# Patient Record
Sex: Female | Born: 1950 | Race: White | Hispanic: No | Marital: Married | Smoking: Former smoker
Health system: Southern US, Community
[De-identification: ages and names within clinical notes are randomized; demographics above are authoritative.]

## PROBLEM LIST (undated history)

## (undated) DIAGNOSIS — E785 Hyperlipidemia, unspecified: Secondary | ICD-10-CM

## (undated) DIAGNOSIS — J189 Pneumonia, unspecified organism: Secondary | ICD-10-CM

## (undated) DIAGNOSIS — I10 Essential (primary) hypertension: Secondary | ICD-10-CM

## (undated) DIAGNOSIS — R002 Palpitations: Secondary | ICD-10-CM

## (undated) DIAGNOSIS — R011 Cardiac murmur, unspecified: Secondary | ICD-10-CM

## (undated) DIAGNOSIS — K219 Gastro-esophageal reflux disease without esophagitis: Secondary | ICD-10-CM

## (undated) DIAGNOSIS — E669 Obesity, unspecified: Secondary | ICD-10-CM

## (undated) DIAGNOSIS — I712 Thoracic aortic aneurysm, without rupture, unspecified: Secondary | ICD-10-CM

## (undated) DIAGNOSIS — R7309 Other abnormal glucose: Secondary | ICD-10-CM

## (undated) DIAGNOSIS — G4700 Insomnia, unspecified: Secondary | ICD-10-CM

## (undated) DIAGNOSIS — I272 Pulmonary hypertension, unspecified: Secondary | ICD-10-CM

## (undated) DIAGNOSIS — M810 Age-related osteoporosis without current pathological fracture: Secondary | ICD-10-CM

## (undated) DIAGNOSIS — T7840XA Allergy, unspecified, initial encounter: Secondary | ICD-10-CM

## (undated) DIAGNOSIS — M7660 Achilles tendinitis, unspecified leg: Secondary | ICD-10-CM

## (undated) DIAGNOSIS — E559 Vitamin D deficiency, unspecified: Secondary | ICD-10-CM

## (undated) DIAGNOSIS — K589 Irritable bowel syndrome without diarrhea: Secondary | ICD-10-CM

## (undated) DIAGNOSIS — M1990 Unspecified osteoarthritis, unspecified site: Secondary | ICD-10-CM

## (undated) DIAGNOSIS — F419 Anxiety disorder, unspecified: Secondary | ICD-10-CM

## (undated) HISTORY — DX: Age-related osteoporosis without current pathological fracture: M81.0

## (undated) HISTORY — DX: Essential (primary) hypertension: I10

## (undated) HISTORY — DX: Irritable bowel syndrome, unspecified: K58.9

## (undated) HISTORY — DX: Anxiety disorder, unspecified: F41.9

## (undated) HISTORY — PX: TUBAL LIGATION: SHX77

## (undated) HISTORY — DX: Palpitations: R00.2

## (undated) HISTORY — DX: Thoracic aortic aneurysm, without rupture, unspecified: I71.20

## (undated) HISTORY — DX: Vitamin D deficiency, unspecified: E55.9

## (undated) HISTORY — DX: Other abnormal glucose: R73.09

## (undated) HISTORY — PX: CHOLECYSTECTOMY: SHX55

## (undated) HISTORY — DX: Achilles tendinitis, unspecified leg: M76.60

## (undated) HISTORY — DX: Insomnia, unspecified: G47.00

## (undated) HISTORY — DX: Pulmonary hypertension, unspecified: I27.20

## (undated) HISTORY — DX: Hyperlipidemia, unspecified: E78.5

## (undated) HISTORY — DX: Allergy, unspecified, initial encounter: T78.40XA

## (undated) HISTORY — DX: Obesity, unspecified: E66.9

---

## 1997-07-29 ENCOUNTER — Emergency Department (HOSPITAL_COMMUNITY): Admission: EM | Admit: 1997-07-29 | Discharge: 1997-07-29 | Payer: Self-pay | Admitting: Emergency Medicine

## 1999-11-24 ENCOUNTER — Other Ambulatory Visit: Admission: RE | Admit: 1999-11-24 | Discharge: 1999-11-24 | Payer: Self-pay | Admitting: *Deleted

## 2000-06-20 ENCOUNTER — Encounter: Payer: Self-pay | Admitting: Vascular Surgery

## 2000-06-22 ENCOUNTER — Encounter (INDEPENDENT_AMBULATORY_CARE_PROVIDER_SITE_OTHER): Payer: Self-pay | Admitting: *Deleted

## 2000-06-22 ENCOUNTER — Ambulatory Visit (HOSPITAL_COMMUNITY): Admission: RE | Admit: 2000-06-22 | Discharge: 2000-06-22 | Payer: Self-pay | Admitting: Vascular Surgery

## 2001-01-26 ENCOUNTER — Other Ambulatory Visit: Admission: RE | Admit: 2001-01-26 | Discharge: 2001-01-26 | Payer: Self-pay | Admitting: Internal Medicine

## 2002-05-03 ENCOUNTER — Other Ambulatory Visit: Admission: RE | Admit: 2002-05-03 | Discharge: 2002-05-03 | Payer: Self-pay | Admitting: Internal Medicine

## 2002-08-03 ENCOUNTER — Ambulatory Visit (HOSPITAL_COMMUNITY): Admission: RE | Admit: 2002-08-03 | Discharge: 2002-08-03 | Payer: Self-pay | Admitting: Internal Medicine

## 2002-08-03 ENCOUNTER — Encounter: Payer: Self-pay | Admitting: Internal Medicine

## 2002-08-27 ENCOUNTER — Other Ambulatory Visit: Admission: RE | Admit: 2002-08-27 | Discharge: 2002-08-27 | Payer: Self-pay | Admitting: Internal Medicine

## 2002-12-16 ENCOUNTER — Encounter: Payer: Self-pay | Admitting: Emergency Medicine

## 2002-12-16 ENCOUNTER — Emergency Department (HOSPITAL_COMMUNITY): Admission: AD | Admit: 2002-12-16 | Discharge: 2002-12-16 | Payer: Self-pay | Admitting: Emergency Medicine

## 2004-07-01 ENCOUNTER — Other Ambulatory Visit: Admission: RE | Admit: 2004-07-01 | Discharge: 2004-07-01 | Payer: Self-pay | Admitting: Internal Medicine

## 2004-08-04 ENCOUNTER — Ambulatory Visit (HOSPITAL_COMMUNITY): Admission: RE | Admit: 2004-08-04 | Discharge: 2004-08-04 | Payer: Self-pay | Admitting: Internal Medicine

## 2004-12-23 ENCOUNTER — Encounter: Admission: RE | Admit: 2004-12-23 | Discharge: 2005-03-23 | Payer: Self-pay | Admitting: Internal Medicine

## 2007-03-21 ENCOUNTER — Other Ambulatory Visit: Admission: RE | Admit: 2007-03-21 | Discharge: 2007-03-21 | Payer: Self-pay | Admitting: Internal Medicine

## 2008-12-23 ENCOUNTER — Emergency Department (HOSPITAL_COMMUNITY): Admission: EM | Admit: 2008-12-23 | Discharge: 2008-12-23 | Payer: Self-pay | Admitting: Emergency Medicine

## 2010-01-13 ENCOUNTER — Other Ambulatory Visit: Admission: RE | Admit: 2010-01-13 | Discharge: 2010-01-13 | Payer: Self-pay | Admitting: Internal Medicine

## 2010-01-19 ENCOUNTER — Encounter (INDEPENDENT_AMBULATORY_CARE_PROVIDER_SITE_OTHER): Payer: Self-pay | Admitting: *Deleted

## 2010-04-25 ENCOUNTER — Encounter: Payer: Self-pay | Admitting: Internal Medicine

## 2010-05-05 NOTE — Letter (Signed)
Summary: Pre Visit Letter Revised  [facility]  [address] [place], [place] [address]   Phone: [phone]  Fax: [phone]        01/19/2010 MRN: [id number] [facility] [address] [place], [place]  [address]             Procedure Date:  03-06-10   Welcome to the Gastroenterology Division at [facility].    You are scheduled to see a nurse for your pre-procedure visit on 02-20-10 at 10:30a.m. on the 3rd floor at [facility], [address]  We ask that you try to arrive at our office 15 minutes prior to your appointment time to allow for check-in.  Please take a minute to review the attached form.  If you answer "Yes" to one or more of the questions on the first page, we ask that you call the person listed at your earliest opportunity.  If you answer "No" to all of the questions, please complete the rest of the form and bring it to your appointment.    Your nurse visit will consist of discussing your medical and surgical history, your immediate family medical history, and your medications.   If you are unable to list all of your medications on the form, please bring the medication bottles to your appointment and we will list them.  We will need to be aware of both prescribed and over the counter drugs.  We will need to know exact dosage information as well.    Please be prepared to read and sign documents such as consent forms, a financial agreement, and acknowledgement forms.  If necessary, and with your consent, a friend or relative is welcome to sit-in on the nurse visit with you.  Please bring your insurance card so that we may make a copy of it.  If your insurance requires a referral to see a specialist, please bring your referral form from your primary care physician.  No co-pay is required for this nurse visit.     If you cannot keep your appointment, please call [phone] to cancel or reschedule prior to your appointment date.  This  allows [place] the opportunity to schedule an appointment for another patient in need of care.    Thank you for choosing [facility] for your medical needs.  We appreciate the opportunity to care for you.  Please visit [place] at our website  to learn more about our practice.  Sincerely, The Gastroenterology Division

## 2010-08-21 NOTE — Op Note (Signed)
[facility]  Patient:    Claudia [name], Claudia [name]                        MRN: [id number] Proc. Date: 06/22/00 Attending:  Larina Earthly, M.D.                           Operative Report  PREOPERATIVE DIAGNOSIS:  Bilateral tributary varicosities, venous hypertension right leg.  POSTOPERATIVE DIAGNOSIS:  Bilateral tributary varicosities, venous hypertension right leg.  PROCEDURE:  Ligation and stripping of greater saphenous vein from the right groin to the midcalf, followed by bilateral tributary varicosity removal with Trivex system.  SURGEON:  Larina Earthly, M.D.  ASSISTANTS:  Quita Skye. Hart Rochester, M.D., and [facility]  ANESTHESIA:  General endotracheal.  COMPLICATIONS:  None.  DISPOSITION:  To recovery room stable.  DESCRIPTION OF PROCEDURE:  The patient was taken to the operating room and placed in the supine position, where the area of both groins and both legs was prepped and draped in the usual sterile fashion.  Incision was made over the right groin and carried down to isolate the right saphenofemoral junction. Tributary varicosities were ligated at the saphenofemoral junction, and the saphenous vein was, tributary branches were ligated and divided.  The saphenous vein was ligated at the the saphenofemoral junction.  The vein was opened, and a stripper was passed retrograde down to the level of the midcalf. A separate incision was made over this area of the saphenous vein, and the stripper was removed through this area and the vein was ligated distally. Next, using Trivex transillumination, the veins were all removed using transillumination and the Trivex shaver.  The tumescent solution with epinephrine and lidocaine were used for hemostasis and illumination.  Stab incisions were made for removal of tributary branches.  Next, the saphenous vein was removed in its entirety with the vein stripper, and the pressure was used for hemostasis.   The left leg was then addressed.  The patient had tributary varicosities only at the lateral calf, and these were removed with the Trivex.  The wounds were closed with benzoin and Steri-Strips.  The incisions in the right leg for the vein removal were closed with 3-0 Vicryl in the subcutaneous and subcuticular tissue.  Benzoin and Steri-Strips were applied to these as well.  Next, Kerlix and a pressure dressing were applied with Coban.  The patient was transferred to the recovery room in stable condition. DD:  06/22/00 TD:  06/23/00 Job: [id number]

## 2012-08-14 ENCOUNTER — Other Ambulatory Visit (HOSPITAL_COMMUNITY): Payer: Self-pay | Admitting: Internal Medicine

## 2012-08-14 DIAGNOSIS — M949 Disorder of cartilage, unspecified: Secondary | ICD-10-CM

## 2012-08-16 ENCOUNTER — Encounter: Payer: Self-pay | Admitting: Internal Medicine

## 2012-08-24 ENCOUNTER — Other Ambulatory Visit (HOSPITAL_COMMUNITY): Payer: Self-pay | Admitting: Internal Medicine

## 2012-08-24 DIAGNOSIS — Z1231 Encounter for screening mammogram for malignant neoplasm of breast: Secondary | ICD-10-CM

## 2012-10-20 ENCOUNTER — Ambulatory Visit (AMBULATORY_SURGERY_CENTER): Admitting: *Deleted

## 2012-10-20 VITALS — Ht 68.0 in | Wt 261.0 lb

## 2012-10-20 DIAGNOSIS — Z1211 Encounter for screening for malignant neoplasm of colon: Secondary | ICD-10-CM

## 2012-10-20 MED ORDER — MOVIPREP 100 G PO SOLR: ORAL | Status: DC

## 2012-10-20 NOTE — Progress Notes (Signed)
Patient denies any allergies to eggs or soy. 

## 2012-10-20 NOTE — Progress Notes (Signed)
Patient states after dental work years ago the "sedation" made her very anxious.. Patient denies any problems with general anesthesia after her surgeries.

## 2012-10-27 ENCOUNTER — Encounter: Payer: Self-pay | Admitting: Internal Medicine

## 2012-11-03 ENCOUNTER — Encounter: Payer: Self-pay | Admitting: Internal Medicine

## 2012-11-03 ENCOUNTER — Ambulatory Visit (AMBULATORY_SURGERY_CENTER): Admitting: Internal Medicine

## 2012-11-03 VITALS — BP 142/75 | HR 62 | Temp 97.9°F | Resp 11 | Ht 68.0 in | Wt 261.0 lb

## 2012-11-03 DIAGNOSIS — Z1211 Encounter for screening for malignant neoplasm of colon: Secondary | ICD-10-CM

## 2012-11-03 MED ORDER — SODIUM CHLORIDE 0.9 % IV SOLN: 500.0000 mL | INTRAVENOUS | Status: DC

## 2012-11-03 NOTE — Progress Notes (Signed)
Patient did not experience any of the following events: a burn prior to discharge; a fall within the facility; wrong site/side/patient/procedure/implant event; or a hospital transfer or hospital admission upon discharge from the facility. (G8907) Patient did not have preoperative order for IV antibiotic SSI prophylaxis. (G8918)  

## 2012-11-03 NOTE — Op Note (Signed)
[facility] [address] [place], [address]   COLONOSCOPY PROCEDURE REPORT  PATIENT: Claudia [name], Claudia [name]  MR#: [id number] BIRTHDATE: [birth year] , 62  yrs. old GENDER: Female ENDOSCOPIST: Hart Carwin, MD REFERRED [id number] Oneta Rack, M.D. PROCEDURE DATE:  11/03/2012 PROCEDURE:   Colonoscopy, screening First Screening Colonoscopy - Avg.  risk and is 50 yrs.  old or older Yes.  Prior Negative Screening - Now for repeat screening. N/A  History of Adenoma - Now for follow-up colonoscopy & has been > or = to 3 yrs.  N/A ASA CLASS:   Class II INDICATIONS:Average risk patient for colon cancer. MEDICATIONS: MAC sedation, administered by CRNA and Propofol (Diprivan) 260 mg IV  DESCRIPTION OF PROCEDURE:   After the risks benefits and alternatives of the procedure were thoroughly explained, informed consent was obtained.  A digital rectal exam revealed no abnormalities of the rectum.   The LB [id number]-[id number] and LB [id number]  endoscope was introduced through the anus and advanced to the cecum, which was identified by both the appendix and ileocecal valve. No adverse events experienced.   The quality of the prep was excellent, using MoviPrep  The instrument was then slowly withdrawn as the colon was fully examined.      COLON FINDINGS: A normal appearing cecum, ileocecal valve, and appendiceal orifice were identified.  The ascending, hepatic flexure, transverse, splenic flexure, descending, sigmoid colon and rectum appeared unremarkable.  No polyps or cancers were seen. Mild diverticulosis was noted in the sigmoid colon.  Retroflexed views revealed no abnormalities. The time to cecum=7 minutes 2 seconds.  Withdrawal time=7 minutes 49 seconds.  The scope was withdrawn and the procedure completed. COMPLICATIONS: There were no complications.  ENDOSCOPIC IMPRESSION: 1.   Normal colon 2.   Mild diverticulosis was noted in the sigmoid  colon  RECOMMENDATIONS: High fiber diet   eSigned:  Hart Carwin, MD 11/03/2012 1:03 PM   cc:   PATIENT NAME:  Claudia [name], Claudia [name] MR#: [id number]

## 2012-11-03 NOTE — Progress Notes (Signed)
Report to pacu rn, vss, bbs=clear 

## 2012-11-03 NOTE — Patient Instructions (Addendum)

## 2012-11-06 ENCOUNTER — Telehealth: Payer: Self-pay | Admitting: *Deleted

## 2012-11-06 NOTE — Telephone Encounter (Signed)
  Follow up Call-  Call back number 11/03/2012  Post procedure Call Back phone  # [phone]  Permission to leave phone message Yes     Patient questions:  Left a message to call us if necessary.

## 2013-02-18 ENCOUNTER — Encounter: Payer: Self-pay | Admitting: Internal Medicine

## 2013-02-18 DIAGNOSIS — Z79899 Other long term (current) drug therapy: Secondary | ICD-10-CM | POA: Insufficient documentation

## 2013-02-18 DIAGNOSIS — R7309 Other abnormal glucose: Secondary | ICD-10-CM | POA: Insufficient documentation

## 2013-02-18 DIAGNOSIS — E559 Vitamin D deficiency, unspecified: Secondary | ICD-10-CM | POA: Insufficient documentation

## 2013-02-18 DIAGNOSIS — I10 Essential (primary) hypertension: Secondary | ICD-10-CM | POA: Insufficient documentation

## 2013-02-18 DIAGNOSIS — G4700 Insomnia, unspecified: Secondary | ICD-10-CM | POA: Insufficient documentation

## 2013-02-18 DIAGNOSIS — K589 Irritable bowel syndrome without diarrhea: Secondary | ICD-10-CM | POA: Insufficient documentation

## 2013-02-18 DIAGNOSIS — E785 Hyperlipidemia, unspecified: Secondary | ICD-10-CM | POA: Insufficient documentation

## 2013-02-18 NOTE — Patient Instructions (Addendum)
Continue diet & medications same. As discussed. Further disposition pending lab results.  Hypertension As your heart beats, it forces blood through your arteries. This force is your blood pressure. If the pressure is too high, it is called hypertension (HTN) or high blood pressure. HTN is dangerous because you may have it and not know it. High blood pressure may mean that your heart has to work harder to pump blood. Your arteries may be narrow or stiff. The extra work puts you at risk for heart disease, stroke, and other problems.  Blood pressure consists of two numbers, a higher number over a lower, 110/72, for example. It is stated as "110 over 72." The ideal is below 120 for the top number (systolic) and under 80 for the bottom (diastolic). Write down your blood pressure today. You should pay close attention to your blood pressure if you have certain conditions such as:  Heart failure.  Prior heart attack.  Diabetes  Chronic kidney disease.  Prior stroke.  Multiple risk factors for heart disease. To see if you have HTN, your blood pressure should be measured while you are seated with your arm held at the level of the heart. It should be measured at least twice. A one-time elevated blood pressure reading (especially in the Emergency Department) does not mean that you need treatment. There may be conditions in which the blood pressure is different between your right and left arms. It is important to see your caregiver soon for a recheck. Most people have essential hypertension which means that there is not a specific cause. This type of high blood pressure may be lowered by changing lifestyle factors such as:  Stress.  Smoking.  Lack of exercise.  Excessive weight.  Drug/tobacco/alcohol use.  Eating less salt. Most people do not have symptoms from high blood pressure until it has caused damage to the body. Effective treatment can often prevent, delay or reduce that  damage. TREATMENT  When a cause has been identified, treatment for high blood pressure is directed at the cause. There are a large number of medications to treat HTN. These fall into several categories, and your caregiver will help you select the medicines that are best for you. Medications may have side effects. You should review side effects with your caregiver. If your blood pressure stays high after you have made lifestyle changes or started on medicines,   Your medication(s) may need to be changed.  Other problems may need to be addressed.  Be certain you understand your prescriptions, and know how and when to take your medicine.  Be sure to follow up with your caregiver within the time frame advised (usually within two weeks) to have your blood pressure rechecked and to review your medications.  If you are taking more than one medicine to lower your blood pressure, make sure you know how and at what times they should be taken. Taking two medicines at the same time can result in blood pressure that is too low. SEEK IMMEDIATE MEDICAL CARE IF:  You develop a severe headache, blurred or changing vision, or confusion.  You have unusual weakness or numbness, or a faint feeling.  You have severe chest or abdominal pain, vomiting, or breathing problems. MAKE SURE YOU:   Understand these instructions.  Will watch your condition.  Will get help right away if you are not doing well or get worse. Document Released: 03/22/2005 Document Revised: 06/14/2011 Document Reviewed: 11/10/2007 [facility] Patient Information 2014 [place], [place]. Cholesterol Cholesterol is  a white, waxy, fat-like protein needed by your body in small amounts. The liver makes all the cholesterol you need. It is carried from the liver by the blood through the blood vessels. Deposits (plaque) may build up on blood vessel walls. This makes the arteries narrower and stiffer. Plaque increases the risk for heart attack and  stroke. You cannot feel your cholesterol level even if it is very high. The only way to know is by a blood test to check your lipid (fats) levels. Once you know your cholesterol levels, you should keep a record of the test results. Work with your caregiver to to keep your levels in the desired range. WHAT THE RESULTS MEAN:  Total cholesterol is a rough measure of all the cholesterol in your blood.  LDL is the so-called bad cholesterol. This is the type that deposits cholesterol in the walls of the arteries. You want this level to be low.  HDL is the good cholesterol because it cleans the arteries and carries the LDL away. You want this level to be high.  Triglycerides are fat that the body can either burn for energy or store. High levels are closely linked to heart disease. DESIRED LEVELS:  Total cholesterol below 200.  LDL below 100 for people at risk, below 70 for very high risk.  HDL above 50 is good, above 60 is best.  Triglycerides below 150. HOW TO LOWER YOUR CHOLESTEROL:  Diet.  Choose fish or white meat chicken and [place], roasted or baked. Limit fatty cuts of red meat, fried foods, and processed meats, such as sausage and lunch meat.  Eat lots of fresh fruits and vegetables. Choose whole grains, beans, pasta, potatoes and cereals.  Use only small amounts of olive, corn or canola oils. Avoid butter, mayonnaise, shortening or palm kernel oils. Avoid foods with trans-fats.  Use skim/nonfat milk and low-fat/nonfat yogurt and cheeses. Avoid whole milk, cream, ice cream, egg yolks and cheeses. Healthy desserts include angel food cake, ginger snaps, animal crackers, hard candy, popsicles, and low-fat/nonfat frozen yogurt. Avoid pastries, cakes, pies and cookies.  Exercise.  A regular program helps decrease LDL and raises HDL.  Helps with weight control.  Do things that increase your activity level like gardening, walking, or taking the stairs.  Medication.  May be  prescribed by your caregiver to help lowering cholesterol and the risk for heart disease.  You may need medicine even if your levels are normal if you have several risk factors. HOME CARE INSTRUCTIONS   Follow your diet and exercise programs as suggested by your caregiver.  Take medications as directed.  Have blood work done when your caregiver feels it is necessary. MAKE SURE YOU:   Understand these instructions.  Will watch your condition.  Will get help right away if you are not doing well or get worse. Document Released: 12/15/2000 Document Revised: 06/14/2011 Document Reviewed: 06/07/2007 [facility] Patient Information 2014 [place], [place].   Vitamin D Deficiency Vitamin D is an important vitamin that your body needs. Having too little of it in your body is called a deficiency. A very bad deficiency can make your bones soft and can cause a condition called rickets.  Vitamin D is important to your body for different reasons, such as:   It helps your body absorb 2 minerals called calcium and phosphorus.  It helps make your bones healthy.  It may prevent some diseases, such as diabetes and multiple sclerosis.  It helps your muscles and heart. You can get  vitamin D in several ways. It is a natural part of some foods. The vitamin is also added to some dairy products and cereals. Some people take vitamin D supplements. Also, your body makes vitamin D when you are in the sun. It changes the sun's rays into a form of the vitamin that your body can use. CAUSES   Not eating enough foods that contain vitamin D.  Not getting enough sunlight.  Having certain digestive system diseases that make it hard to absorb vitamin D. These diseases include Crohn's disease, chronic pancreatitis, and cystic fibrosis.  Having a surgery in which part of the stomach or small intestine is removed.  Being obese. Fat cells pull vitamin D out of your blood. That means that obese people may not have  enough vitamin D left in their blood and in other body tissues.  Having chronic kidney or liver disease. RISK FACTORS Risk factors are things that make you more likely to develop a vitamin D deficiency. They include:  Being older.  Not being able to get outside very much.  Living in a nursing home.  Having had broken bones.  Having weak or thin bones (osteoporosis).  Having a disease or condition that changes how your body absorbs vitamin D.  Having dark skin.  Some medicines such as seizure medicines or steroids.  Being overweight or obese. SYMPTOMS Mild cases of vitamin D deficiency may not have any symptoms. If you have a very bad case, symptoms may include:  Bone pain.  Muscle pain.  Falling often.  Broken bones caused by a minor injury, due to osteoporosis. DIAGNOSIS A blood test is the best way to tell if you have a vitamin D deficiency. TREATMENT Vitamin D deficiency can be treated in different ways. Treatment for vitamin D deficiency depends on what is causing it. Options include:  Taking vitamin D supplements.  Taking a calcium supplement. Your caregiver will suggest what dose is best for you. HOME CARE INSTRUCTIONS  Take any supplements that your caregiver prescribes. Follow the directions carefully. Take only the suggested amount.  Have your blood tested 2 months after you start taking supplements.  Eat foods that contain vitamin D. Healthy choices include:  Fortified dairy products, cereals, or juices. Fortified means vitamin D has been added to the food. Check the label on the package to be sure.  Fatty fish like salmon or trout.  Eggs.  Oysters.  Do not use a tanning bed.  Keep your weight at a healthy level. Lose weight if you need to.  Keep all follow-up appointments. Your caregiver will need to perform blood tests to make sure your vitamin D deficiency is going away. SEEK MEDICAL CARE IF:  You have any questions about your  treatment.  You continue to have symptoms of vitamin D deficiency.  You have nausea or vomiting.  You are constipated.  You feel confused.  You have severe abdominal or back pain. MAKE SURE YOU:  Understand these instructions.  Will watch your condition.  Will get help right away if you are not doing well or get worse. Document Released: 06/14/2011 Document Revised: 07/17/2012 Document Reviewed: 06/14/2011 [facility] Patient Information 2014 [place], [place]. Calorie Counting Diet A calorie counting diet requires you to eat the number of calories that are right for you in a day. Calories are the measurement of how much energy you get from the food you eat. Eating the right amount of calories is important for staying at a healthy weight. If you  eat too many calories, your body will store them as fat and you may gain weight. If you eat too few calories, you may lose weight. Counting the number of calories you eat during a day will help you know if you are eating the right amount. A Registered Dietitian can determine how many calories you need in a day. The amount of calories needed varies from person to person. If your goal is to lose weight, you will need to eat fewer calories. Losing weight can benefit you if you are overweight or have health problems such as heart disease, high blood pressure, or diabetes. If your goal is to gain weight, you will need to eat more calories. Gaining weight may be necessary if you have a certain health problem that causes your body to need more energy. TIPS Whether you are increasing or decreasing the number of calories you eat during a day, it may be hard to get used to changes in what you eat and drink. The following are tips to help you keep track of the number of calories you eat. Measure foods at home with measuring cups. This helps you know the amount of food and number of calories you are eating. Restaurants often serve food in amounts that are larger  than 1 serving. While eating out, estimate how many servings of a food you are given. For example, a serving of cooked rice is  cup or about the size of half of a fist. Knowing serving sizes will help you be aware of how much food you are eating at restaurants. Ask for smaller portion sizes or child-size portions at restaurants. Plan to eat half of a meal at a restaurant. Take the rest home or share the other half with a friend. Read the Nutrition Facts panel on food labels for calorie content and serving size. You can find out how many servings are in a package, the size of a serving, and the number of calories each serving has. For example, a package might contain 3 cookies. The Nutrition Facts panel on that package says that 1 serving is 1 cookie. Below that, it will say there are 3 servings in the container. The calories section of the Nutrition Facts label says there are 90 calories. This means there are 90 calories in 1 cookie (1 serving). If you eat 1 cookie you have eaten 90 calories. If you eat all 3 cookies, you have eaten 270 calories (3 servings x 90 calories = 270 calories). The list below tells you how big or small some common portion sizes are. 1 oz.........4 stacked dice. 3 oz........Marland KitchenDeck of cards. 1 tsp.......Marland KitchenTip of little finger. 1 tbs......Marland KitchenMarland KitchenThumb. 2 tbs.......Marland KitchenGolf ball.  cup......Marland KitchenHalf of a fist. 1 cup.......Marland KitchenA fist. KEEP A FOOD LOG Write down every food item you eat, the amount you eat, and the number of calories in each food you eat during the day. At the end of the day, you can add up the total number of calories you have eaten. It may help to keep a list like the one below. Find out the calorie information by reading the Nutrition Facts panel on food labels. Breakfast Bran cereal (1 cup, 110 calories). Fat-free milk ( cup, 45 calories). Snack Apple (1 medium, 80 calories). Lunch Spinach (1 cup, 20 calories). Tomato ( medium, 20 calories). Chicken breast strips (3  oz, 165 calories). Shredded cheddar cheese ( cup, 110 calories). Light [place] dressing (2 tbs, 60 calories). Whole-wheat bread (1 slice, 80 calories). Tub margarine (1  tsp, 35 calories). Vegetable soup (1 cup, 160 calories). Dinner Pork chop (3 oz, 190 calories). Brown rice (1 cup, 215 calories). Steamed broccoli ( cup, 20 calories). Strawberries (1  cup, 65 calories). Whipped cream (1 tbs, 50 calories). Daily Calorie Total: 1425 Document Released: 03/22/2005 Document Revised: 06/14/2011 Document Reviewed: 09/16/2006 [facility] Patient Information 2014 [place], [place]. Exercise to Lose Weight Exercise and a healthy diet may help you lose weight. Your doctor may suggest specific exercises. EXERCISE IDEAS AND TIPS Choose low-cost things you enjoy doing, such as walking, bicycling, or exercising to workout videos. Take stairs instead of the elevator. Walk during your lunch break. Park your car further away from work or school. Go to a gym or an exercise class. Start with 5 to 10 minutes of exercise each day. Build up to 30 minutes of exercise 4 to 6 days a week. Wear shoes with good support and comfortable clothes. Stretch before and after working out. Work out until you breathe harder and your heart beats faster. Drink extra water when you exercise. Do not do so much that you hurt yourself, feel dizzy, or get very short of breath. Exercises that burn about 150 calories: Running 1  miles in 15 minutes. Playing volleyball for 45 to 60 minutes. Washing and waxing a car for 45 to 60 minutes. Playing touch football for 45 minutes. Walking 1  miles in 35 minutes. Pushing a stroller 1  miles in 30 minutes. Playing basketball for 30 minutes. Raking leaves for 30 minutes. Bicycling 5 miles in 30 minutes. Walking 2 miles in 30 minutes. Dancing for 30 minutes. Shoveling snow for 15 minutes. Swimming laps for 20 minutes. Walking up stairs for 15 minutes. Bicycling 4 miles in 15  minutes. Gardening for 30 to 45 minutes. Jumping rope for 15 minutes. Washing windows or floors for 45 to 60 minutes. Document Released: 04/24/2010 Document Revised: 06/14/2011 Document Reviewed: 04/24/2010 [facility] Patient Information 2014 [place], [place].

## 2013-02-19 ENCOUNTER — Ambulatory Visit: Admitting: Internal Medicine

## 2013-02-19 ENCOUNTER — Encounter: Payer: Self-pay | Admitting: Internal Medicine

## 2013-02-19 VITALS — BP 128/88 | HR 72 | Temp 97.9°F | Resp 16 | Wt 252.2 lb

## 2013-02-19 DIAGNOSIS — R7309 Other abnormal glucose: Secondary | ICD-10-CM

## 2013-02-19 DIAGNOSIS — E782 Mixed hyperlipidemia: Secondary | ICD-10-CM

## 2013-02-19 DIAGNOSIS — Z79899 Other long term (current) drug therapy: Secondary | ICD-10-CM

## 2013-02-19 DIAGNOSIS — I10 Essential (primary) hypertension: Secondary | ICD-10-CM

## 2013-02-19 DIAGNOSIS — E559 Vitamin D deficiency, unspecified: Secondary | ICD-10-CM

## 2013-02-19 LAB — CBC WITH DIFFERENTIAL/PLATELET
Eosinophils Absolute: 0.1 10*3/uL (ref 0.0–0.7)
Hemoglobin: 12.9 g/dL (ref 12.0–15.0)
Lymphocytes Relative: 28 % (ref 12–46)
Lymphs Abs: 2 10*3/uL (ref 0.7–4.0)
MCH: 29.6 pg (ref 26.0–34.0)
Monocytes Relative: 9 % (ref 3–12)
Neutrophils Relative %: 60 % (ref 43–77)
RBC: 4.36 MIL/uL (ref 3.87–5.11)
WBC: 7 10*3/uL (ref 4.0–10.5)

## 2013-02-19 LAB — MAGNESIUM: Magnesium: 1.9 mg/dL (ref 1.5–2.5)

## 2013-02-19 LAB — BASIC METABOLIC PANEL WITH GFR
CO2: 26 mEq/L (ref 19–32)
Chloride: 102 mEq/L (ref 96–112)
Glucose, Bld: 103 mg/dL — ABNORMAL HIGH (ref 70–99)
Potassium: 4.2 mEq/L (ref 3.5–5.3)
Sodium: 137 mEq/L (ref 135–145)

## 2013-02-19 LAB — HEPATIC FUNCTION PANEL
ALT: 13 U/L (ref 0–35)
AST: 17 U/L (ref 0–37)
Alkaline Phosphatase: 54 U/L (ref 39–117)
Bilirubin, Direct: 0.1 mg/dL (ref 0.0–0.3)
Indirect Bilirubin: 0.6 mg/dL (ref 0.0–0.9)
Total Bilirubin: 0.7 mg/dL (ref 0.3–1.2)

## 2013-02-19 LAB — HEMOGLOBIN A1C
Hgb A1c MFr Bld: 5.8 % — ABNORMAL HIGH (ref <5.7)
Mean Plasma Glucose: 120 mg/dL — ABNORMAL HIGH (ref <117)

## 2013-02-19 LAB — LIPID PANEL
LDL Cholesterol: 45 mg/dL (ref 0–99)
Total CHOL/HDL Ratio: 2.8 Ratio
VLDL: 48 mg/dL — ABNORMAL HIGH (ref 0–40)

## 2013-02-19 NOTE — Progress Notes (Signed)
Patient ID: Claudia [name], female   DOB: 1950-04-10, 62 y.o.   MRN: [id number]   This very nice 62 yo MWF presents for 3 month follow up with hypertension, hyperlipidemia, pre-diabetes and vitamin D deficiency.    BP has been controlled at home. Today's BP is 128/88. Patient denies any cardiac type chest pain, palpitations, dyspnea, dizziness, claudication, or dependent edema.   Hyperlipidemia is controlled with diet & supplements. She has lost about 10 lb.s over the last year with improved dietary habits.  Last cholesterol was 138 and LDL  56. Patient denies myalgias or other med SE's.    Also, the patient has history of prediabetes with last A1c of 5.7% in may. Patient denies any symptoms of reactive hypoglycemia, diabetic polys, paresthesias or visual blurring.   Further, Patient has history of vitamin D deficiency with last vitamin D of 67. Patient supplements vitamin without any suspected side-effects.  Current Outpatient Prescriptions on File Prior to Visit  Medication Sig Dispense Refill  . aspirin 81 MG tablet Take 81 mg by mouth daily.      . celecoxib (CELEBREX) 200 MG capsule Take 200 mg by mouth daily.      . Cholecalciferol (VITAMIN D-3 PO) Take 2 tablets by mouth daily. Take 6,000 IU daily.      Marland Kitchen CINNAMON PO Take 1,000 mg by mouth 2 (two) times daily.      Marland Kitchen estrogen, conjugated,-medroxyprogesterone (PREMPRO) 0.625-2.5 MG per tablet Take 1 tablet by mouth daily.      . Flaxseed, Linseed, (FLAX SEEDS PO) Take 1,000 mg by mouth 2 (two) times daily.      . hydrochlorothiazide (HYDRODIURIL) 25 MG tablet Take 25 mg by mouth daily.      Marland Kitchen levocetirizine (XYZAL) 5 MG tablet Take 5 mg by mouth every evening.      . montelukast (SINGULAIR) 10 MG tablet Take 10 mg by mouth.      . quinapril (ACCUPRIL) 40 MG tablet Take 40 mg by mouth daily.      . vitamin B-12 (CYANOCOBALAMIN) 1000 MCG tablet Take 1,000 mcg by mouth daily.        Allergies  Allergen Reactions  . Ciprofloxacin   .  Penicillins Other (See Comments)    Had as a child=unknown reaction  . Zoloft [Sertraline Hcl]    PMHx:   Past Medical History  Diagnosis Date  . Hypertension   . Hyperlipidemia   . Elevated hemoglobin A1c   . Vitamin D deficiency   . IBS (irritable bowel syndrome)   . Allergy   . Anxiety   . Obesity   . Insomnia    FHx:    Reviewed / unchanged  SHx:    Reviewed / unchanged  Systems Review:  Constitutional: Denies fever, chills, wt changes, headaches, insomnia, fatigue, night sweats, change in appetite. Eyes: Denies redness, blurred vision, diplopia, discharge, itchy, watery eyes.  ENT: Denies discharge, congestion, post nasal drip, epistaxis, sore throat, earache, hearing loss, dental pain, tinnitus, vertigo, sinus pain, snoring.  CV: Denies chest pain, palpitations, irregular heartbeat, syncope, dyspnea, diaphoresis, orthopnea, PND, claudication, edema. Respiratory: denies cough, dyspnea, DOE, pleurisy, hoarseness, laryngitis, wheezing.  Gastrointestinal: Denies dysphagia, odynophagia, heartburn, reflux, water brash, abdominal pain or cramps, nausea, vomiting, bloating, diarrhea, constipation, hematemesis, melena, hematochezia,  Hemorrhoids. Genitourinary: Denies dysuria, frequency, urgency, nocturia, hesitancy, discharge, hematuria, flank pain. Musculoskeletal: Denies arthralgias, myalgias, stiffness, jt. swelling, but does have long-standing pain rt. Achilles tendonitis for which she had been followed by Dr Lestine Box who  has apparently moved from [place].  Skin: Denies pruritus, rash, hives, warts, acne, eczema, change in skin lesion(s). Neuro: No weakness, tremor, incoordination, spasms, paresthesia, or pain. Psychiatric: Denies confusion, memory loss, or sensory loss. Endo: Denies change in weight, skin, hair change.  Heme/Lymph: No excessive bleeding, bruising, orenlarged lymph nodes.  Filed Vitals:   02/19/13 1018  BP: 128/88  Pulse: 72  Temp: 97.9 F (36.6 C)   Resp: 16    Estimated body mass index is 38.36 kg/(m^2) as calculated from the following:   Height as of 11/03/12: 5\' 8"  (1.727 m).   Weight as of this encounter: 252 lb 3.2 oz (114.397 kg).  On Exam: Appears well nourished - in no distress. Eyes: PERRLA, EOMs, conjunctiva no swelling or erythema. Sinuses: No frontal/maxillary tenderness ENT/Mouth: EAC's clear, TM's nl w/o erythema, bulging. Nares clear w/o erythema, swelling, exudates. Oropharynx clear without erythema or exudates. Oral hygiene is good. Tongue normal, non obstructing. Hearing intact.  Neck: Supple. Thyroid nl. Car 2+/2+ without bruits, nodes or JVD. Chest: Respirations nl with BS clear & equal w/o rales, rhonchi, wheezing or stridor.  Cor: Heart sounds normal w/ regular rate and rhythm without sig. murmurs, gallops, clicks, or rubs. Peripheral pulses normal and equal  without edema.  Abdomen: Soft & bowel sounds normal. Non-tender w/o guarding, rebound, hernias, masses, or organomegaly.  Lymphatics: Unremarkable.  Musculoskeletal: Full ROM all peripheral extremities, joint stability, 5/5 strength, and normal gait.  Skin: Warm, dry without exposed rashes, lesions, ecchymosis apparent.  Neuro: Cranial nerves intact, reflexes equal bilaterally. Sensory-motor testing grossly intact. Tendon reflexes grossly intact.  Pysch: Alert & oriented x 3. Insight and judgement nl & appropriate. No ideations.  Assessment and Plan:  1. Hypertension - Continue monitor blood pressure at home. Continue diet/meds same.  2. Hyperlipidemia - Continue diet/meds, exercise,& lifestyle modifications. Continue monitor periodic cholesterol/liver & renal functions   3. Pre-diabetes/Insulin Resistance - Continue diet, exercise, lifestyle modifications. Monitor appropriate labs.  4. Vitamin D Deficiency - Continue supplementation.  Further disposition pending results of labs.

## 2013-02-20 LAB — VITAMIN D 25 HYDROXY (VIT D DEFICIENCY, FRACTURES): Vit D, 25-Hydroxy: 84 ng/mL (ref 30–89)

## 2013-03-21 ENCOUNTER — Other Ambulatory Visit: Payer: Self-pay | Admitting: Internal Medicine

## 2013-03-21 DIAGNOSIS — M1711 Unilateral primary osteoarthritis, right knee: Secondary | ICD-10-CM

## 2013-03-21 MED ORDER — PREDNISONE 20 MG PO TABS: 20.0000 mg | ORAL_TABLET | ORAL | Status: DC

## 2013-05-17 ENCOUNTER — Other Ambulatory Visit: Payer: Self-pay | Admitting: Internal Medicine

## 2013-05-17 DIAGNOSIS — R197 Diarrhea, unspecified: Secondary | ICD-10-CM

## 2013-05-17 MED ORDER — DIPHENOXYLATE-ATROPINE 2.5-0.025 MG PO TABS: 1.0000 | ORAL_TABLET | Freq: Four times a day (QID) | ORAL | Status: AC | PRN

## 2013-05-21 ENCOUNTER — Encounter: Payer: Self-pay | Admitting: Podiatry

## 2013-05-21 ENCOUNTER — Ambulatory Visit (INDEPENDENT_AMBULATORY_CARE_PROVIDER_SITE_OTHER)

## 2013-05-21 ENCOUNTER — Ambulatory Visit (INDEPENDENT_AMBULATORY_CARE_PROVIDER_SITE_OTHER): Admitting: Podiatry

## 2013-05-21 DIAGNOSIS — R52 Pain, unspecified: Secondary | ICD-10-CM

## 2013-05-21 DIAGNOSIS — M7660 Achilles tendinitis, unspecified leg: Secondary | ICD-10-CM

## 2013-05-21 DIAGNOSIS — M722 Plantar fascial fibromatosis: Secondary | ICD-10-CM

## 2013-05-21 MED ORDER — TRIAMCINOLONE ACETONIDE 10 MG/ML IJ SUSP
10.0000 mg | Freq: Once | INTRAMUSCULAR | Status: AC
  Administered 2013-05-21: 10 mg

## 2013-05-21 NOTE — Patient Instructions (Signed)

## 2013-05-21 NOTE — Progress Notes (Signed)
   Subjective:    Patient ID: Claudia [name], female    DOB: 31-Mar-1951, 63 y.o.   MRN: [id number]  HPI  '' LT FOOT BOTTOM OF THE HEEL IS BEEN HURTING FOR 6 MONTHS AND RT BACK OF THE HEEL IS SORE.  RT FOOT TREATMENT TRIED SURGERY BOOT AND STRETCHING HELPS WHEN WEARING IT AND LT TRIED NO TREATMENT. This patient relates a history of intermittent treatment primarily for the posterior right heel pain since 2007 with periods of temporary relief. The treatment included physical therapy and immobilization. She would like to know what other alternative treatments are available for the chronic right posterior heel pain.    She is also complaining of left inferior heel pain for the past 6 months without a previous history of treatment for this problem.  Review of Systems  All other systems reviewed and are negative.       Objective:   Physical Exam  White female approximately 265 pounds, 5 foot 8 inches appears orientated x3  Vascular: The DP and PT pulses are 2/4 bilaterally  Neurological: Knee and ankle reflexes equal and reactive bilaterally  Musculoskeletal: the right posterior heel appears prominent and is tender to palpation in around the insertional area of the tendo Achilles. There are no palpable defects in the right tendo Achilles and heel off. the left inferior heel is tender to palpation without a palpable lesions. A right HAV deformity noted.   X-ray report weightbearing right foot  Posterior and inferior heel spurs noted. 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  Radiographic impression no acute bony abnormality noted.   X-ray report weightbearing left foot   HAV deformity with hallux interphalangeous noted. Well-organized posterior and inferior heel spurs noted. A Taylor's bunion noted as well.  Radiographic impression: No acute bony abnormality noted.         Assessment & Plan:   Assessment: Posterior heel spur right with  associated Achilles tendinitis right Plantar fasciitis left  Plan: Discussed treatment options for the right posterior heel pain including continuing physical therapy, immobilization, surgery and EPAT. The patient stated that she did not want surgery and would consider EPAT as an alternative. I made aware that this treatment had a success rate of approximately 50% and required multiple weekly visits and also would use immobilization if she opts for this treatment.  The skin is prepped with alcohol and Betadine and 10 mg of Kenalog mixed with 10 mg of plain Xylocaine and 2.5 mg of plain Marcaine are injected into the inferior heel left for Kenalog injection #1. Information about plantar fasciitis provided to patient. She will contact us if she wishes any further treatment for either Achilles tendinitis right or plantar fasciitis left.

## 2013-05-22 ENCOUNTER — Encounter: Payer: Self-pay | Admitting: Podiatry

## 2013-05-23 ENCOUNTER — Ambulatory Visit: Payer: Self-pay | Admitting: Emergency Medicine

## 2013-06-28 ENCOUNTER — Ambulatory Visit: Payer: Self-pay | Admitting: Emergency Medicine

## 2013-06-29 ENCOUNTER — Ambulatory Visit (INDEPENDENT_AMBULATORY_CARE_PROVIDER_SITE_OTHER): Admitting: Emergency Medicine

## 2013-06-29 VITALS — BP 154/96 | HR 60 | Temp 98.2°F | Resp 16 | Wt 256.6 lb

## 2013-06-29 DIAGNOSIS — I10 Essential (primary) hypertension: Secondary | ICD-10-CM

## 2013-06-29 DIAGNOSIS — R7309 Other abnormal glucose: Secondary | ICD-10-CM

## 2013-06-29 DIAGNOSIS — E782 Mixed hyperlipidemia: Secondary | ICD-10-CM

## 2013-06-29 DIAGNOSIS — R002 Palpitations: Secondary | ICD-10-CM

## 2013-06-29 LAB — BASIC METABOLIC PANEL WITH GFR
BUN: 16 mg/dL (ref 6–23)
CO2: 27 mEq/L (ref 19–32)
Calcium: 9.3 mg/dL (ref 8.4–10.5)
Chloride: 102 mEq/L (ref 96–112)
Creat: 0.86 mg/dL (ref 0.50–1.10)
GFR, EST AFRICAN AMERICAN: 85 mL/min
GFR, Est Non African American: 74 mL/min
Glucose, Bld: 91 mg/dL (ref 70–99)
POTASSIUM: 4.1 meq/L (ref 3.5–5.3)
SODIUM: 139 meq/L (ref 135–145)

## 2013-06-29 LAB — CBC WITH DIFFERENTIAL/PLATELET
BASOS ABS: 0.1 10*3/uL (ref 0.0–0.1)
Basophils Relative: 1 % (ref 0–1)
EOS PCT: 4 % (ref 0–5)
Eosinophils Absolute: 0.3 10*3/uL (ref 0.0–0.7)
HCT: 40.2 % (ref 36.0–46.0)
HEMOGLOBIN: 13.6 g/dL (ref 12.0–15.0)
LYMPHS PCT: 26 % (ref 12–46)
Lymphs Abs: 1.9 10*3/uL (ref 0.7–4.0)
MCH: 29.7 pg (ref 26.0–34.0)
MCHC: 33.8 g/dL (ref 30.0–36.0)
MCV: 87.8 fL (ref 78.0–100.0)
MONO ABS: 0.7 10*3/uL (ref 0.1–1.0)
MONOS PCT: 10 % (ref 3–12)
Neutro Abs: 4.3 10*3/uL (ref 1.7–7.7)
Neutrophils Relative %: 59 % (ref 43–77)
Platelets: 332 10*3/uL (ref 150–400)
RBC: 4.58 MIL/uL (ref 3.87–5.11)
RDW: 13.7 % (ref 11.5–15.5)
WBC: 7.3 10*3/uL (ref 4.0–10.5)

## 2013-06-29 LAB — HEMOGLOBIN A1C
Hgb A1c MFr Bld: 5.6 % (ref <5.7)
Mean Plasma Glucose: 114 mg/dL (ref <117)

## 2013-06-29 LAB — LIPID PANEL
Cholesterol: 150 mg/dL (ref 0–200)
HDL: 63 mg/dL (ref >39)
LDL Cholesterol: 60 mg/dL (ref 0–99)
TRIGLYCERIDES: 135 mg/dL (ref <150)
Total CHOL/HDL Ratio: 2.4 Ratio
VLDL: 27 mg/dL (ref 0–40)

## 2013-06-29 LAB — HEPATIC FUNCTION PANEL
ALBUMIN: 3.8 g/dL (ref 3.5–5.2)
ALK PHOS: 61 U/L (ref 39–117)
ALT: 14 U/L (ref 0–35)
AST: 18 U/L (ref 0–37)
BILIRUBIN INDIRECT: 0.6 mg/dL (ref 0.2–1.2)
Bilirubin, Direct: 0.1 mg/dL (ref 0.0–0.3)
TOTAL PROTEIN: 6.9 g/dL (ref 6.0–8.3)
Total Bilirubin: 0.7 mg/dL (ref 0.2–1.2)

## 2013-06-29 NOTE — Patient Instructions (Signed)
Palpitations  A palpitation is the feeling that your heartbeat is irregular. It may feel like your heart is fluttering or skipping a beat. It may also feel like your heart is beating faster than normal. This is usually not a serious problem. In some cases, you may need more medical tests. HOME CARE  Avoid:  Caffeine in coffee, tea, soft drinks, diet pills, and energy drinks.  Chocolate.  Alcohol.  Stop smoking if you smoke.  Reduce your stress and anxiety. Try:  A method that measures bodily functions so you can learn to control them (biofeedback).  Yoga.  Meditation.  Physical activity such as swimming, jogging, or walking.  Get plenty of rest and sleep. GET HELP RIGHT AWAY IF:   You have chest pain.  You feel short of breath.  You have a very bad headache.  You feel dizzy or pass out (faint).  Your fast or irregular heartbeat continues after 24 hours.  Your palpitations occur more often. MAKE SURE YOU:   Understand these instructions.  Will watch your condition.  Will get help right away if you are not doing well or get worse. Document Released: 12/30/2007 Document Revised: 09/21/2011 Document Reviewed: 05/21/2011 [facility] Patient Information 2014 [place]. We want weight loss that will last so you should lose 1-2 pounds a week.  THAT IS IT! Please pick THREE things a month to change. Once it is a habit check off the item. Then pick another three items off the list to become habits.  If you are already doing a habit on the list GREAT!  Cross that item off! o Don't drink your calories. Ie, alcohol, soda, fruit juice, and sweet tea.  o Drink more water. Drink a glass when you feel hungry or before each meal.  o Eat breakfast - Complex carb and protein (likeDannon light and fit yogurt, oatmeal, fruit, eggs, [place] bacon). o Measure your cereal.  Eat no more than one cup a day. (ie [place]) o Eat an apple a day. o Add a vegetable a day. o Try a new vegetable a  month. o Use Pam! Stop using oil or butter to cook. o Don't finish your plate or use smaller plates. o Share your dessert. o Eat sugar free Jello for dessert or frozen grapes. o Don't eat 2-3 hours before bed. o Switch to whole wheat bread, pasta, and brown rice. o Make healthier choices when you eat out. No fries! o Pick baked chicken, NOT fried. o Don't forget to SLOW DOWN when you eat. It is not going anywhere.  o Take the stairs. o Park far away in the parking lot o [employer] (or weights) for 10 minutes while watching TV. o Walk at work for 10 minutes during break. o Walk outside 1 time a week with your friend, kids, dog, or significant other. o Start a walking group at [place] the mall as much as you can tolerate.  o Keep a food diary. o Weigh yourself daily. o Walk for 15 minutes 3 days per week. o Cook at home more often and eat out less.  If life happens and you go back to old habits, it is okay.  Just start over. You can do it!   If you experience chest pain, get short of breath, or tired during the exercise, please stop immediately and inform your doctor.  Marland Kitchengooo

## 2013-06-29 NOTE — Progress Notes (Addendum)
Subjective:    Patient ID: Stephonie [name], female    DOB: 04-25-1950, 63 y.o.   MRN: [id number]  HPI Comments: 63 yo female presents for 3 month F/U for HTN, Cholesterol, Pre-Dm, D. Deficient. She has not been taking BP at home. She had [place] last p.m. And thinks may have contributed. She has not been eating healthy. She has not been exercising routinely but keeps busy. She notes increased palpitations frequency. She notes belching associated with unusual heart beat. She denies CP/ SOB. She had mild light headiness when it was very frequent x 2 days with last episode 3 weeks ago. She denies any extra stressors.  Last labs CHOL         144   02/19/2013 HDL           51   02/19/2013 LDLCALC       45   02/19/2013 TRIG         238   02/19/2013 CHOLHDL      2.8   02/19/2013 ALT           13   02/19/2013 AST           17   02/19/2013 ALKPHOS       54   02/19/2013 BILITOT      0.7   02/19/2013 CREATININE     0.84   02/19/2013 BUN              19   02/19/2013 NA              137   02/19/2013 K               4.2   02/19/2013 CL              102   02/19/2013 CO2              26   02/19/2013 HGBA1C      5.8   02/19/2013 WBC      7.0   02/19/2013 HGB     12.9   02/19/2013 HCT     38.0   02/19/2013 MCV     87.2   02/19/2013 PLT      327   02/19/2013     Current Outpatient Prescriptions on File Prior to Visit  Medication Sig Dispense Refill  . aspirin 81 MG tablet Take 81 mg by mouth daily.      . celecoxib (CELEBREX) 200 MG capsule Take 200 mg by mouth daily.      . Cholecalciferol (VITAMIN D-3 PO) Take 2 tablets by mouth daily. Take 6,000 IU daily.      Marland Kitchen CINNAMON PO Take 1,000 mg by mouth 2 (two) times daily.      . diphenoxylate-atropine (LOMOTIL) 2.5-0.025 MG per tablet Take 1 tablet by mouth 4 (four) times daily as needed for diarrhea or loose stools.  30 tablet  1  . estrogen, conjugated,-medroxyprogesterone (PREMPRO) 0.625-2.5 MG per tablet Take 1 tablet by mouth daily.      .  Flaxseed, Linseed, (FLAX SEEDS PO) Take 1,000 mg by mouth 2 (two) times daily.      . hydrochlorothiazide (HYDRODIURIL) 25 MG tablet Take 25 mg by mouth daily.      Marland Kitchen levocetirizine (XYZAL) 5 MG tablet Take 5 mg by mouth every evening.      . montelukast (SINGULAIR) 10 MG tablet Take 10 mg by mouth.      . quinapril (ACCUPRIL) 40 MG tablet Take  40 mg by mouth daily.      . vitamin B-12 (CYANOCOBALAMIN) 1000 MCG tablet Take 1,000 mcg by mouth daily.       No current facility-administered medications on file prior to visit.   Allergies  Allergen Reactions  . Ciprofloxacin   . Penicillins Other (See Comments)    Had as a child=unknown reaction  . Zoloft [Sertraline Hcl]    Past Medical History  Diagnosis Date  . Hypertension   . Hyperlipidemia   . Elevated hemoglobin A1c   . Vitamin D deficiency   . IBS (irritable bowel syndrome)   . Allergy   . Anxiety   . Obesity   . Insomnia      Review of Systems  Cardiovascular: Positive for palpitations.  All other systems reviewed and are negative.   BP 154/96  Pulse 60  Temp(Src) 98.2 F (36.8 C)  Resp 16  Wt 256 lb 9.6 oz (116.393 kg)     Objective:   Physical Exam  Nursing note and vitals reviewed. Constitutional: She is oriented to person, place, and time. She appears well-developed and well-nourished. No distress.  HENT:  Head: Normocephalic and atraumatic.  Right Ear: External ear normal.  Left Ear: External ear normal.  Nose: Nose normal.  Mouth/Throat: Oropharynx is clear and moist.  Eyes: Conjunctivae and EOM are normal.  Neck: Normal range of motion. Neck supple. No JVD present. No thyromegaly present.  Cardiovascular: Normal rate, regular rhythm, normal heart sounds and intact distal pulses.   Pulmonary/Chest: Effort normal and breath sounds normal.  Abdominal: Soft. Bowel sounds are normal. She exhibits no distension and no mass. There is no tenderness. There is no rebound and no guarding.  Musculoskeletal:  Normal range of motion. She exhibits no edema and no tenderness.  Lymphadenopathy:    She has no cervical adenopathy.  Neurological: She is alert and oriented to person, place, and time. No cranial nerve deficit.  Skin: Skin is warm and dry. No rash noted. No erythema. No pallor.  Psychiatric: She has a normal mood and affect. Her behavior is normal. Judgment and thought content normal.      EKG NSCSPT WNL      Assessment & Plan:  1.  3 month F/U for HTN, Cholesterol, Pre-Dm, D. Deficient. Needs healthy diet, cardio QD and obtain healthy weight. Check Labs, Check BP if >130/80 call office, Decrease sodium  2. Palpitations- Check labs, if negative ref Cardio if symptoms continue with decrease of possible triggers discussed

## 2013-06-30 LAB — TSH: TSH: 1.324 u[IU]/mL (ref 0.350–4.500)

## 2013-06-30 LAB — INSULIN, FASTING: Insulin fasting, serum: 12 u[IU]/mL (ref 3–28)

## 2013-07-02 ENCOUNTER — Encounter: Payer: Self-pay | Admitting: Emergency Medicine

## 2013-07-02 NOTE — Addendum Note (Signed)
Addended by: Kelby Aline R on: 07/02/2013 08:20 PM   Modules accepted: Orders

## 2013-07-26 ENCOUNTER — Ambulatory Visit (INDEPENDENT_AMBULATORY_CARE_PROVIDER_SITE_OTHER): Admitting: Cardiovascular Disease

## 2013-07-26 ENCOUNTER — Encounter: Payer: Self-pay | Admitting: Cardiovascular Disease

## 2013-07-26 VITALS — BP 146/86 | HR 90 | Ht 68.0 in | Wt 258.2 lb

## 2013-07-26 DIAGNOSIS — E785 Hyperlipidemia, unspecified: Secondary | ICD-10-CM

## 2013-07-26 DIAGNOSIS — I10 Essential (primary) hypertension: Secondary | ICD-10-CM

## 2013-07-26 DIAGNOSIS — R002 Palpitations: Secondary | ICD-10-CM

## 2013-07-26 NOTE — Progress Notes (Signed)
Patient ID: Claudia [name], female   DOB: [birth year], 63 y.o.   MRN: [id number]  63 yo referred by primary for palpitations Retired with with little stress  Rx for BP years.  Not clear well controlled Does not take at home and runs high at doctors.  Has had palpitations for years but infrequent.  Last couple months getting them every day.  Last minutes to hours Skips more than rapid prolonged episodes Associated with GI symptoms and sometimes worse with spicy food and helped with belching.  No chest pain dyspnea or syncope Compliant with meds but takes quinapril at night.  No excess caffeine No other stimulants  No previous structural heart issues.    ROS: Denies fever, malais, weight loss, blurry vision, decreased visual acuity, cough, sputum, SOB, hemoptysis, pleuritic pain, palpitaitons, heartburn, abdominal pain, melena, lower extremity edema, claudication, or rash.  All other systems reviewed and negative   General: Affect appropriate Healthy:  appears stated age 24: normal Neck supple with no adenopathy JVP normal no bruits no thyromegaly Lungs clear with no wheezing and good diaphragmatic motion Heart:  S1/S2 no murmur,rub, gallop or click PMI normal Abdomen: benighn, BS positve, no tenderness, no AAA no bruit.  No HSM or HJR Distal pulses intact with no bruits No edema Neuro non-focal Skin warm and dry No muscular weakness  Medications Current Outpatient Prescriptions  Medication Sig Dispense Refill  . aspirin 81 MG tablet Take 81 mg by mouth daily.      . celecoxib (CELEBREX) 200 MG capsule Take 200 mg by mouth daily.      . Cholecalciferol (VITAMIN D-3 PO) Take 2 tablets by mouth daily. Take 6,000 IU daily.      Marland Kitchen CINNAMON PO Take 1,000 mg by mouth 2 (two) times daily.      . diphenoxylate-atropine (LOMOTIL) 2.5-0.025 MG per tablet Take 1 tablet by mouth 4 (four) times daily as needed for diarrhea or loose stools.  30 tablet  1  . estrogen,  conjugated,-medroxyprogesterone (PREMPRO) 0.625-2.5 MG per tablet Take 1 tablet by mouth daily.      . Flaxseed, Linseed, (FLAX SEEDS PO) Take 1,000 mg by mouth 2 (two) times daily.      . hydrochlorothiazide (HYDRODIURIL) 25 MG tablet Take 25 mg by mouth daily.      Marland Kitchen levocetirizine (XYZAL) 5 MG tablet Take 5 mg by mouth every evening.      . montelukast (SINGULAIR) 10 MG tablet Take 10 mg by mouth.      . quinapril (ACCUPRIL) 40 MG tablet Take 40 mg by mouth daily.      . vitamin B-12 (CYANOCOBALAMIN) 1000 MCG tablet Take 1,000 mcg by mouth daily.       No current facility-administered medications for this visit.    Allergies Ciprofloxacin; Penicillins; and Zoloft  Family History: Family History  Problem Relation Age of Onset  . Heart attack Mother   . Stroke Mother   . Heart disease Mother   . Diabetes Mother   . Macular degeneration Mother   . Heart attack Father   . Alcohol abuse Father   . Ovarian cancer Maternal Grandmother   . Colon cancer Neg Hx     Social History: History   Social History  . Marital Status: Unknown    Spouse Name: N/A    Number of Children: N/A  . Years of Education: N/A   Occupational History  . Not on file.   Social History Main Topics  . Smoking status: Former  Smoker  . Smokeless tobacco: Never Used  . Alcohol Use: Yes     Comment: rarely  . Drug Use: No  . Sexual Activity: Not on file   Other Topics Concern  . Not on file   Social History Narrative  . No narrative on file    Electrocardiogram:  3/27  SR rate 58 T inversion lead 3   Assessment and Plan

## 2013-07-26 NOTE — Patient Instructions (Signed)
Your physician recommends that you continue on your current medications as directed. Please refer to the Current Medication list given to you today.  Your physician has requested that you have an echocardiogram. Echocardiography is a painless test that uses sound waves to create images of your heart. It provides your doctor with information about the size and shape of your heart and how well your heart's chambers and valves are working. This procedure takes approximately one hour. There are no restrictions for this procedure.  Your physician has recommended that you wear a 30 day event monitor. Event monitors are medical devices that record the heart's electrical activity. Doctors most often [place] these monitors to diagnose arrhythmias. Arrhythmias are problems with the speed or rhythm of the heartbeat. The monitor is a small, portable device. You can wear one while you do your normal daily activities. This is usually used to diagnose what is causing palpitations/syncope (passing out).  Your physician recommends that you schedule a follow-up appointment with Dr. [name] after Echo next available appointment.

## 2013-07-26 NOTE — Assessment & Plan Note (Addendum)
Will monitor at home Start taking quinapril in am not at bedtime and HCTZ in afternoon F/U next available

## 2013-07-26 NOTE — Assessment & Plan Note (Signed)
Cholesterol is at goal.  Continue current dose of statin and diet Rx.  No myalgias or side effects.  F/U  LFT's in 6 months. Lab Results  Component Value Date   LDLCALC 60 06/29/2013

## 2013-07-26 NOTE — Assessment & Plan Note (Signed)
Benign sounding but frequent enough to have event monitor  Can add low dose beta blocker if anything detected as this will also help with borderline BP control

## 2013-07-30 ENCOUNTER — Ambulatory Visit: Payer: Self-pay | Admitting: Podiatry

## 2013-07-30 ENCOUNTER — Encounter: Payer: Self-pay | Admitting: Podiatry

## 2013-07-30 VITALS — BP 155/80 | HR 61 | Resp 17 | Ht 68.0 in | Wt 255.0 lb

## 2013-07-30 DIAGNOSIS — M7660 Achilles tendinitis, unspecified leg: Secondary | ICD-10-CM

## 2013-07-30 NOTE — Progress Notes (Signed)
Patient ID: Claudia [name], female   DOB: [birth year], 63 y.o.   MRN: [id number]  Subjective: This patient presents today for EPAT #1  Objective: The posterior right heel is prominent and tender to palpation. There are no palpable defects in the tendo Achilles right.  Assessment: Posterior heel spur right with associated Achilles tendinitis right  Plan: EPAT treatment #1 was administered the posterior heel right: 2500 shocks, 13 MHz, power level I.6. A she tolerated procedure without any evidence of erythema, edema or ecchymosis.  Patient will maintain bent knee stretching 3 times a day x2 minutes. Ice massage posterior right heel when necessary Wear shoe with wedge heels.  Reappoint x7 days for EPAT treatment #2

## 2013-08-08 ENCOUNTER — Ambulatory Visit (HOSPITAL_COMMUNITY): Attending: Cardiology | Admitting: Radiology

## 2013-08-08 ENCOUNTER — Encounter (INDEPENDENT_AMBULATORY_CARE_PROVIDER_SITE_OTHER)

## 2013-08-08 ENCOUNTER — Encounter: Payer: Self-pay | Admitting: *Deleted

## 2013-08-08 DIAGNOSIS — E785 Hyperlipidemia, unspecified: Secondary | ICD-10-CM | POA: Insufficient documentation

## 2013-08-08 DIAGNOSIS — I10 Essential (primary) hypertension: Secondary | ICD-10-CM | POA: Insufficient documentation

## 2013-08-08 DIAGNOSIS — Z87891 Personal history of nicotine dependence: Secondary | ICD-10-CM | POA: Insufficient documentation

## 2013-08-08 DIAGNOSIS — R002 Palpitations: Secondary | ICD-10-CM

## 2013-08-08 NOTE — Progress Notes (Signed)
Echocardiogram performed.  

## 2013-08-08 NOTE — Progress Notes (Signed)
Patient ID: Claudia [name], female   DOB: 1950-06-04, 63 y.o.   MRN: [id number] E-Cardio verite 30 day cardiac event monitor applied to patient.

## 2013-08-09 ENCOUNTER — Ambulatory Visit: Admitting: Podiatry

## 2013-08-09 ENCOUNTER — Encounter: Payer: Self-pay | Admitting: Podiatry

## 2013-08-09 ENCOUNTER — Ambulatory Visit (INDEPENDENT_AMBULATORY_CARE_PROVIDER_SITE_OTHER): Admitting: Podiatry

## 2013-08-09 VITALS — BP 150/86 | HR 64 | Resp 12

## 2013-08-09 DIAGNOSIS — M7660 Achilles tendinitis, unspecified leg: Secondary | ICD-10-CM

## 2013-08-09 NOTE — Progress Notes (Signed)
Patient presents for EPAT #2 today to treat achilles tendonitis right heel. I performed the EPAT treatment today with-  Energy:  1.6 Pulses:  3500 MHz:  11  She tolerated the treatment well. She will return to see Dr. Amalia Hailey in 1 week for EPAT #3

## 2013-08-13 ENCOUNTER — Telehealth: Payer: Self-pay | Admitting: *Deleted

## 2013-08-13 MED ORDER — ATENOLOL 50 MG PO TABS: 50.0000 mg | ORAL_TABLET | Freq: Every day | ORAL | Status: DC

## 2013-08-13 NOTE — Telephone Encounter (Signed)
PT  AWARE OF MONITOR  RESULTS   START  ATENOLOL 50 MG  EVERY DAY   PER  DR NISHAN  CANNOT  RULE OUT   FLUTTER .Adonis Housekeeper

## 2013-08-15 ENCOUNTER — Encounter: Payer: Self-pay | Admitting: Podiatry

## 2013-08-15 ENCOUNTER — Ambulatory Visit (INDEPENDENT_AMBULATORY_CARE_PROVIDER_SITE_OTHER): Payer: Self-pay | Admitting: Podiatry

## 2013-08-15 ENCOUNTER — Ambulatory Visit: Admitting: Podiatry

## 2013-08-15 ENCOUNTER — Encounter: Admitting: Internal Medicine

## 2013-08-15 VITALS — BP 127/60 | HR 67 | Resp 12

## 2013-08-15 DIAGNOSIS — M7660 Achilles tendinitis, unspecified leg: Secondary | ICD-10-CM

## 2013-08-15 NOTE — Progress Notes (Signed)
Patient ID: Claudia [name], female   DOB: Aug 06, 1950, 63 y.o.   MRN: [id number]  Subjective: Patient presents today for EPAT treatment # 3. Says that the right heel is feeling some better. She admits that she has not stretching and regular basis  Objective: Posterior right heel is prominent at the insertional area of the tendo Achilles. She is strong plantarflexion on the right.  Assessment: Achilles tendinitis with some improvement right  Plan: EPAT treatment #3 Energy level: Start 1.6 increasing to 1.8 Megahertz: 13 Pulses,: 3500  Patient tolerated procedure with minimal complaint. She is instructed to bed knee stretches 1 minutes 3 times a day with a shoes on  Reappoint x7 days for a pad treatment #4

## 2013-08-22 ENCOUNTER — Ambulatory Visit (INDEPENDENT_AMBULATORY_CARE_PROVIDER_SITE_OTHER): Admitting: Cardiovascular Disease

## 2013-08-22 ENCOUNTER — Encounter: Payer: Self-pay | Admitting: Cardiovascular Disease

## 2013-08-22 VITALS — BP 142/88 | HR 51 | Ht 68.0 in | Wt 258.8 lb

## 2013-08-22 DIAGNOSIS — E785 Hyperlipidemia, unspecified: Secondary | ICD-10-CM

## 2013-08-22 DIAGNOSIS — R002 Palpitations: Secondary | ICD-10-CM

## 2013-08-22 DIAGNOSIS — I10 Essential (primary) hypertension: Secondary | ICD-10-CM

## 2013-08-22 NOTE — Progress Notes (Signed)
Patient ID: Claudia [name], female   DOB: 1950/06/18, 63 y.o.   MRN: [id number] 63 yo referred by primary for palpitations Retired with with little stress Rx for BP years. Not clear well controlled Does not take at home and runs high at doctors. Has had palpitations for years but infrequent. Last couple months getting them every day. Last minutes to hours Skips more than rapid prolonged episodes Associated with GI symptoms and sometimes worse with spicy food and helped with belching. No chest pain dyspnea or syncope Compliant with meds but takes quinapril at night. No excess caffeine No other stimulants No previous structural heart issues.   Echo 5/15 Impressions:  - Normal LV size and systolic function, EF 78-29%. Mild LV hypertrophy. Moderate diastolic dysfunction. Normal RV size and systolic function. No significant valvular abnormalities. Mildly dilated ascending aorta.  Event monitor with periods fo ? Flutter or SVT  They are short bursts  She was started on atenolol but has some fatigue and muscle weakness after a week and  Is not sure she like it. Palpitations have lessened and certainly are not lasting minutes like they were before     ROS: Denies fever, malais, weight loss, blurry vision, decreased visual acuity, cough, sputum, SOB, hemoptysis, pleuritic pain, palpitaitons, heartburn, abdominal pain, melena, lower extremity edema, claudication, or rash.  All other systems reviewed and negative  General: Affect appropriate Healthy:  appears stated age 63: normal Neck supple with no adenopathy JVP normal no bruits no thyromegaly Lungs clear with no wheezing and good diaphragmatic motion Heart:  S1/S2 no murmur, no rub, gallop or click PMI normal Abdomen: benighn, BS positve, no tenderness, no AAA no bruit.  No HSM or HJR Distal pulses intact with no bruits No edema Neuro non-focal Skin warm and dry No muscular weakness   Current Outpatient Prescriptions  Medication Sig  Dispense Refill  . aspirin 81 MG tablet Take 81 mg by mouth daily.      Marland Kitchen atenolol (TENORMIN) 50 MG tablet Take 1 tablet (50 mg total) by mouth daily.  30 tablet  11  . celecoxib (CELEBREX) 200 MG capsule Take 200 mg by mouth daily.      . Cholecalciferol (VITAMIN D-3 PO) Take 2 tablets by mouth daily. Take 6,000 IU daily.      Marland Kitchen CINNAMON PO Take 1,000 mg by mouth 2 (two) times daily.      . DiphenhydrAMINE HCl (BENADRYL PO) Take by mouth at bedtime. For Sleep      . diphenoxylate-atropine (LOMOTIL) 2.5-0.025 MG per tablet Take 1 tablet by mouth 4 (four) times daily as needed for diarrhea or loose stools.  30 tablet  1  . estrogen, conjugated,-medroxyprogesterone (PREMPRO) 0.625-2.5 MG per tablet Take 1 tablet by mouth daily.      . Flaxseed, Linseed, (FLAX SEEDS PO) Take 1,000 mg by mouth 2 (two) times daily.      . hydrochlorothiazide (HYDRODIURIL) 25 MG tablet Take 25 mg by mouth daily.      Marland Kitchen levocetirizine (XYZAL) 5 MG tablet Take 5 mg by mouth every evening.      . montelukast (SINGULAIR) 10 MG tablet Take 10 mg by mouth.      . quinapril (ACCUPRIL) 40 MG tablet Take 40 mg by mouth daily.      . vitamin B-12 (CYANOCOBALAMIN) 1000 MCG tablet Take 1,000 mcg by mouth daily.       No current facility-administered medications for this visit.    Allergies  Ciprofloxacin; Penicillins; and Zoloft  Electrocardiogram:  SR T wave inversion lead 3    Assessment and Plan

## 2013-08-22 NOTE — Patient Instructions (Signed)
Your physician wants you to follow-up in:   [place] will receive a reminder letter in the mail two months in advance. If you don't receive a letter, please call our office to schedule the follow-up appointment. You have been referred to EP    ASAP  DX  SVT  Your physician recommends that you continue on your current medications as directed. Please refer to the Current Medication list given to you today.

## 2013-08-22 NOTE — Assessment & Plan Note (Signed)
Cholesterol is at goal.  Continue current dose of statin and diet Rx.  No myalgias or side effects.  F/U  LFT's in 6 months. Lab Results  Component Value Date   LDLCALC 60 06/29/2013             

## 2013-08-22 NOTE — Assessment & Plan Note (Signed)
Event monitor with atrial arrhythmia  Symptoms improved with beta blocker somewhat  F/U with EP to discuss further Rx and possible ablation in future

## 2013-08-22 NOTE — Assessment & Plan Note (Signed)
Well controlled.  Continue current medications and low sodium Dash type diet.    

## 2013-08-29 ENCOUNTER — Ambulatory Visit (INDEPENDENT_AMBULATORY_CARE_PROVIDER_SITE_OTHER): Admitting: Podiatry

## 2013-08-29 ENCOUNTER — Institutional Professional Consult (permissible substitution): Admitting: Internal Medicine

## 2013-08-29 ENCOUNTER — Encounter: Payer: Self-pay | Admitting: Podiatry

## 2013-08-29 VITALS — BP 117/71 | HR 105 | Resp 17

## 2013-08-29 DIAGNOSIS — M7660 Achilles tendinitis, unspecified leg: Secondary | ICD-10-CM

## 2013-08-30 NOTE — Progress Notes (Signed)
Patient ID: Claudia [name], female   DOB: 1950/11/18, 63 y.o.   MRN: [id number]  Subjective: Patient presents today for EPAT treatment #4. Starting pain was 8/10 Today pain level is 6/10  Objective: Posterior right heel is prominent and tender to palpation. There are no palpable defects in the tendo Achilles right  Assessment: Achilles tendinitis right with some reduction of pain  Plan: EPAT treatment number # 4 3500 shocks 13 MHz 1.6 power level  Patient tolerated procedure with minimal complaint of discomfort. No evidence of erythema, edema or ecchymosis noted posttreatment.  Patient advised to maintain bent knee stretching. Wear shoes with heels, no flats.  Reappoint x7 days for EPAT  #5

## 2013-09-05 ENCOUNTER — Ambulatory Visit: Admitting: Podiatry

## 2013-09-05 ENCOUNTER — Ambulatory Visit (INDEPENDENT_AMBULATORY_CARE_PROVIDER_SITE_OTHER): Admitting: Podiatry

## 2013-09-05 ENCOUNTER — Encounter: Payer: Self-pay | Admitting: Podiatry

## 2013-09-05 VITALS — BP 128/78 | HR 72 | Resp 16

## 2013-09-05 DIAGNOSIS — M7660 Achilles tendinitis, unspecified leg: Secondary | ICD-10-CM

## 2013-09-05 NOTE — Progress Notes (Signed)
   Subjective:    Patient ID: Claudia [name], female    DOB: 08/21/1950, 63 y.o.   MRN: [id number]  HPI  pt noted an improvement in pain overall, but a more concentrated pain to back/bottom of heel after being on it for a couple hours This patient presents today EPAT treatment #5. Patient has had some reduction of pain in the posterior right heel.   Review of Systems     Objective:   Physical Exam Orientated x3 white female  Musculoskeletal: The posterior right heel is prominent to palpation with palpable tenderness. Plantarflexion right 5 over 5 without a palpable defects in the tendo Achilles right       Assessment & Plan:   Assessment: Improving posterior heel bursitis/Achilles tendinitis right  Plan: EPAT treatment #5   3500 shocks 13 MHz Power level starting at 1.6 increasing to 1.8  Patient tolerated DP EPAT treatment #6 without any evidence of erythema, edema or ecchymosis  Reappoint x7 days EPAT #6

## 2013-09-12 ENCOUNTER — Encounter: Payer: Self-pay | Admitting: Podiatry

## 2013-09-12 ENCOUNTER — Ambulatory Visit: Admitting: Podiatry

## 2013-09-12 ENCOUNTER — Ambulatory Visit (INDEPENDENT_AMBULATORY_CARE_PROVIDER_SITE_OTHER): Admitting: Podiatry

## 2013-09-12 VITALS — BP 112/64 | HR 65 | Resp 12

## 2013-09-12 DIAGNOSIS — M7660 Achilles tendinitis, unspecified leg: Secondary | ICD-10-CM

## 2013-09-13 NOTE — Progress Notes (Signed)
Patient ID: Maxi Carreras, female   DOB: 04-21-1950, 63 y.o.   MRN: [id number]  Subjective: Patient presents after EPAT treatment #5 still complaining of painful posterior right heel. She says that her pain has reduced slightly and is in the range of 5-7/10 since treatment  Objective: Posterior right heel remains prominent and tender to palpation in around the tendo Achilles insertional area  Assessment: Minimal improvement of posterior heel bursitis/Achilles tendinitis right  Plan: EPAT treatment #6 3500 shocks 13 MHz Power level I.6  Patient tolerated procedure satisfactorily. No erythema, edema or skin lesions noted  Patient advised to apply ice to posterior right heel posttreatment Maintain bent knee stretching  Reappoint x7 days for EPAT #7

## 2013-09-19 ENCOUNTER — Ambulatory Visit: Admitting: Podiatry

## 2013-09-19 ENCOUNTER — Encounter: Payer: Self-pay | Admitting: Podiatry

## 2013-09-19 ENCOUNTER — Ambulatory Visit (INDEPENDENT_AMBULATORY_CARE_PROVIDER_SITE_OTHER): Admitting: Podiatry

## 2013-09-19 VITALS — BP 117/56 | HR 60 | Resp 12

## 2013-09-19 DIAGNOSIS — M7660 Achilles tendinitis, unspecified leg: Secondary | ICD-10-CM

## 2013-09-19 NOTE — Progress Notes (Signed)
Patient ID: Claudia [name], female   DOB: [birth year], 63 y.o.   MRN: [id number] Subjective: This patient presents for followup care after EPAT treatment #6.  Objective: Posterior right heel is prominent with palpable tenderness centrally, laterally and inferiorly.  Assessment: Reducing symptoms of posterior heel bursitis/Achilles tendinitis right  Plan: EPAT treatment #7  3500 shocks  13 MHz1 Power level1.6-1.8  Patient tolerated procedure satisfactorily without any erythema, edema noted Advised to apply ice to posterior right heel posttreatment Maintained bent knee stretching  Reappoint x7 days for EPAT treatment #8

## 2013-09-21 ENCOUNTER — Telehealth: Payer: Self-pay | Admitting: *Deleted

## 2013-09-21 NOTE — Telephone Encounter (Signed)
PT  AWARE OF MONITOR RESULTS  PER  DR NISHANN   [facility]  PAC'S   LONG PERIODS OF  FLUTTER/SVT  RATE  AS HIGH AS  161 F/U  WITH EP    PT  HAS  APPT   WITH  DR Rayann Heman ON   09-28-13 AT  11:00 AM .[facility] Housekeeper

## 2013-09-26 ENCOUNTER — Ambulatory Visit (INDEPENDENT_AMBULATORY_CARE_PROVIDER_SITE_OTHER): Admitting: Podiatry

## 2013-09-26 ENCOUNTER — Encounter: Payer: Self-pay | Admitting: Podiatry

## 2013-09-26 ENCOUNTER — Ambulatory Visit: Admitting: Podiatry

## 2013-09-26 VITALS — BP 132/64 | HR 45 | Resp 16 | Ht 68.0 in | Wt 255.0 lb

## 2013-09-26 DIAGNOSIS — M7660 Achilles tendinitis, unspecified leg: Secondary | ICD-10-CM

## 2013-09-26 NOTE — Progress Notes (Signed)
Patient ID: Claudia [name], female   DOB: 10/27/1950, 63 y.o.   MRN: [id number]  Subjective: This patient presents for followup after EPAT treatments x7. The pain has reduced moderately from pre-treatment, however, the pain is not improved in the last several treatments.  Objective: Orientated x 58 63-year-old white female Posterior right heel is prominent and tender to medial lateral palpation. Plantarflexion right 5/5 No palpable defect defects in the right tendo Achilles  Assessment: Moderate reduction of posterior heel bursitis/Achilles tendinitis right  Plan: EPAT #8 3500 shocks 13 MHz Power level I.8  Patient tolerated procedure satisfactorily with minimal complaint of discomfort during procedure. No erythema, edema noted in the posterior right heel.  Maintain stretching and icing the posterior right heel  Reappoint x6 weeks

## 2013-09-27 ENCOUNTER — Other Ambulatory Visit: Payer: Self-pay

## 2013-09-27 MED ORDER — QUINAPRIL HCL 40 MG PO TABS: 40.0000 mg | ORAL_TABLET | Freq: Every day | ORAL | Status: DC

## 2013-09-28 ENCOUNTER — Institutional Professional Consult (permissible substitution): Admitting: Internal Medicine

## 2013-10-12 ENCOUNTER — Other Ambulatory Visit: Payer: Self-pay | Admitting: Internal Medicine

## 2013-10-12 ENCOUNTER — Other Ambulatory Visit: Payer: Self-pay | Admitting: *Deleted

## 2013-10-14 ENCOUNTER — Encounter: Payer: Self-pay | Admitting: Internal Medicine

## 2013-10-14 NOTE — Patient Instructions (Addendum)
Recommend the book "The END of DIETING" by Dr Baker Janus   and the book "The END of DIABETES " by Dr Excell Seltzer  At [email or web address] - get book & Audio CD's      Being diabetic has a  300% increased risk for heart attack, stroke, cancer, and alzheimer- type vascular dementia. It is very important that you work harder with diet by avoiding all foods that are white except chicken & fish. Avoid white rice (brown & wild rice is OK), white potatoes (sweetpotatoes in moderation is OK), White bread or wheat bread or anything made out of white flour like bagels, donuts, rolls, buns, biscuits, cakes, pastries, cookies, pizza crust, and pasta (made from white flour & egg whites) - vegetarian pasta or spinach or wheat pasta is OK. Multigrain breads like Arnold's or [employer], or multigrain sandwich thins or flatbreads.  Diet, exercise and weight loss can reverse and cure diabetes in the early stages.  Diet, exercise and weight loss is very important in the control and prevention of complications of diabetes which affects every system in your body, ie. Brain - dementia/stroke, eyes - glaucoma/blindness, heart - heart attack/heart failure, kidneys - dialysis, stomach - gastric paralysis, intestines - malabsorption, nerves - severe painful neuritis, circulation - gangrene & loss of a leg(s), and finally cancer and Alzheimers.    I recommend avoid fried & greasy foods,  sweets/candy, white rice (brown or wild rice or Quinoa is OK), white potatoes (sweet potatoes are OK) - anything made from white flour - bagels, doughnuts, rolls, buns, biscuits,white and wheat breads, pizza crust and traditional pasta made of white flour & egg white(vegetarian pasta or spinach or wheat pasta is OK).  Multi-grain bread is OK - like multi-grain flat bread or sandwich thins. Avoid alcohol in excess. Exercise is also important.    Eat all the vegetables you want - avoid meat, especially red meat and dairy - especially cheese.  Cheese  is the most concentrated form of trans-fats which is the worst thing to clog up our arteries. Veggie cheese is OK which can be found in the fresh produce section at [employer] or [employer] or [employer]   Preventive Care for Adults A healthy lifestyle and preventive care can promote health and wellness. Preventive health guidelines for women include the following key practices.  A routine yearly physical is a good way to check with your health care provider about your health and preventive screening. It is a chance to share any concerns and updates on your health and to receive a thorough exam.  Visit your dentist for a routine exam and preventive care every 6 months. Brush your teeth twice a day and floss once a day. Good oral hygiene prevents tooth decay and gum disease.  The frequency of eye exams is based on your age, health, family medical history, use of contact lenses, and other factors. Follow your health care provider's recommendations for frequency of eye exams.  Eat a healthy diet. Foods like vegetables, fruits, whole grains, low-fat dairy products, and lean protein foods contain the nutrients you need without too many calories. Decrease your intake of foods high in solid fats, added sugars, and salt. Eat the right amount of calories for you.Get information about a proper diet from your health care provider, if necessary.  Regular physical exercise is one of the most important things you can do for your health. Most adults should get at least 150 minutes of moderate-intensity exercise (any activity that  increases your heart rate and causes you to sweat) each week. In addition, most adults need muscle-strengthening exercises on 2 or more days a week.  Maintain a healthy weight. The body mass index (BMI) is a screening tool to identify possible weight problems. It provides an estimate of body fat based on height and weight. Your health care provider can find your BMI, and can help you  achieve or maintain a healthy weight.For adults 20 years and older:  A BMI below 18.5 is considered underweight.  A BMI of 18.5 to 24.9 is normal.  A BMI of 25 to 29.9 is considered overweight.  A BMI of 30 and above is considered obese.  Maintain normal blood lipids and cholesterol levels by exercising and minimizing your intake of saturated fat. Eat a balanced diet with plenty of fruit and vegetables. Blood tests for lipids and cholesterol should begin at age 14 and be repeated every 5 years. If your lipid or cholesterol levels are high, you are over 50, or you are at high risk for heart disease, you may need your cholesterol levels checked more frequently.Ongoing high lipid and cholesterol levels should be treated with medicines if diet and exercise are not working.  If you smoke, find out from your health care provider how to quit. If you do not use tobacco, do not start.  Lung cancer screening is recommended for adults aged 71-80 years who are at high risk for developing lung cancer because of a history of smoking. A yearly low-dose CT scan of the lungs is recommended for people who have at least a 30-pack-year history of smoking and are a current smoker or have quit within the past 15 years. A pack year of smoking is smoking an average of 1 pack of cigarettes a day for 1 year (for example: 1 pack a day for 30 years or 2 packs a day for 15 years). Yearly screening should continue until the smoker has stopped smoking for at least 15 years. Yearly screening should be stopped for people who develop a health problem that would prevent them from having lung cancer treatment.  If you are pregnant, do not drink alcohol. If you are breastfeeding, be very cautious about drinking alcohol. If you are not pregnant and choose to drink alcohol, do not have more than 1 drink per day. One drink is considered to be 12 ounces (355 mL) of beer, 5 ounces (148 mL) of wine, or 1.5 ounces (44 mL) of liquor.  Avoid  use of street drugs. Do not share needles with anyone. Ask for help if you need support or instructions about stopping the use of drugs.  High blood pressure causes heart disease and increases the risk of stroke. Your blood pressure should be checked at least every 1 to 2 years. Ongoing high blood pressure should be treated with medicines if weight loss and exercise do not work.  If you are 52-9 years old, ask your health care provider if you should take aspirin to prevent strokes.  Diabetes screening involves taking a blood sample to check your fasting blood sugar level. This should be done once every 3 years, after age 31, if you are within normal weight and without risk factors for diabetes. Testing should be considered at a younger age or be carried out more frequently if you are overweight and have at least 1 risk factor for diabetes.  Breast cancer screening is essential preventive care for women. You should practice "breast self-awareness." This means understanding  the normal appearance and feel of your breasts and may include breast self-examination. Any changes detected, no matter how small, should be reported to a health care provider. Women in their 82s and 30s should have a clinical breast exam (CBE) by a health care provider as part of a regular health exam every 1 to 3 years. After age 64, women should have a CBE every year. Starting at age 24, women should consider having a mammogram (breast X-ray test) every year. Women who have a family history of breast cancer should talk to their health care provider about genetic screening. Women at a high risk of breast cancer should talk to their health care providers about having an MRI and a mammogram every year.  Breast cancer gene (BRCA)-related cancer risk assessment is recommended for women who have family members with BRCA-related cancers. BRCA-related cancers include breast, ovarian, tubal, and peritoneal cancers. Having family members with  these cancers may be associated with an increased risk for harmful changes (mutations) in the breast cancer genes BRCA1 and BRCA2. Results of the assessment will determine the need for genetic counseling and BRCA1 and BRCA2 testing.  Routine pelvic exams to screen for cancer are no longer recommended for nonpregnant women who are considered low risk for cancer of the pelvic organs (ovaries, uterus, and vagina) and who do not have symptoms. Ask your health care provider if a screening pelvic exam is right for you.  If you have had past treatment for cervical cancer or a condition that could lead to cancer, you need Pap tests and screening for cancer for at least 20 years after your treatment. If Pap tests have been discontinued, your risk factors (such as having a new sexual partner) need to be reassessed to determine if screening should be resumed. Some women have medical problems that increase the chance of getting cervical cancer. In these cases, your health care provider may recommend more frequent screening and Pap tests.  The HPV test is an additional test that may be used for cervical cancer screening. The HPV test looks for the virus that can cause the cell changes on the cervix. The cells collected during the Pap test can be tested for HPV. The HPV test could be used to screen women aged 77 years and older, and should be used in women of any age who have unclear Pap test results. After the age of 73, women should have HPV testing at the same frequency as a Pap test.  Colorectal cancer can be detected and often prevented. Most routine colorectal cancer screening begins at the age of 31 years and continues through age 34 years. However, your health care provider may recommend screening at an earlier age if you have risk factors for colon cancer. On a yearly basis, your health care provider may provide home test kits to check for hidden blood in the stool. Use of a small camera at the end of a tube, to  directly examine the colon (sigmoidoscopy or colonoscopy), can detect the earliest forms of colorectal cancer. Talk to your health care provider about this at age 62, when routine screening begins. Direct exam of the colon should be repeated every 5-10 years through age 16 years, unless early forms of pre-cancerous polyps or small growths are found.  People who are at an increased risk for hepatitis B should be screened for this virus. You are considered at high risk for hepatitis B if:  You were born in a country where hepatitis B  in a country where hepatitis B occurs often. Talk with your health care provider about which countries are considered high risk.  Your parents were born in a high-risk country and you have not received a shot to protect against hepatitis B (hepatitis B vaccine).  You have HIV or AIDS.  You use needles to inject street drugs.  You live with, or have sex with, someone who has Hepatitis B.  You get hemodialysis treatment.  You take certain medicines for conditions like cancer, organ transplantation, and autoimmune conditions.  Hepatitis C blood testing is recommended for all people born from 1945 through 1965 and any individual with known risks for hepatitis C.  Practice safe sex. Use condoms and avoid high-risk sexual practices to reduce the spread of sexually transmitted infections (STIs). STIs include gonorrhea, chlamydia, syphilis, trichomonas, herpes, HPV, and human immunodeficiency virus (HIV). Herpes, HIV, and HPV are viral illnesses that have no cure. They can result in disability, cancer, and death.  You should be screened for sexually transmitted illnesses (STIs) including gonorrhea and chlamydia if:  You are sexually active and are younger than 24 years.  You are older than 24 years and your health care provider tells you that you are at risk for this type of infection.  Your sexual activity has changed since you were last screened and you are at an increased  risk for chlamydia or gonorrhea. Ask your health care provider if you are at risk.  If you are at risk of being infected with HIV, it is recommended that you take a prescription medicine daily to prevent HIV infection. This is called preexposure prophylaxis (PrEP). You are considered at risk if:  You are a heterosexual woman, are sexually active, and are at increased risk for HIV infection.  You take drugs by injection.  You are sexually active with a partner who has HIV.  Talk with your health care provider about whether you are at high risk of being infected with HIV. If you choose to begin PrEP, you should first be tested for HIV. You should then be tested every 3 months for as long as you are taking PrEP.  Osteoporosis is a disease in which the bones lose minerals and strength with aging. This can result in serious bone fractures or breaks. The risk of osteoporosis can be identified using a bone density scan. Women ages 65 years and over and women at risk for fractures or osteoporosis should discuss screening with their health care providers. Ask your health care provider whether you should take a calcium supplement or vitamin D to reduce the rate of osteoporosis.  Menopause can be associated with physical symptoms and risks. Hormone replacement therapy is available to decrease symptoms and risks. You should talk to your health care provider about whether hormone replacement therapy is right for you.  Use sunscreen. Apply sunscreen liberally and repeatedly throughout the day. You should seek shade when your shadow is shorter than you. Protect yourself by wearing long sleeves, pants, a wide-brimmed hat, and sunglasses year round, whenever you are outdoors.  Once a month, do a whole body skin exam, using a mirror to look at the skin on your back. Tell your health care provider of new moles, moles that have irregular borders, moles that are larger than a pencil eraser, or moles that have changed  in shape or color.  Stay current with required vaccines (immunizations).  Influenza vaccine. All adults should be immunized every year.  Tetanus, diphtheria, and acellular pertussis (  receive 1 dose of Tdap vaccine during each pregnancy. The dose should be obtained regardless of the length of time since the last dose. Immunization is preferred during the 27th-36th week of gestation. An adult who has not previously received Tdap or who does not know her vaccine status should receive 1 dose of Tdap. This initial dose should be followed by tetanus and diphtheria toxoids (Td) booster doses every 10 years. Adults with an unknown or incomplete history of completing a 3-dose immunization series with Td-containing vaccines should begin or complete a primary immunization series including a Tdap dose. Adults should receive a Td booster every 10 years.  Varicella vaccine. An adult without evidence of immunity to varicella should receive 2 doses or a second dose if she has previously received 1 dose. Pregnant females who do not have evidence of immunity should receive the first dose after pregnancy. This first dose should be obtained before leaving the health care facility. The second dose should be obtained 4-8 weeks after the first dose.  Human papillomavirus (HPV) vaccine. Females aged 13-26 years who have not received the vaccine previously should obtain the 3-dose series. The vaccine is not recommended for use in pregnant females. However, pregnancy testing is not needed before receiving a dose. If a female is found to be pregnant after receiving a dose, no treatment is needed. In that case, the remaining doses should be delayed until after the pregnancy. Immunization is recommended for any person with an immunocompromised condition through the age of 61 years if she did not get any or all doses earlier. During the 3-dose series, the second dose should be obtained 4-8 weeks after the  first dose. The third dose should be obtained 24 weeks after the first dose and 16 weeks after the second dose.  Zoster vaccine. One dose is recommended for adults aged 44 years or older unless certain conditions are present.  Measles, mumps, and rubella (MMR) vaccine. Adults born before 55 generally are considered immune to measles and mumps. Adults born in [birth year] or later should have 1 or more doses of MMR vaccine unless there is a contraindication to the vaccine or there is laboratory evidence of immunity to each of the three diseases. A routine second dose of MMR vaccine should be obtained at least 28 days after the first dose for students attending postsecondary schools, health care workers, or international travelers. People who received inactivated measles vaccine or an unknown type of measles vaccine during 1963-1967 should receive 2 doses of MMR vaccine. People who received inactivated mumps vaccine or an unknown type of mumps vaccine before 1979 and are at high risk for mumps infection should consider immunization with 2 doses of MMR vaccine. For females of childbearing age, rubella immunity should be determined. If there is no evidence of immunity, females who are not pregnant should be vaccinated. If there is no evidence of immunity, females who are pregnant should delay immunization until after pregnancy. Unvaccinated health care workers born before [birth year] who lack laboratory evidence of measles, mumps, or rubella immunity or laboratory confirmation of disease should consider measles and mumps immunization with 2 doses of MMR vaccine or rubella immunization with 1 dose of MMR vaccine.  Pneumococcal 13-valent conjugate (PCV13) vaccine. When indicated, a person who is uncertain of her immunization history and has no record of immunization should receive the PCV13 vaccine. An adult aged 77 years or older who has certain medical conditions and has not been previously immunized should receive 1 dose of  PCV13 vaccine. This PCV13 should be followed with a dose of pneumococcal polysaccharide (PPSV23) vaccine. The PPSV23 vaccine dose should be obtained at least 8 weeks after the dose of PCV13 vaccine. An adult aged 66 years or older who has certain medical conditions and previously received 1 or more doses of PPSV23 vaccine should receive 1 dose of PCV13. The PCV13 vaccine dose should be obtained 1 or more years after the last PPSV23 vaccine dose.  Pneumococcal polysaccharide (PPSV23) vaccine. When PCV13 is also indicated, PCV13 should be obtained first. All adults aged 41 years and older should be immunized. An adult younger than age 20 years who has certain medical conditions should be immunized. Any person who resides in a nursing home or long-term care facility should be immunized. An adult smoker should be immunized. People with an immunocompromised condition and certain other conditions should receive both PCV13 and PPSV23 vaccines. People with human immunodeficiency virus (HIV) infection should be immunized as soon as possible after diagnosis. Immunization during chemotherapy or radiation therapy should be avoided. Routine use of PPSV23 vaccine is not recommended for American Indians, [place] Natives, or people younger than 65 years unless there are medical conditions that require PPSV23 vaccine. When indicated, people who have unknown immunization and have no record of immunization should receive PPSV23 vaccine. One-time revaccination 5 years after the first dose of PPSV23 is recommended for people aged 19-64 years who have chronic kidney failure, nephrotic syndrome, asplenia, or immunocompromised conditions. People who received 1-2 doses of PPSV23 before age 33 years should receive another dose of PPSV23 vaccine at age 19 years or later if at least 5 years have passed since the previous dose. Doses of PPSV23 are not needed for people immunized with PPSV23 at or after age 15 years.  Meningococcal vaccine.  Adults with asplenia or persistent complement component deficiencies should receive 2 doses of quadrivalent meningococcal conjugate (MenACWY-D) vaccine. The doses should be obtained at least 2 months apart. Microbiologists working with certain meningococcal bacteria, [place] recruits, people at risk during an outbreak, and people who travel to or live in countries with a high rate of meningitis should be immunized. A first-year college student up through age 52 years who is living in a residence hall should receive a dose if she did not receive a dose on or after her 16th birthday. Adults who have certain high-risk conditions should receive one or more doses of vaccine.  Hepatitis A vaccine. Adults who wish to be protected from this disease, have certain high-risk conditions, work with hepatitis A-infected animals, work in hepatitis A research labs, or travel to or work in countries with a high rate of hepatitis A should be immunized. Adults who were previously unvaccinated and who anticipate close contact with an international adoptee during the first 60 days after arrival in the [place] from a country with a high rate of hepatitis A should be immunized.  Hepatitis B vaccine. Adults who wish to be protected from this disease, have certain high-risk conditions, may be exposed to blood or other infectious body fluids, are household contacts or sex partners of hepatitis B positive people, are clients or workers in certain care facilities, or travel to or work in countries with a high rate of hepatitis B should be immunized.  Haemophilus influenzae type b (Hib) vaccine. A previously unvaccinated person with asplenia or sickle cell disease or having a scheduled splenectomy should receive 1 dose of Hib vaccine. Regardless of previous immunization, a recipient of a hematopoietic stem  immunization, a recipient of a hematopoietic stem cell transplant should receive a 3-dose series 6-12 months after her successful transplant. Hib vaccine is not  recommended for adults with HIV infection. Preventive Services / Frequency Ages 19 to 39years  Blood pressure check.** / Every 1 to 2 years.  Lipid and cholesterol check.** / Every 5 years beginning at age 20.  Clinical breast exam.** / Every 3 years for women in their 20s and 30s.  BRCA-related cancer risk assessment.** / For women who have family members with a BRCA-related cancer (breast, ovarian, tubal, or peritoneal cancers).  Pap test.** / Every 2 years from ages 21 through 29. Every 3 years starting at age 30 through age 65 or 70 with a history of 3 consecutive normal Pap tests.  HPV screening.** / Every 3 years from ages 30 through ages 65 to 70 with a history of 3 consecutive normal Pap tests.  Hepatitis C blood test.** / For any individual with known risks for hepatitis C.  Skin self-exam. / Monthly.  Influenza vaccine. / Every year.  Tetanus, diphtheria, and acellular pertussis (Tdap, Td) vaccine.** / Consult your health care provider. Pregnant women should receive 1 dose of Tdap vaccine during each pregnancy. 1 dose of Td every 10 years.  Varicella vaccine.** / Consult your health care provider. Pregnant females who do not have evidence of immunity should receive the first dose after pregnancy.  HPV vaccine. / 3 doses over 6 months, if 26 and younger. The vaccine is not recommended for use in pregnant females. However, pregnancy testing is not needed before receiving a dose.  Measles, mumps, rubella (MMR) vaccine.** / You need at least 1 dose of MMR if you were born in 1957 or later. You may also need a 2nd dose. For females of childbearing age, rubella immunity should be determined. If there is no evidence of immunity, females who are not pregnant should be vaccinated. If there is no evidence of immunity, females who are pregnant should delay immunization until after pregnancy.  Pneumococcal 13-valent conjugate (PCV13) vaccine.** / Consult your health care  provider.  Pneumococcal polysaccharide (PPSV23) vaccine.** / 1 to 2 doses if you smoke cigarettes or if you have certain conditions.  Meningococcal vaccine.** / 1 dose if you are age 19 to 21 years and a first-year college student living in a residence hall, or have one of several medical conditions, you need to get vaccinated against meningococcal disease. You may also need additional booster doses.  Hepatitis A vaccine.** / Consult your health care provider.  Hepatitis B vaccine.** / Consult your health care provider.  Haemophilus influenzae type b (Hib) vaccine.** / Consult your health care provider. Ages 40 to 64years  Blood pressure check.** / Every 1 to 2 years.  Lipid and cholesterol check.** / Every 5 years beginning at age 20 years.  Lung cancer screening. / Every year if you are aged 55-80 years and have a 30-pack-year history of smoking and currently smoke or have quit within the past 15 years. Yearly screening is stopped once you have quit smoking for at least 15 years or develop a health problem that would prevent you from having lung cancer treatment.  Clinical breast exam.** / Every year after age 40 years.  BRCA-related cancer risk assessment.** / For women who have family members with a BRCA-related cancer (breast, ovarian, tubal, or peritoneal cancers).  Mammogram.** / Every year beginning at age 40 years and continuing for as long as you are in good health. Consult   with your health care provider.  Pap test.** / Every 3 years starting at age 30 years through age 65 or 70 years with a history of 3 consecutive normal Pap tests.  HPV screening.** / Every 3 years from ages 30 years through ages 65 to 70 years with a history of 3 consecutive normal Pap tests.  Fecal occult blood test (FOBT) of stool. / Every year beginning at age 50 years and continuing until age 75 years. You may not need to do this test if you get a colonoscopy every 10 years.  Flexible sigmoidoscopy or  colonoscopy.** / Every 5 years for a flexible sigmoidoscopy or every 10 years for a colonoscopy beginning at age 50 years and continuing until age 75 years.  Hepatitis C blood test.** / For all people born from 1945 through 1965 and any individual with known risks for hepatitis C.  Skin self-exam. / Monthly.  Influenza vaccine. / Every year.  Tetanus, diphtheria, and acellular pertussis (Tdap/Td) vaccine.** / Consult your health care provider. Pregnant women should receive 1 dose of Tdap vaccine during each pregnancy. 1 dose of Td every 10 years.  Varicella vaccine.** / Consult your health care provider. Pregnant females who do not have evidence of immunity should receive the first dose after pregnancy.  Zoster vaccine.** / 1 dose for adults aged 60 years or older.  Measles, mumps, rubella (MMR) vaccine.** / You need at least 1 dose of MMR if you were born in 1957 or later. You may also need a 2nd dose. For females of childbearing age, rubella immunity should be determined. If there is no evidence of immunity, females who are not pregnant should be vaccinated. If there is no evidence of immunity, females who are pregnant should delay immunization until after pregnancy.  Pneumococcal 13-valent conjugate (PCV13) vaccine.** / Consult your health care provider.  Pneumococcal polysaccharide (PPSV23) vaccine.** / 1 to 2 doses if you smoke cigarettes or if you have certain conditions.  Meningococcal vaccine.** / Consult your health care provider.  Hepatitis A vaccine.** / Consult your health care provider.  Hepatitis B vaccine.** / Consult your health care provider.  Haemophilus influenzae type b (Hib) vaccine.** / Consult your health care provider. Ages 65 years and over  Blood pressure check.** / Every 1 to 2 years.  Lipid and cholesterol check.** / Every 5 years beginning at age 20 years.  Lung cancer screening. / Every year if you are aged 55-80 years and have a 30-pack-year history of  smoking and currently smoke or have quit within the past 15 years. Yearly screening is stopped once you have quit smoking for at least 15 years or develop a health problem that would prevent you from having lung cancer treatment.  Clinical breast exam.** / Every year after age 40 years.  BRCA-related cancer risk assessment.** / For women who have family members with a BRCA-related cancer (breast, ovarian, tubal, or peritoneal cancers).  Mammogram.** / Every year beginning at age 40 years and continuing for as long as you are in good health. Consult with your health care provider.  Pap test.** / Every 3 years starting at age 30 years through age 65 or 70 years with 3 consecutive normal Pap tests. Testing can be stopped between 65 and 70 years with 3 consecutive normal Pap tests and no abnormal Pap or HPV tests in the past 10 years.  HPV screening.** / Every 3 years from ages 30 years through ages 65 or 70 years with a history of   3 consecutive normal Pap tests. Testing can be stopped between 65 and 70 years with 3 consecutive normal Pap tests and no abnormal Pap or HPV tests in the past 10 years.  Fecal occult blood test (FOBT) of stool. / Every year beginning at age 50 years and continuing until age 75 years. You may not need to do this test if you get a colonoscopy every 10 years.  Flexible sigmoidoscopy or colonoscopy.** / Every 5 years for a flexible sigmoidoscopy or every 10 years for a colonoscopy beginning at age 50 years and continuing until age 75 years.  Hepatitis C blood test.** / For all people born from 1945 through 1965 and any individual with known risks for hepatitis C.  Osteoporosis screening.** / A one-time screening for women ages 65 years and over and women at risk for fractures or osteoporosis.  Skin self-exam. / Monthly.  Influenza vaccine. / Every year.  Tetanus, diphtheria, and acellular pertussis (Tdap/Td) vaccine.** / 1 dose of Td every 10 years.  Varicella  vaccine.** / Consult your health care provider.  Zoster vaccine.** / 1 dose for adults aged 60 years or older.  Pneumococcal 13-valent conjugate (PCV13) vaccine.** / Consult your health care provider.  Pneumococcal polysaccharide (PPSV23) vaccine.** / 1 dose for all adults aged 65 years and older.  Meningococcal vaccine.** / Consult your health care provider.  Hepatitis A vaccine.** / Consult your health care provider.  Hepatitis B vaccine.** / Consult your health care provider.  Haemophilus influenzae type b (Hib) vaccine.** / Consult your health care provider. ** Family history and personal history of risk and conditions may change your health care provider's recommendations.  

## 2013-10-14 NOTE — Progress Notes (Signed)
Patient ID: Claudia [name], female   DOB: [birth year], 63 y.o.   MRN: [id number]   Annual Screening Comprehensive Examination  This very nice 63 y.o.MWF presents for complete physical.  Patient has been followed for HTN, Prediabetes, Hyperlipidemia, and Vitamin D Deficiency.    Patient has HTN which predates since 32. Patient's BP has been controlled at home. Today's BP: 122/64 mmHg. In 2004 she had a neg Cardiolite with EF 70%. Patient denies any cardiac symptoms as chest pain, shortness of breath, dizziness or ankle swelling. She recently has started on Atenolol by Dr [name] for palpitations attributed to [facility].   Patient's hyperlipidemia is controlled with diet and medications. Patient denies myalgias or other medication SE's. Last cholesterol last visit was at goal as below. Lab Results  Component Value Date   CHOL 150 06/29/2013   HDL 63 06/29/2013   LDLCALC 60 06/29/2013   TRIG 135 06/29/2013   CHOLHDL 2.4 06/29/2013    Patient has prediabetes predating since  2008 with an A1c of 5.8% and last A1c was 5.6% in Mar 2015. Patient denies reactive hypoglycemic symptoms, visual blurring, diabetic polys, or paresthesias.    Finally, patient has history of Vitamin D Deficiency of 18 in 2008 and last Vitamin D was 30 in Nov 2014.   Medication Sig  . aspirin 81 MG tablet Take 81 mg by mouth daily.  Marland Kitchen atenolol  50 MG tablet Take 1 tablet (50 mg total) by mouth daily.  . CELEBREX 200 MG capsule Take 200 mg by mouth daily.  Marland Kitchen VITAMIN D-3 Take 2 tablets by mouth daily. Take 6,000 IU daily.  Marland Kitchen CINNAMON Take 1,000 mg by mouth 2 (two) times daily.  Marland Kitchen BENADRYL  Take by mouth at bedtime. For Sleep  . LOMOTIL 2.5-0.025 MG per tablet Take 1 tablet by mouth 4  times daily as needed .  Marland Kitchen FLAX SEEDS  Take 1,000 mg by mouth 2 (two) times daily.  . hydrochlorothiazide 25 MG tablet TAKE 1 TABLET EVERY DAY  . XYZAL 5 MG tablet TAKE 1 TABLET BY MOUTH EVERY DAY  . montelukast  10 MG tablet TAKE 1 TABLET BY  MOUTH EVERY DAY  . PREMPRO 0.625-2.5 MG per tablet TAKE AS DIRECTED  . quinapril 40 MG tablet Take 1 tablet (40 mg total) by mouth daily.  . vitamin B-12 1000 MCG tablet Take 1,000 mcg by mouth daily.   Allergies  Allergen Reactions  . Ciprofloxacin   . Penicillins Other (See Comments)    Had as a child=unknown reaction  . Zoloft [Sertraline Hcl]    Past Medical History  Diagnosis Date  . Hypertension   . Hyperlipidemia   . Elevated hemoglobin A1c   . Vitamin D deficiency   . IBS (irritable bowel syndrome)   . Allergy   . Anxiety   . Obesity   . Insomnia   . Palpitations    Past Surgical History  Procedure Laterality Date  . Cesarean section    . Cholecystectomy  10 years ago  . Tubal ligation Bilateral     Normal Colonoscopy in 11/2012    . Cesarean section     Family History  Problem Relation Age of Onset  . Heart attack Mother   . Stroke Mother   . Heart disease Mother   . Diabetes Mother   . Macular degeneration Mother   . Heart attack Father   . Alcohol abuse Father   . Ovarian cancer Maternal Grandmother   . Colon cancer Neg  Hx    History  Substance Use Topics  . Smoking status: Former Research scientist (life sciences)  . Smokeless tobacco: Never Used  . Alcohol Use: Yes     Comment: rarely    ROS Constitutional: Denies fever, chills, weight loss/gain, headaches, insomnia, fatigue, night sweats, and change in appetite. Eyes: Denies redness, blurred vision, diplopia, discharge, itchy, watery eyes.  ENT: Denies discharge, congestion, post nasal drip, epistaxis, sore throat, earache, hearing loss, dental pain, Tinnitus, Vertigo, Sinus pain, snoring.  Cardio: Denies chest pain, palpitations, irregular heartbeat, syncope, dyspnea, diaphoresis, orthopnea, PND, claudication, edema Respiratory: denies cough, dyspnea, DOE, pleurisy, hoarseness, laryngitis, wheezing.  Gastrointestinal: Denies dysphagia, heartburn, reflux, water brash, pain, cramps, nausea, vomiting, bloating, diarrhea,  constipation, hematemesis, melena, hematochezia, jaundice, hemorrhoids Genitourinary: Denies dysuria, frequency, urgency, nocturia, hesitancy, discharge, hematuria, flank pain Breast: Breast lumps, nipple discharge, bleeding.  Musculoskeletal: Denies arthralgia, myalgia, stiffness, Jt. Swelling, limp, and strain/sprain. Denies falls. She's recently been evaluated by [facility] for Bilat Heel pains. Skin: Denies puritis, rash, hives, warts, acne, eczema, changing in skin lesion Neuro: No weakness, tremor, incoordination, spasms, paresthesia, pain Psychiatric: Denies confusion, memory loss, sensory loss. Denies Depression. Endocrine: Denies change in weight, skin, hair change, nocturia, and paresthesia, diabetic polys, visual blurring, hyper / hypo glycemic episodes.  Heme/Lymph: No excessive bleeding, bruising, enlarged lymph nodes.  Physical Exam  BP 122/64  Pulse 52  Temp 97.9 F   Resp 16  Ht 5' 7.75"   Wt 257 lb   BMI 39.36 kg/m2  General Appearance: Well nourished and in no apparent distress. Eyes: PERRLA, EOMs, conjunctiva no swelling or erythema, normal fundi and vessels. Sinuses: No frontal/maxillary tenderness ENT/Mouth: EACs patent / TMs  nl. Nares clear without erythema, swelling, mucoid exudates. Oral hygiene is good. No erythema, swelling, or exudate. Tongue normal, non-obstructing. Tonsils not swollen or erythematous. Hearing normal.  Neck: Supple, thyroid normal. No bruits, nodes or JVD. Respiratory: Respiratory effort normal.  BS equal and clear bilateral without rales, rhonci, wheezing or stridor. Cardio: Heart sounds are normal with regular rate and rhythm and no murmurs, rubs or gallops. Peripheral pulses are normal and equal bilaterally without edema. No aortic or femoral bruits. Chest: symmetric with normal excursions and percussion. Breasts: Symmetric, without lumps, nipple discharge, retractions, or fibrocystic changes.  Abdomen: Flat, soft, with bowl sounds.  Nontender, no guarding, rebound, hernias, masses, or organomegaly.  Lymphatics: Non tender without lymphadenopathy.  Genitourinary:  Musculoskeletal: Full ROM all peripheral extremities, joint stability, 5/5 strength, and normal gait. Skin: Warm and dry without rashes, lesions, cyanosis, clubbing or  ecchymosis.  Neuro: Cranial nerves intact, reflexes equal bilaterally. Normal muscle tone, no cerebellar symptoms. Sensation intact.  Pysch: Awake and oriented X 3, normal affect, Insight and Judgment appropriate.   Assessment and Plan  1. Annual Screening Examination 2. Hypertension  3. Hyperlipidemia 4. Pre Diabetes 5. Vitamin D Deficiency  Continue prudent diet as discussed, weight control, BP monitoring, regular exercise, and medications. Discussed med's effects and SE's. Screening labs and tests as requested with regular follow-up as recommended.

## 2013-10-15 ENCOUNTER — Ambulatory Visit (INDEPENDENT_AMBULATORY_CARE_PROVIDER_SITE_OTHER): Admitting: Internal Medicine

## 2013-10-15 ENCOUNTER — Encounter: Payer: Self-pay | Admitting: Internal Medicine

## 2013-10-15 VITALS — BP 122/64 | HR 52 | Temp 97.9°F | Resp 16 | Ht 67.75 in | Wt 257.0 lb

## 2013-10-15 DIAGNOSIS — R7402 Elevation of levels of lactic acid dehydrogenase (LDH): Secondary | ICD-10-CM

## 2013-10-15 DIAGNOSIS — R7401 Elevation of levels of liver transaminase levels: Secondary | ICD-10-CM

## 2013-10-15 DIAGNOSIS — I10 Essential (primary) hypertension: Secondary | ICD-10-CM

## 2013-10-15 DIAGNOSIS — Z79899 Other long term (current) drug therapy: Secondary | ICD-10-CM

## 2013-10-15 DIAGNOSIS — E559 Vitamin D deficiency, unspecified: Secondary | ICD-10-CM

## 2013-10-15 DIAGNOSIS — Z1212 Encounter for screening for malignant neoplasm of rectum: Secondary | ICD-10-CM

## 2013-10-15 DIAGNOSIS — R740 Nonspecific elevation of levels of transaminase and lactic acid dehydrogenase [LDH]: Secondary | ICD-10-CM

## 2013-10-15 DIAGNOSIS — Z111 Encounter for screening for respiratory tuberculosis: Secondary | ICD-10-CM

## 2013-10-15 DIAGNOSIS — Z0000 Encounter for general adult medical examination without abnormal findings: Secondary | ICD-10-CM

## 2013-10-15 DIAGNOSIS — Z113 Encounter for screening for infections with a predominantly sexual mode of transmission: Secondary | ICD-10-CM

## 2013-10-15 LAB — CBC WITH DIFFERENTIAL/PLATELET
BASOS PCT: 1 % (ref 0–1)
Basophils Absolute: 0.1 10*3/uL (ref 0.0–0.1)
Eosinophils Absolute: 0.2 10*3/uL (ref 0.0–0.7)
Eosinophils Relative: 3 % (ref 0–5)
HEMATOCRIT: 37.2 % (ref 36.0–46.0)
HEMOGLOBIN: 12.7 g/dL (ref 12.0–15.0)
LYMPHS ABS: 1.9 10*3/uL (ref 0.7–4.0)
Lymphocytes Relative: 34 % (ref 12–46)
MCH: 29.1 pg (ref 26.0–34.0)
MCHC: 34.1 g/dL (ref 30.0–36.0)
MCV: 85.1 fL (ref 78.0–100.0)
MONO ABS: 0.5 10*3/uL (ref 0.1–1.0)
MONOS PCT: 9 % (ref 3–12)
NEUTROS ABS: 3 10*3/uL (ref 1.7–7.7)
Neutrophils Relative %: 53 % (ref 43–77)
Platelets: 296 10*3/uL (ref 150–400)
RBC: 4.37 MIL/uL (ref 3.87–5.11)
RDW: 13.7 % (ref 11.5–15.5)
WBC: 5.6 10*3/uL (ref 4.0–10.5)

## 2013-10-15 LAB — HEMOGLOBIN A1C
HEMOGLOBIN A1C: 5.5 % (ref <5.7)
MEAN PLASMA GLUCOSE: 111 mg/dL (ref <117)

## 2013-10-15 MED ORDER — PHENTERMINE HCL 37.5 MG PO TABS: ORAL_TABLET | ORAL | Status: DC

## 2013-10-16 LAB — URINALYSIS, MICROSCOPIC ONLY
CRYSTALS: NONE SEEN
Casts: NONE SEEN

## 2013-10-16 LAB — HEPATIC FUNCTION PANEL
ALT: 13 U/L (ref 0–35)
AST: 18 U/L (ref 0–37)
Albumin: 3.8 g/dL (ref 3.5–5.2)
Alkaline Phosphatase: 57 U/L (ref 39–117)
BILIRUBIN DIRECT: 0.1 mg/dL (ref 0.0–0.3)
Indirect Bilirubin: 0.5 mg/dL (ref 0.2–1.2)
TOTAL PROTEIN: 6.7 g/dL (ref 6.0–8.3)
Total Bilirubin: 0.6 mg/dL (ref 0.2–1.2)

## 2013-10-16 LAB — LIPID PANEL
CHOLESTEROL: 155 mg/dL (ref 0–200)
HDL: 59 mg/dL (ref >39)
LDL Cholesterol: 64 mg/dL (ref 0–99)
Total CHOL/HDL Ratio: 2.6 Ratio
Triglycerides: 159 mg/dL — ABNORMAL HIGH (ref <150)
VLDL: 32 mg/dL (ref 0–40)

## 2013-10-16 LAB — BASIC METABOLIC PANEL WITH GFR
BUN: 18 mg/dL (ref 6–23)
CO2: 26 meq/L (ref 19–32)
Calcium: 9 mg/dL (ref 8.4–10.5)
Chloride: 104 mEq/L (ref 96–112)
Creat: 0.81 mg/dL (ref 0.50–1.10)
GFR, Est Non African American: 79 mL/min
GLUCOSE: 109 mg/dL — AB (ref 70–99)
POTASSIUM: 4.1 meq/L (ref 3.5–5.3)
Sodium: 138 mEq/L (ref 135–145)

## 2013-10-16 LAB — HEPATITIS B CORE ANTIBODY, TOTAL: HEP B C TOTAL AB: NONREACTIVE

## 2013-10-16 LAB — MICROALBUMIN / CREATININE URINE RATIO
Creatinine, Urine: 290 mg/dL
MICROALB/CREAT RATIO: 5 mg/g (ref 0.0–30.0)
Microalb, Ur: 1.46 mg/dL (ref 0.00–1.89)

## 2013-10-16 LAB — VITAMIN B12: VITAMIN B 12: 888 pg/mL (ref 211–911)

## 2013-10-16 LAB — HEPATITIS C ANTIBODY: HCV Ab: NEGATIVE

## 2013-10-16 LAB — TSH: TSH: 2.123 u[IU]/mL (ref 0.350–4.500)

## 2013-10-16 LAB — RPR

## 2013-10-16 LAB — HEPATITIS B SURFACE ANTIBODY,QUALITATIVE: Hep B S Ab: NEGATIVE

## 2013-10-16 LAB — HIV ANTIBODY (ROUTINE TESTING W REFLEX): HIV 1&2 Ab, 4th Generation: NONREACTIVE

## 2013-10-16 LAB — VITAMIN D 25 HYDROXY (VIT D DEFICIENCY, FRACTURES): Vit D, 25-Hydroxy: 98 ng/mL — ABNORMAL HIGH (ref 30–89)

## 2013-10-16 LAB — INSULIN, FASTING: Insulin fasting, serum: 29 u[IU]/mL — ABNORMAL HIGH (ref 3–28)

## 2013-10-16 LAB — HEPATITIS A ANTIBODY, TOTAL: Hep A Total Ab: NONREACTIVE

## 2013-10-16 LAB — MAGNESIUM: Magnesium: 1.8 mg/dL (ref 1.5–2.5)

## 2013-10-17 LAB — TB SKIN TEST
Induration: 0 mm
TB Skin Test: NEGATIVE

## 2013-10-17 LAB — HEPATITIS B E ANTIBODY: Hepatitis Be Antibody: NONREACTIVE

## 2013-10-29 ENCOUNTER — Ambulatory Visit (INDEPENDENT_AMBULATORY_CARE_PROVIDER_SITE_OTHER): Admitting: Internal Medicine

## 2013-10-29 ENCOUNTER — Encounter: Payer: Self-pay | Admitting: Internal Medicine

## 2013-10-29 VITALS — BP 142/82 | HR 46 | Ht 68.0 in | Wt 254.2 lb

## 2013-10-29 DIAGNOSIS — I10 Essential (primary) hypertension: Secondary | ICD-10-CM

## 2013-10-29 DIAGNOSIS — E669 Obesity, unspecified: Secondary | ICD-10-CM

## 2013-10-29 DIAGNOSIS — I471 Supraventricular tachycardia, unspecified: Secondary | ICD-10-CM

## 2013-10-29 DIAGNOSIS — I4891 Unspecified atrial fibrillation: Secondary | ICD-10-CM | POA: Insufficient documentation

## 2013-10-29 DIAGNOSIS — I498 Other specified cardiac arrhythmias: Secondary | ICD-10-CM

## 2013-10-29 DIAGNOSIS — I480 Paroxysmal atrial fibrillation: Secondary | ICD-10-CM

## 2013-10-29 DIAGNOSIS — R002 Palpitations: Secondary | ICD-10-CM

## 2013-10-29 HISTORY — DX: Supraventricular tachycardia: I47.1

## 2013-10-29 HISTORY — DX: Supraventricular tachycardia, unspecified: I47.10

## 2013-10-29 NOTE — Patient Instructions (Signed)
Your physician recommends that you schedule a follow-up appointment in: 3 months with Roderic Palau, NP

## 2013-10-29 NOTE — Progress Notes (Signed)
Primary Care Physician: Alesia Richards, MD Referring Physician:  Dr Marylene Land Claudia is a 63 y.o. [name] with a h/o htn, obesity, and palpitations who presents today for EP consultation.  She reports having intermittent palpitations for "years".  This past spring, after eating a heavy meal, she had irregular palpitations for about 2 days.  She has tried to eat healthier and feels that this has improved her palpitations.  Typically her palpitations last only a few seconds.  Episodes occur every few days.  She has been placed on atenolol which has improved her symptoms.  She is currently tolerating atenolol without difficulty. She wore an event monitor in May.  This documented frequent pacs as well as rare nonsustained atrial tachycardia.  She also has had short episodes of rate controlled afib.  There is a more regular and prolonged SVT which is likely atrial flutter.  She reports that she did have her typical palpitations during these episodes.  Today, she denies symptoms of chest pain, shortness of breath, orthopnea, PND, lower extremity edema, dizziness, presyncope, syncope, or neurologic sequela.  + snoring.   The patient is tolerating medications without difficulties and is otherwise without complaint today.   Past Medical History  Diagnosis Date  . Hypertension   . Hyperlipidemia   . Elevated hemoglobin A1c   . Vitamin D deficiency   . IBS (irritable bowel syndrome)   . Allergy   . Anxiety   . Obesity   . Insomnia   . Palpitations   . Achilles tendonitis     R heal, limits activity   Past Surgical History  Procedure Laterality Date  . Cesarean section    . Cholecystectomy  10 years ago  . Tubal ligation Bilateral   . Cesarean section      Current Outpatient Prescriptions  Medication Sig Dispense Refill  . aspirin 81 MG tablet Take 81 mg by mouth daily.      Marland Kitchen atenolol (TENORMIN) 50 MG tablet Take 1 tablet (50 mg total) by mouth daily.  30 tablet  11  .  celecoxib (CELEBREX) 200 MG capsule Take 200 mg by mouth daily.      . Cholecalciferol (VITAMIN D-3 PO) Take 6,000 Units by mouth daily.       Marland Kitchen CINNAMON PO Take 1,000 mg by mouth 2 (two) times daily.      . DiphenhydrAMINE HCl (BENADRYL PO) Take 1 tablet by mouth at bedtime. For Sleep      . diphenoxylate-atropine (LOMOTIL) 2.5-0.025 MG per tablet Take 1 tablet by mouth 4 (four) times daily as needed for diarrhea or loose stools.  30 tablet  1  . Flaxseed, Linseed, (FLAX SEEDS PO) Take 1,000 mg by mouth 2 (two) times daily.      . hydrochlorothiazide (HYDRODIURIL) 25 MG tablet TAKE 1 TABLET EVERY DAY  90 tablet  4  . levocetirizine (XYZAL) 5 MG tablet TAKE 1 TABLET BY MOUTH EVERY DAY  90 tablet  4  . montelukast (SINGULAIR) 10 MG tablet TAKE 1 TABLET BY MOUTH EVERY DAY  90 tablet  4  . PREMPRO 0.625-2.5 MG per tablet TAKE AS DIRECTED  84 tablet  4  . quinapril (ACCUPRIL) 40 MG tablet Take 1 tablet (40 mg total) by mouth daily.  90 tablet  0  . vitamin B-12 (CYANOCOBALAMIN) 1000 MCG tablet Take 1,000 mcg by mouth daily.      . phentermine (ADIPEX-P) 37.5 MG tablet Take 1/2 to 1 tablet daily for dieting & weight  loss   (Pt has not started yet (10/29/13))       No current facility-administered medications for this visit.    Allergies  Allergen Reactions  . Ciprofloxacin   . Penicillins Other (See Comments)    Had as a child=unknown reaction  . Zoloft [Sertraline Hcl]     History   Social History  . Marital Status: Unknown    Spouse Name: N/A    Number of Children: N/A  . Years of Education: N/A   Occupational History  . Not on file.   Social History Main Topics  . Smoking status: Former Research scientist (life sciences)  . Smokeless tobacco: Never Used  . Alcohol Use: Yes     Comment: rarely, < 1 beer per week  . Drug Use: No  . Sexual Activity: Not on file   Other Topics Concern  . Not on file   Social History Narrative   Pt lives in [place] with spouse.  Retired from [employer].     Family History  Problem Relation Age of Onset  . Heart attack Mother   . Stroke Mother   . Heart disease Mother   . Diabetes Mother   . Macular degeneration Mother   . Heart attack Father   . Alcohol abuse Father   . Ovarian cancer Maternal Grandmother   . Colon cancer Neg Hx     ROS- All systems are reviewed and negative except as per the HPI above  Physical Exam: Filed Vitals:   10/29/13 0936  BP: 142/82  Pulse: 46  Height: 5\' 8"  (1.727 m)  Weight: 254 lb 3.2 oz (115.304 kg)    GEN- The patient is overweight appearing, alert and oriented x 3 today.   Head- normocephalic, atraumatic Eyes-  Sclera clear, conjunctiva pink Ears- hearing intact Oropharynx- clear Neck- supple, no JVP Lymph- no cervical lymphadenopathy Lungs- Clear to ausculation bilaterally, normal work of breathing Heart- Regular rate and rhythm, no murmurs, rubs or gallops, PMI not laterally displaced GI- soft, NT, ND, + BS Extremities- no clubbing, cyanosis, or edema MS- no significant deformity or atrophy Skin- no rash or lesion Psych- euthymic mood, full affect Neuro- strength and sensation are intact  EKG today reveals sinus bradycardia 46 bpm, otherwise normal ekg Event monitor 5/15 is reviewed as above Epic records including Dr Mariana Arn notes are reviewed Previous echo is reviewed  Assessment and Plan:  1. Palpitations I have reviewed her event monitor which reveals frequent pacs, rare and short nonsustained atrial tachycardia, short afib, and also a more prolonged regular SVT which is likely atrial flutter. Therapeutic strategies for her atrial arrhythmias including medicine and ablation were discussed in detail with the patient today.  At this time, she is clear that she would like to avoid additional medicine or ablations.  She feels that atenolol is reasonably controlling her symptoms.  She would like to focus on lifestyle modification. chads2vasc score is at least 2.  She declines  anticoagulation at this time.  2. Obesity Body mass index is 38.66 kg/(m^2). I spent a prolonged period today discussion lifestyle modification.  She states that she has lost up to 80 lbs previously and feels that she has the resources that she needs to lose weight.  3. Asymptomatic sinus bradycardia Noted Continue current atenolol dosing If she becomes symptomatic then we can adjust her medicine  4. Snoring She declines sleep study today and would like to lose weight first  Return to see Claudia [name] in the afib clinic in  3 months I will see as needed should she decide to pursue ablation. Follow-up with Dr [name] as scheduled

## 2013-11-02 ENCOUNTER — Other Ambulatory Visit: Payer: Self-pay | Admitting: Internal Medicine

## 2013-11-07 ENCOUNTER — Ambulatory Visit (INDEPENDENT_AMBULATORY_CARE_PROVIDER_SITE_OTHER): Admitting: Podiatry

## 2013-11-07 ENCOUNTER — Encounter: Payer: Self-pay | Admitting: Podiatry

## 2013-11-07 VITALS — BP 99/64 | HR 60 | Resp 12

## 2013-11-07 DIAGNOSIS — M7660 Achilles tendinitis, unspecified leg: Secondary | ICD-10-CM

## 2013-11-08 NOTE — Progress Notes (Signed)
Patient ID: Claudia [name], female   DOB: 05/13/1950, 63 y.o.   MRN: [id number]  Subjective: This patient presents after EPAT treatments x8. The posterior right heel pain is decreased from the start pain of 7/10 to approximately 5 or 6/10. She still has to reduce her activity wear a open back shoe. She does take Celebrex 200 mg daily for generalized arthritic pain  Objective:  posterior right heel is prominent with palpable tenderness in the posterior right heel and tendo Achilles area. Plantar flexion is 5 /5 right'  Assessment: Slightly improved posterior heel bursitis/Achilles tendinitis right  Plan: Patient will maintain bent knee stretching and open back shoes. I had a discussion about treatment options including surgical treatment. Patient does not have interest in surgical treatment at this time.  Reappoint at patient's request

## 2014-01-01 ENCOUNTER — Other Ambulatory Visit: Payer: Self-pay | Admitting: Physician Assistant

## 2014-01-01 MED ORDER — HYDROCHLOROTHIAZIDE 25 MG PO TABS: ORAL_TABLET | ORAL | Status: DC

## 2014-01-01 MED ORDER — CONJ ESTROG-MEDROXYPROGEST ACE 0.625-2.5 MG PO TABS: ORAL_TABLET | ORAL | Status: DC

## 2014-01-01 MED ORDER — MONTELUKAST SODIUM 10 MG PO TABS: ORAL_TABLET | ORAL | Status: DC

## 2014-01-01 MED ORDER — QUINAPRIL HCL 40 MG PO TABS: 40.0000 mg | ORAL_TABLET | Freq: Every day | ORAL | Status: DC

## 2014-01-01 MED ORDER — CELECOXIB 200 MG PO CAPS: ORAL_CAPSULE | ORAL | Status: DC

## 2014-01-03 ENCOUNTER — Other Ambulatory Visit: Payer: Self-pay | Admitting: *Deleted

## 2014-01-03 ENCOUNTER — Other Ambulatory Visit: Payer: Self-pay | Admitting: Internal Medicine

## 2014-01-03 MED ORDER — ATENOLOL 50 MG PO TABS
50.0000 mg | ORAL_TABLET | Freq: Every day | ORAL | Status: DC
Start: 2014-01-03 — End: 2014-04-10

## 2014-01-14 ENCOUNTER — Ambulatory Visit: Admitting: Internal Medicine

## 2014-01-16 ENCOUNTER — Ambulatory Visit: Payer: Self-pay | Admitting: Physician Assistant

## 2014-04-01 ENCOUNTER — Ambulatory Visit (INDEPENDENT_AMBULATORY_CARE_PROVIDER_SITE_OTHER): Admitting: Physician Assistant

## 2014-04-01 ENCOUNTER — Encounter: Payer: Self-pay | Admitting: Physician Assistant

## 2014-04-01 VITALS — BP 124/82 | HR 52 | Temp 98.6°F | Resp 16 | Ht 67.5 in | Wt 258.0 lb

## 2014-04-01 DIAGNOSIS — Z79899 Other long term (current) drug therapy: Secondary | ICD-10-CM

## 2014-04-01 DIAGNOSIS — E559 Vitamin D deficiency, unspecified: Secondary | ICD-10-CM

## 2014-04-01 DIAGNOSIS — E538 Deficiency of other specified B group vitamins: Secondary | ICD-10-CM

## 2014-04-01 DIAGNOSIS — R7309 Other abnormal glucose: Secondary | ICD-10-CM

## 2014-04-01 DIAGNOSIS — I10 Essential (primary) hypertension: Secondary | ICD-10-CM

## 2014-04-01 DIAGNOSIS — R1011 Right upper quadrant pain: Secondary | ICD-10-CM

## 2014-04-01 DIAGNOSIS — E669 Obesity, unspecified: Secondary | ICD-10-CM

## 2014-04-01 DIAGNOSIS — E785 Hyperlipidemia, unspecified: Secondary | ICD-10-CM

## 2014-04-01 LAB — CBC WITH DIFFERENTIAL/PLATELET
Basophils Absolute: 0.1 10*3/uL (ref 0.0–0.1)
Basophils Relative: 1 % (ref 0–1)
EOS ABS: 0.2 10*3/uL (ref 0.0–0.7)
EOS PCT: 3 % (ref 0–5)
HCT: 40.6 % (ref 36.0–46.0)
HEMOGLOBIN: 13.9 g/dL (ref 12.0–15.0)
LYMPHS ABS: 1.8 10*3/uL (ref 0.7–4.0)
Lymphocytes Relative: 29 % (ref 12–46)
MCH: 29.7 pg (ref 26.0–34.0)
MCHC: 34.2 g/dL (ref 30.0–36.0)
MCV: 86.8 fL (ref 78.0–100.0)
MONO ABS: 0.8 10*3/uL (ref 0.1–1.0)
MPV: 10.3 fL (ref 9.4–12.4)
Monocytes Relative: 12 % (ref 3–12)
NEUTROS PCT: 55 % (ref 43–77)
Neutro Abs: 3.5 10*3/uL (ref 1.7–7.7)
Platelets: 312 10*3/uL (ref 150–400)
RBC: 4.68 MIL/uL (ref 3.87–5.11)
RDW: 12.8 % (ref 11.5–15.5)
WBC: 6.3 10*3/uL (ref 4.0–10.5)

## 2014-04-01 LAB — HEMOGLOBIN A1C
Hgb A1c MFr Bld: 5.6 % (ref <5.7)
Mean Plasma Glucose: 114 mg/dL (ref <117)

## 2014-04-01 NOTE — Progress Notes (Addendum)
Subjective:    Patient ID: Claudia [name], female    DOB: 05-May-1950, 63 y.o.   MRN: [id number]  Abdominal Pain This is a new problem. Episode onset: 2-3 months ago. The onset quality is gradual. The problem occurs intermittently. The problem has been waxing and waning. The pain is located in the RUQ. The pain is at a severity of 1/10. The quality of the pain is aching. Pain radiation: Pain does radiate to right lower ribs. Pertinent negatives include no constipation, nausea or vomiting. Associated symptoms comments: She states that once in a while she will bend over at the waist and feels something get caught on right upper side.  She states she feel abdominal pain mostly with sitting and not standing.. Exacerbated by: After eating large meals. Relieved by: Sometimes relieved by bowel movements. She has tried nothing for the symptoms. The treatment provided no relief. Patient had cholecystectomy about 13 years ago in [place], but does not remember name of surgeon.  GFR= 79 on 10/15/13  Patient also presents for follow up for hypertension, hyperlipidemia, elevated A1C in past and vitamin D deficiency.  Last visit was 10/15/13 for physical. Her blood pressure has been controlled at home, today their BP is 124/82.  She is currently taking Atenolol 50mg , HCTZ 25mg  and Quinapril 40mg  daily.  She does not workout. She denies chest pain, shortness of breath, dizziness.   She is on cholesterol medication (Flaxseed) and denies myalgias. Her cholesterol is not at goal. The cholesterol last visit was:  Lab Results  Component Value Date   CHOL 155 10/15/2013   HDL 59 10/15/2013   LDLCALC 64 10/15/2013   TRIG 159* 10/15/2013   CHOLHDL 2.6 10/15/2013   She has been working on diet and not exercise for elevated A1C, and denies increased appetite, polydipsia and polyuria. Last A1C in the office was: Lab Results  Component Value Date   HGBA1C 5.5 10/15/2013   Patient is on Vitamin D supplement.-  6,000  IU Daily. Lab Results  Component Value Date   VD25OH 98* 10/15/2013   Review of Systems  Constitutional: Negative.   HENT: Negative.   Eyes: Negative.   Respiratory: Negative.   Cardiovascular: Negative.   Gastrointestinal: Positive for abdominal pain. Negative for nausea, vomiting, constipation and blood in stool.       States she does get loose stool and sometimes diarrhea.  Genitourinary: Negative.   Musculoskeletal: Negative.   Skin: Negative.  Negative for pallor.  Neurological: Negative.   Psychiatric/Behavioral: The patient is nervous/anxious.        Denies stress and depression   Past Medical History  Diagnosis Date  . Hypertension   . Hyperlipidemia   . Elevated hemoglobin A1c   . Vitamin D deficiency   . IBS (irritable bowel syndrome)   . Allergy   . Anxiety   . Obesity   . Insomnia   . Palpitations   . Achilles tendonitis     R heal, limits activity   Current Outpatient Prescriptions on File Prior to Visit  Medication Sig Dispense Refill  . aspirin 81 MG tablet Take 81 mg by mouth daily.    Marland Kitchen atenolol (TENORMIN) 50 MG tablet Take 1 tablet (50 mg total) by mouth daily. 90 tablet 1  . celecoxib (CELEBREX) 200 MG capsule TAKE ONE CAPSULE BY MOUTH EVERY DAY AFTER MEALS FOR PAIN AND INFLAMMATION 90 capsule 3  . Cholecalciferol (VITAMIN D-3 PO) Take 6,000 Units by mouth daily.     Marland Kitchen CINNAMON  PO Take 1,000 mg by mouth 2 (two) times daily.    . DiphenhydrAMINE HCl (BENADRYL PO) Take 1 tablet by mouth at bedtime. For Sleep    . diphenoxylate-atropine (LOMOTIL) 2.5-0.025 MG per tablet Take 1 tablet by mouth 4 (four) times daily as needed for diarrhea or loose stools. 30 tablet 1  . estrogen, conjugated,-medroxyprogesterone (PREMPRO) 0.625-2.5 MG per tablet TAKE AS DIRECTED 90 tablet 4  . Flaxseed, Linseed, (FLAX SEEDS PO) Take 1,000 mg by mouth 2 (two) times daily.    . hydrochlorothiazide (HYDRODIURIL) 25 MG tablet TAKE 1 TABLET EVERY DAY 90 tablet 4  . levocetirizine  (XYZAL) 5 MG tablet TAKE 1 TABLET BY MOUTH EVERY DAY 90 tablet 4  . montelukast (SINGULAIR) 10 MG tablet TAKE 1 TABLET BY MOUTH EVERY DAY 90 tablet 4  . quinapril (ACCUPRIL) 40 MG tablet TAKE 1 TABLET BY MOUTH EVERY DAY 90 tablet 0  . vitamin B-12 (CYANOCOBALAMIN) 1000 MCG tablet Take 1,000 mcg by mouth daily.     No current facility-administered medications on file prior to visit.   Allergies  Allergen Reactions  . Ciprofloxacin   . Penicillins Other (See Comments)    Had as a child=unknown reaction  . Zoloft [Sertraline Hcl]    Past Surgical History  Procedure Laterality Date  . Cesarean section    . Cholecystectomy  10 years ago  . Tubal ligation Bilateral   . Cesarean section     BP 124/82 mmHg  Pulse 52  Temp(Src) 98.6 F (37 C) (Temporal)  Resp 16  Ht 5' 7.5" (1.715 m)  Wt 258 lb (117.028 kg)  BMI 39.79 kg/m2  SpO2 98% Wt Readings from Last 3 Encounters:  04/01/14 258 lb (117.028 kg)  10/29/13 254 lb 3.2 oz (115.304 kg)  10/15/13 257 lb (116.574 kg)   Objective:   Physical Exam  Constitutional: She is oriented to person, place, and time. She appears well-developed and well-nourished. She does not have a sickly appearance. No distress.  HENT:  Head: Normocephalic.  Right Ear: Tympanic membrane, external ear and ear canal normal.  Left Ear: Tympanic membrane, external ear and ear canal normal.  Nose: Nose normal. Right sinus exhibits no maxillary sinus tenderness and no frontal sinus tenderness. Left sinus exhibits no maxillary sinus tenderness and no frontal sinus tenderness.  Mouth/Throat: Uvula is midline, oropharynx is clear and moist and mucous membranes are normal. Mucous membranes are not pale and not dry. No trismus in the jaw. No uvula swelling. No oropharyngeal exudate, posterior oropharyngeal edema, posterior oropharyngeal erythema or tonsillar abscesses.  Eyes: Conjunctivae, EOM and lids are normal. Pupils are equal, round, and reactive to light. Right eye  exhibits no discharge. Left eye exhibits no discharge. No scleral icterus.  Neck: Trachea normal, normal range of motion and phonation normal. Neck supple. No tracheal tenderness present. No tracheal deviation present. No thyroid mass and no thyromegaly present.  Cardiovascular: Normal rate, regular rhythm, S1 normal, S2 normal, normal heart sounds and normal pulses.  Exam reveals no gallop, no distant heart sounds and no friction rub.   No murmur heard. Pulmonary/Chest: Effort normal and breath sounds normal. No stridor. No respiratory distress. She has no decreased breath sounds. She has no wheezes. She has no rhonchi. She has no rales. She exhibits no tenderness.  Abdominal: Soft. Bowel sounds are normal. She exhibits no distension, no abdominal bruit, no pulsatile midline mass and no mass. There is no hepatosplenomegaly. There is tenderness in the right upper quadrant. There is  no rebound, no guarding and negative Murphy's sign. No hernia.  Lymphadenopathy:  No tenderness or LAD.  Neurological: She is alert and oriented to person, place, and time. She has normal strength. No cranial nerve deficit or sensory deficit. Gait normal.  Skin: Skin is warm, dry and intact. No rash noted. She is not diaphoretic. No pallor.  Psychiatric: She has a normal mood and affect. Her speech is normal and behavior is normal. Judgment and thought content normal. Cognition and memory are normal.  Vitals reviewed.  Assessment & Plan:  1. Essential hypertension Continue Atenolol, HCTZ and Quinapril as prescribed.  Monitor blood pressure at home.  Reminder to go to the ER if any CP, SOB, nausea, dizziness, severe HA, changes vision/speech, left arm numbness and tingling and jaw pain. - CBC with Differential - BASIC METABOLIC PANEL WITH GFR - Hepatic function panel - Magnesium  2. Hyperlipidemia Continue Flaxseed as prescribed.  Please follow recommended diet and exercise.  Check cholesterol. - Lipid panel  3.  Elevated hemoglobin A1c Please follow recommended diet and exercise.  Check A1C and Insulin levels. - Hemoglobin A1c - Insulin, fasting  4. Vitamin D deficiency Continue Vitamin D 6,000 IU Daily.  Check vitamin D level. - Vit D  25 hydroxy (rtn osteoporosis monitoring)  5. Obesity Please follow recommended diet and exercise.  6. Encounter for long-term (current) use of medications Will monitor kidney and liver function. - CBC with Differential - BASIC METABOLIC PANEL WITH GFR - Hepatic function panel - Magnesium  7. Vitamin B12 deficiency Continue Vitamin B12 1,000 mcg daily.  Check vitamin B12 level. - Vitamin B12  8. RUQ pain- fatty liver? Cirrhosis? Ordered Ultrasound to R/O any causes of abdominal pain. - US Abdomen Complete; Future  Continue diet and meds as discussed. Further disposition pending results of labs.  Discussed medication effects and SE's.  Pt agreed to treatment plan. Please follow up in 3 months.  Chelcea Zahn, Stephani Police, PA-C 9:22 AM [facility]

## 2014-04-01 NOTE — Patient Instructions (Signed)
-Continue medications as prescribed. -I will call you with results.   Please follow up in 3 months.  Fatty Liver Fatty liver is the accumulation of fat in liver cells. It is also called hepatosteatosis or steatohepatitis. It is normal for your liver to contain some fat. If fat is more than 5 to 10% of your liver's weight, you have fatty liver.  There are often no symptoms (problems) for years while damage is still occurring. People often learn about their fatty liver when they have medical tests for other reasons. Fat can damage your liver for years or even decades without causing problems. When it becomes severe, it can cause fatigue, weight loss, weakness, and confusion. This makes you more likely to develop more serious liver problems. The liver is the largest organ in the body. It does a lot of work and often gives no warning signs when it is sick until late in a disease. The liver has many important jobs including:  Breaking down foods.  Storing vitamins, iron, and other minerals.  Making proteins.  Making bile for food digestion.  Breaking down many products including medications, alcohol and some poisons. CAUSES  There are a number of different conditions, medications, and poisons that can cause a fatty liver. Eating too many calories causes fat to build up in the liver. Not processing and breaking fats down normally may also cause this. Certain conditions, such as obesity, diabetes, and high triglycerides also cause this. Most fatty liver patients tend to be middle-aged and over weight.  Some causes of fatty liver are:  Alcohol over consumption.  Malnutrition.  Steroid use.  Valproic acid toxicity.  Obesity.  Cushing's syndrome.  Poisons.  Tetracycline in high dosages.  Pregnancy.  Diabetes.  Hyperlipidemia.  Rapid weight loss. Some people develop fatty liver even having none of these conditions. SYMPTOMS  Fatty liver most often causes no problems. This is  called asymptomatic.  It can be diagnosed with blood tests and also by a liver biopsy.  It is one of the most common causes of minor elevations of liver enzymes on routine blood tests.  Specialized Imaging of the liver using ultrasound, CT (computed tomography) scan, or MRI (magnetic resonance imaging) can suggest a fatty liver but a biopsy is needed to confirm it.  A biopsy involves taking a small sample of liver tissue. This is done by using a needle. It is then looked at under a microscope by a specialist. TREATMENT  It is important to treat the cause. Simple fatty liver without a medical reason may not need treatment.  Weight loss, fat restriction, and exercise in overweight patients produces inconsistent results but is worth trying.  Fatty liver due to alcohol toxicity may not improve even with stopping drinking.  Good control of diabetes may reduce fatty liver.  Lower your triglycerides through diet, medication or both.  Eat a balanced, healthy diet.  Increase your physical activity.  Get regular checkups from a liver specialist.  There are no medical or surgical treatments for a fatty liver or NASH, but improving your diet and increasing your exercise may help prevent or reverse some of the damage. PROGNOSIS  Fatty liver may cause no damage or it can lead to an inflammation of the liver. This is, called steatohepatitis. When it is linked to alcohol abuse, it is called alcoholic steatohepatitis. It often is not linked to alcohol. It is then called nonalcoholic steatohepatitis, or NASH. Over time the liver may become scarred and hardened. This condition  is called cirrhosis. Cirrhosis is serious and may lead to liver failure or cancer. NASH is one of the leading causes of cirrhosis. About 10-20% of Americans have fatty liver and a smaller 2-5% has NASH. Document Released: 05/07/2005 Document Revised: 06/14/2011 Document Reviewed: 08/01/2013 [facility] Patient Information 2015  [place], [place]. This information is not intended to replace advice given to you by your health care provider. Make sure you discuss any questions you have with your health care provider.      Diet and Irritable Bowel Syndrome  No cure has been found for irritable bowel syndrome (IBS). Many options are available to treat the symptoms. Your caregiver will give you the best treatments available for your symptoms. He or she will also encourage you to manage stress and to make changes to your diet. You need to work with your caregiver and Registered Dietician to find the best combination of medicine, diet, counseling, and support to control your symptoms. The following are some diet suggestions. FOODS THAT MAKE IBS WORSE  Fatty foods, such as [place] fries.  Milk products, such as cheese or ice cream.  Chocolate.  Alcohol.  Caffeine (found in coffee and some sodas).  Carbonated drinks, such as soda. If certain foods cause symptoms, you should eat less of them or stop eating them. FOOD JOURNAL   Keep a journal of the foods that seem to cause distress. Write down:  What you are eating during the day and when.  What problems you are having after eating.  When the symptoms occur in relation to your meals.  What foods always make you feel badly.  Take your notes with you to your caregiver to see if you should stop eating certain foods. FOODS THAT MAKE IBS BETTER Fiber reduces IBS symptoms, especially constipation, because it makes stools soft, bulky, and easier to pass. Fiber is found in bran, bread, cereal, beans, fruit, and vegetables. Examples of foods with fiber include:  Apples.  Peaches.  Pears.  Berries.  Figs.  Broccoli, raw.  Cabbage.  Carrots.  Raw peas.  Kidney beans.  Lima beans.  Whole-grain bread.  Whole-grain cereal. Add foods with fiber to your diet a little at a time. This will let your body get used to them. Too much fiber at once might cause gas  and swelling of your abdomen. This can trigger symptoms in a person with IBS. Caregivers usually recommend a diet with enough fiber to produce soft, painless bowel movements. High fiber diets may cause gas and bloating. However, these symptoms often go away within a few weeks, as your body adjusts. In many cases, dietary fiber may lessen IBS symptoms, particularly constipation. However, it may not help pain or diarrhea. High fiber diets keep the colon mildly enlarged (distended) with the added fiber. This may help prevent spasms in the colon. Some forms of fiber also keep water in the stool, thereby preventing hard stools that are difficult to pass.  Besides telling you to eat more foods with fiber, your caregiver may also tell you to get more fiber by taking a fiber pill or drinking water mixed with a special high fiber powder. An example of this is a natural fiber laxative containing psyllium seed.  TIPS  Large meals can cause cramping and diarrhea in people with IBS. If this happens to you, try eating 4 or 5 small meals a day, or try eating less at each of your usual 3 meals. It may also help if your meals are low in  fat and high in carbohydrates. Examples of carbohydrates are pasta, rice, whole-grain breads and cereals, fruits, and vegetables.  If dairy products cause your symptoms to flare up, you can try eating less of those foods. You might be able to handle yogurt better than other dairy products, because it contains bacteria that helps with digestion. Dairy products are an important source of calcium and other nutrients. If you need to avoid dairy products, be sure to talk with a Registered Dietitian about getting these nutrients through other food sources.  Drink enough water and fluids to keep your urine clear or pale yellow. This is important, especially if you have diarrhea. FOR MORE INFORMATION  International Foundation for Functional Gastrointestinal Disorders: www.iffgd.org  National  Digestive Diseases Information Clearinghouse: digestive.AmenCredit.is Document Released: 06/12/2003 Document Revised: 06/14/2011 Document Reviewed: 06/22/2013 [facility] Patient Information 2015 [place], [place]. This information is not intended to replace advice given to you by your health care provider. Make sure you discuss any questions you have with your health care provider.

## 2014-04-02 LAB — BASIC METABOLIC PANEL WITH GFR
BUN: 17 mg/dL (ref 6–23)
CALCIUM: 9.5 mg/dL (ref 8.4–10.5)
CO2: 26 meq/L (ref 19–32)
Chloride: 100 mEq/L (ref 96–112)
Creat: 0.95 mg/dL (ref 0.50–1.10)
GFR, EST NON AFRICAN AMERICAN: 65 mL/min
GFR, Est African American: 75 mL/min
Glucose, Bld: 96 mg/dL (ref 70–99)
Potassium: 4.2 mEq/L (ref 3.5–5.3)
SODIUM: 136 meq/L (ref 135–145)

## 2014-04-02 LAB — HEPATIC FUNCTION PANEL
ALT: 15 U/L (ref 0–35)
AST: 16 U/L (ref 0–37)
Albumin: 3.9 g/dL (ref 3.5–5.2)
Alkaline Phosphatase: 57 U/L (ref 39–117)
Bilirubin, Direct: 0.1 mg/dL (ref 0.0–0.3)
Indirect Bilirubin: 0.6 mg/dL (ref 0.2–1.2)
Total Bilirubin: 0.7 mg/dL (ref 0.2–1.2)
Total Protein: 7.3 g/dL (ref 6.0–8.3)

## 2014-04-02 LAB — LIPID PANEL
Cholesterol: 160 mg/dL (ref 0–200)
HDL: 49 mg/dL (ref >39)
LDL Cholesterol: 76 mg/dL (ref 0–99)
Total CHOL/HDL Ratio: 3.3 Ratio
Triglycerides: 176 mg/dL — ABNORMAL HIGH (ref <150)
VLDL: 35 mg/dL (ref 0–40)

## 2014-04-02 LAB — INSULIN, FASTING: INSULIN FASTING, SERUM: 11.5 u[IU]/mL (ref 2.0–19.6)

## 2014-04-02 LAB — VITAMIN D 25 HYDROXY (VIT D DEFICIENCY, FRACTURES): Vit D, 25-Hydroxy: 63 ng/mL (ref 30–100)

## 2014-04-02 LAB — VITAMIN B12: VITAMIN B 12: 995 pg/mL — AB (ref 211–911)

## 2014-04-02 LAB — MAGNESIUM: Magnesium: 1.9 mg/dL (ref 1.5–2.5)

## 2014-04-04 ENCOUNTER — Other Ambulatory Visit: Payer: Self-pay | Admitting: Physician Assistant

## 2014-04-04 ENCOUNTER — Ambulatory Visit
Admission: RE | Admit: 2014-04-04 | Discharge: 2014-04-04 | Disposition: A | Discharge status: Home / Self Care | Source: Ambulatory Visit | Attending: Physician Assistant | Admitting: Physician Assistant

## 2014-04-04 DIAGNOSIS — R1011 Right upper quadrant pain: Secondary | ICD-10-CM

## 2014-04-08 ENCOUNTER — Other Ambulatory Visit: Payer: Self-pay

## 2014-04-08 MED ORDER — QUINAPRIL HCL 40 MG PO TABS: 40.0000 mg | ORAL_TABLET | Freq: Every day | ORAL | Status: DC

## 2014-04-08 NOTE — Telephone Encounter (Signed)
Per paper note from front office staff, patient called for a 2 week supply of Quinapril to be sent to [facility] until her 36 day from mail order comes, I did not see where mail order was ever done, I sent # [phone] to [facility] and [phone]

## 2014-04-10 ENCOUNTER — Ambulatory Visit: Payer: Self-pay | Admitting: Internal Medicine

## 2014-04-10 ENCOUNTER — Encounter: Payer: Self-pay | Admitting: Nurse Practitioner

## 2014-04-10 ENCOUNTER — Ambulatory Visit (INDEPENDENT_AMBULATORY_CARE_PROVIDER_SITE_OTHER): Admitting: Nurse Practitioner

## 2014-04-10 VITALS — BP 122/80 | HR 45 | Ht 68.0 in | Wt 256.4 lb

## 2014-04-10 DIAGNOSIS — I480 Paroxysmal atrial fibrillation: Secondary | ICD-10-CM

## 2014-04-10 MED ORDER — ATENOLOL 50 MG PO TABS: 25.0000 mg | ORAL_TABLET | Freq: Every day | ORAL | Status: DC

## 2014-04-10 NOTE — Patient Instructions (Signed)
Your physician has recommended you make the following change in your medication:   Decrease Atenolol 25mg  by mouth daily (1/2 a pill of Atenolol 50 mg)  Your physician recommends that you schedule a follow-up appointment as needed.

## 2014-04-10 NOTE — Progress Notes (Signed)
Primary Care Physician: Alesia Richards, MD Referring Physician:  Dr Claudia [name] is a 64 y.o. female with a h/o htn, obesity, and palpitations who presents today for EP evaluation.  She reports having intermittent palpitations for "years".  She wore an event monitor this last spring.  This documented frequent pacs as well as rare nonsustained atrial tachycardia.  She also has had short episodes of rate controlled afib.  There was one  regular and prolonged SVT which is likely atrial flutter.  She has had good response to atenolol to manage palpitations. She only will notice palpitations now, sometime at bedtime, and it feels like a thump, more consistent with a PC.  On last visit, she declined further treatment with procedures/AAD/blood thinners. She has a chadsvasc score of at least two but she still declines blood thinners.. Has to take Celebrex daily for arthritis pain and she feels her quality of life will suffer if she has to stop this drug.  She is committed to losing weight and she has started dietary measures. She lost 80 lbs before and knows how to achieve this goal. States hypertension and snoring went away with weight loss before.  She states she has some intermittent dizziness and heart rate is 45 bpm. Discussed decreasing BB to 1/2 tab a day.  Today, she denies symptoms of chest pain, shortness of breath, orthopnea, PND, lower extremity edema, dizziness, presyncope, syncope, or neurologic sequela.  + snoring.   The patient is otherwise without complaint today.   Past Medical History  Diagnosis Date  . Hypertension   . Hyperlipidemia   . Elevated hemoglobin A1c   . Vitamin D deficiency   . IBS (irritable bowel syndrome)   . Allergy   . Anxiety   . Obesity   . Insomnia   . Palpitations   . Achilles tendonitis     R heal, limits activity   Past Surgical History  Procedure Laterality Date  . Cesarean section    . Cholecystectomy  10 years ago  .  Tubal ligation Bilateral   . Cesarean section      Current Outpatient Prescriptions  Medication Sig Dispense Refill  . aspirin 81 MG tablet Take 81 mg by mouth daily.    Marland Kitchen atenolol (TENORMIN) 50 MG tablet Take 0.5 tablets (25 mg total) by mouth daily. 90 tablet 1  . celecoxib (CELEBREX) 200 MG capsule TAKE ONE CAPSULE BY MOUTH EVERY DAY AFTER MEALS FOR PAIN AND INFLAMMATION 90 capsule 3  . Cholecalciferol (VITAMIN D-3 PO) Take 6,000 Units by mouth daily.     Marland Kitchen CINNAMON PO Take 1,000 mg by mouth 2 (two) times daily.    . DiphenhydrAMINE HCl (BENADRYL PO) Take 1 tablet by mouth at bedtime. For Sleep    . diphenoxylate-atropine (LOMOTIL) 2.5-0.025 MG per tablet Take 1 tablet by mouth 4 (four) times daily as needed for diarrhea or loose stools. 30 tablet 1  . estrogen, conjugated,-medroxyprogesterone (PREMPRO) 0.625-2.5 MG per tablet TAKE AS DIRECTED 90 tablet 4  . Flaxseed, Linseed, (FLAX SEEDS PO) Take 1,000 mg by mouth 2 (two) times daily.    . hydrochlorothiazide (HYDRODIURIL) 25 MG tablet TAKE 1 TABLET EVERY DAY 90 tablet 4  . levocetirizine (XYZAL) 5 MG tablet TAKE 1 TABLET BY MOUTH EVERY DAY 90 tablet 4  . montelukast (SINGULAIR) 10 MG tablet TAKE 1 TABLET BY MOUTH EVERY DAY 90 tablet 4  . quinapril (ACCUPRIL) 40 MG tablet Take 1 tablet (40 mg total)  by mouth daily. 30 tablet 0  . vitamin B-12 (CYANOCOBALAMIN) 1000 MCG tablet Take 1,000 mcg by mouth daily.     No current facility-administered medications for this visit.    Allergies  Allergen Reactions  . Ciprofloxacin   . Penicillins Other (See Comments)    Had as a child=unknown reaction  . Zoloft [Sertraline Hcl]     History   Social History  . Marital Status: Unknown    Spouse Name: N/A    Number of Children: N/A  . Years of Education: N/A   Occupational History  . Not on file.   Social History Main Topics  . Smoking status: Former Smoker    Quit date: 04/01/2004  . Smokeless tobacco: Never Used     Comment:  Social smoker times 10 years  . Alcohol Use: 0.0 oz/week    0 Not specified per week     Comment: rarely, < 1 beer per week  . Drug Use: No  . Sexual Activity: Not on file   Other Topics Concern  . Not on file   Social History Narrative   Pt lives in [place] with spouse.  Retired from [employer].    Family History  Problem Relation Age of Onset  . Heart attack Mother   . Stroke Mother   . Heart disease Mother   . Diabetes Mother   . Macular degeneration Mother   . Heart attack Father   . Alcohol abuse Father   . Ovarian cancer Maternal Grandmother   . Colon cancer Neg Hx     ROS-  systems are reviewed and negative except as per the HPI   Physical Exam: Filed Vitals:   04/10/14 1124  BP: 122/80  Pulse: 45  Height: 5\' 8"  (1.727 m)  Weight: 256 lb 6.4 oz (116.302 kg)  SpO2: 97%    GEN- The patient is overweight appearing, alert and oriented x 3 today.   Head- normocephalic, atraumatic Eyes-  Sclera clear, conjunctiva pink Ears- hearing intact Oropharynx- clear Neck- supple, no JVP Lymph- no cervical lymphadenopathy Lungs- Clear to ausculation bilaterally, normal work of breathing Heart- Slow,regular rate and rhythm, no murmurs, rubs or gallops, PMI not laterally displaced GI- soft, NT, ND, + BS Extremities- no clubbing, cyanosis, or edema MS- no significant deformity or atrophy Skin- no rash or lesion Psych- euthymic mood, full affect Neuro- strength and sensation are intact  EKG today reveals sinus bradycardia 45 bpm, otherwise normal ekg   Assessment and Plan:  1. Palpitations   Previous Event monitor   Revealed  frequent pacs, rare and short nonsustained atrial tachycardia, short afib, and also a more prolonged regular SVT which is likely atrial flutter. Therapeutic strategies for her atrial arrhythmias including medicine and ablation were  Previously discussed in detail with the patient by Dr. Rayann Heman, which were reviewed today.   At this  time, she is clear that she would like to avoid additional medicine or ablations.  She feels that atenolol is reasonably controlling her symptoms, which she only notices a few "thumps" when trying to go to sleep.Marland Kitchen  She would like to focus on lifestyle modification. chads2vasc score is at least 2.  She declines anticoagulation at this time.  2. Obesity Body mass index is 38.99 kg/(m^2). I spent a prolonged period today discussion lifestyle modification.  She states that she has lost up to 80 lbs previously and feels that she has the resources that she needs to lose weight.  3. Symptomatic sinus  bradycardia Has noticed some dizziness. Decrease atenolol by 1/2 tab and follow for worsening palps. If so, contact the office. Monitor offered but she declined.  4. Snoring She declines sleep study today and would like to lose weight first  Will see as needed going forward at her request. Sees her pcp every 3 months.

## 2014-04-12 ENCOUNTER — Other Ambulatory Visit

## 2014-04-12 ENCOUNTER — Other Ambulatory Visit: Payer: Self-pay | Admitting: Physician Assistant

## 2014-04-12 MED ORDER — MONTELUKAST SODIUM 10 MG PO TABS: ORAL_TABLET | ORAL | Status: DC

## 2014-04-12 MED ORDER — HYDROCHLOROTHIAZIDE 25 MG PO TABS: ORAL_TABLET | ORAL | Status: DC

## 2014-04-12 MED ORDER — CONJ ESTROG-MEDROXYPROGEST ACE 0.625-2.5 MG PO TABS: ORAL_TABLET | ORAL | Status: DC

## 2014-04-16 ENCOUNTER — Inpatient Hospital Stay: Admission: RE | Admit: 2014-04-16 | Source: Ambulatory Visit

## 2014-04-16 ENCOUNTER — Other Ambulatory Visit: Payer: Self-pay

## 2014-04-16 ENCOUNTER — Ambulatory Visit
Admission: RE | Admit: 2014-04-16 | Discharge: 2014-04-16 | Disposition: A | Discharge status: Home / Self Care | Source: Ambulatory Visit | Attending: Internal Medicine | Admitting: Internal Medicine

## 2014-04-16 DIAGNOSIS — R1011 Right upper quadrant pain: Secondary | ICD-10-CM

## 2014-04-16 MED ORDER — IOHEXOL 350 MG/ML SOLN
125.0000 mL | Freq: Once | INTRAVENOUS | Status: AC | PRN
  Administered 2014-04-16: 125 mL via INTRAVENOUS

## 2014-04-24 ENCOUNTER — Ambulatory Visit: Payer: Self-pay | Admitting: Internal Medicine

## 2014-06-17 ENCOUNTER — Other Ambulatory Visit: Payer: Self-pay | Admitting: Cardiovascular Disease

## 2014-07-01 ENCOUNTER — Ambulatory Visit: Payer: Self-pay | Admitting: Physician Assistant

## 2014-07-19 ENCOUNTER — Ambulatory Visit: Payer: Self-pay | Admitting: Physician Assistant

## 2014-10-17 ENCOUNTER — Encounter: Payer: Self-pay | Admitting: Internal Medicine

## 2014-10-17 ENCOUNTER — Ambulatory Visit (INDEPENDENT_AMBULATORY_CARE_PROVIDER_SITE_OTHER): Admitting: Internal Medicine

## 2014-10-17 VITALS — BP 128/80 | HR 56 | Temp 97.0°F | Resp 16 | Ht 67.0 in | Wt 259.0 lb

## 2014-10-17 DIAGNOSIS — E785 Hyperlipidemia, unspecified: Secondary | ICD-10-CM

## 2014-10-17 DIAGNOSIS — Z0000 Encounter for general adult medical examination without abnormal findings: Secondary | ICD-10-CM

## 2014-10-17 DIAGNOSIS — Z6838 Body mass index (BMI) 38.0-38.9, adult: Secondary | ICD-10-CM

## 2014-10-17 DIAGNOSIS — I480 Paroxysmal atrial fibrillation: Secondary | ICD-10-CM

## 2014-10-17 DIAGNOSIS — I10 Essential (primary) hypertension: Secondary | ICD-10-CM

## 2014-10-17 DIAGNOSIS — E559 Vitamin D deficiency, unspecified: Secondary | ICD-10-CM

## 2014-10-17 DIAGNOSIS — Z79899 Other long term (current) drug therapy: Secondary | ICD-10-CM

## 2014-10-17 DIAGNOSIS — Z1212 Encounter for screening for malignant neoplasm of rectum: Secondary | ICD-10-CM

## 2014-10-17 DIAGNOSIS — R7309 Other abnormal glucose: Secondary | ICD-10-CM

## 2014-10-17 DIAGNOSIS — R5383 Other fatigue: Secondary | ICD-10-CM

## 2014-10-17 LAB — CBC WITH DIFFERENTIAL/PLATELET
BASOS PCT: 0 % (ref 0–1)
Basophils Absolute: 0 10*3/uL (ref 0.0–0.1)
EOS PCT: 3 % (ref 0–5)
Eosinophils Absolute: 0.2 10*3/uL (ref 0.0–0.7)
HEMATOCRIT: 39.2 % (ref 36.0–46.0)
HEMOGLOBIN: 12.9 g/dL (ref 12.0–15.0)
Lymphocytes Relative: 27 % (ref 12–46)
Lymphs Abs: 2.1 10*3/uL (ref 0.7–4.0)
MCH: 28.7 pg (ref 26.0–34.0)
MCHC: 32.9 g/dL (ref 30.0–36.0)
MCV: 87.3 fL (ref 78.0–100.0)
MONOS PCT: 8 % (ref 3–12)
MPV: 10 fL (ref 8.6–12.4)
Monocytes Absolute: 0.6 10*3/uL (ref 0.1–1.0)
Neutro Abs: 4.9 10*3/uL (ref 1.7–7.7)
Neutrophils Relative %: 62 % (ref 43–77)
PLATELETS: 296 10*3/uL (ref 150–400)
RBC: 4.49 MIL/uL (ref 3.87–5.11)
RDW: 13.3 % (ref 11.5–15.5)
WBC: 7.9 10*3/uL (ref 4.0–10.5)

## 2014-10-17 LAB — BASIC METABOLIC PANEL WITH GFR
BUN: 19 mg/dL (ref 6–23)
CO2: 20 mEq/L (ref 19–32)
Calcium: 8.7 mg/dL (ref 8.4–10.5)
Chloride: 103 mEq/L (ref 96–112)
Creat: 0.8 mg/dL (ref 0.50–1.10)
GFR, Est African American: >89 mL/min
GFR, Est Non African American: 79 mL/min
Glucose, Bld: 94 mg/dL (ref 70–99)
POTASSIUM: 3.6 meq/L (ref 3.5–5.3)
SODIUM: 139 meq/L (ref 135–145)

## 2014-10-17 LAB — HEPATIC FUNCTION PANEL
ALBUMIN: 3.7 g/dL (ref 3.5–5.2)
ALK PHOS: 59 U/L (ref 39–117)
ALT: 17 U/L (ref 0–35)
AST: 20 U/L (ref 0–37)
Bilirubin, Direct: 0.1 mg/dL (ref 0.0–0.3)
Indirect Bilirubin: 0.5 mg/dL (ref 0.2–1.2)
Total Bilirubin: 0.6 mg/dL (ref 0.2–1.2)
Total Protein: 7.1 g/dL (ref 6.0–8.3)

## 2014-10-17 LAB — MAGNESIUM: Magnesium: 1.8 mg/dL (ref 1.5–2.5)

## 2014-10-17 LAB — VITAMIN B12: Vitamin B-12: 731 pg/mL (ref 211–911)

## 2014-10-17 LAB — IRON AND TIBC
%SAT: 24 % (ref 20–55)
Iron: 84 ug/dL (ref 42–145)
TIBC: 357 ug/dL (ref 250–470)
UIBC: 273 ug/dL (ref 125–400)

## 2014-10-17 LAB — LIPID PANEL
CHOLESTEROL: 152 mg/dL (ref 0–200)
HDL: 53 mg/dL (ref >=46)
LDL Cholesterol: 53 mg/dL (ref 0–99)
Total CHOL/HDL Ratio: 2.9 Ratio
Triglycerides: 231 mg/dL — ABNORMAL HIGH (ref <150)
VLDL: 46 mg/dL — ABNORMAL HIGH (ref 0–40)

## 2014-10-17 LAB — HEMOGLOBIN A1C
Hgb A1c MFr Bld: 5.8 % — ABNORMAL HIGH (ref <5.7)
Mean Plasma Glucose: 120 mg/dL — ABNORMAL HIGH (ref <117)

## 2014-10-17 LAB — TSH: TSH: 1.898 u[IU]/mL (ref 0.350–4.500)

## 2014-10-17 NOTE — Patient Instructions (Signed)
Recommend Adult Low dose Aspirin or coated  Aspirin 81 mg daily   To reduce risk of Colon Cancer 20 %,   Skin Cancer 26 % ,   Melanoma 46%   and   Pancreatic cancer 60% ++++++++++++++++++ Vitamin D goal is between 70-100.   Please make sure that you are taking your Vitamin D as directed.   It is very important as a natural anti-inflammatory   helping hair, skin, and nails, as well as reducing stroke and heart attack risk.   It helps your bones and helps with mood.  It also decreases numerous cancer risks so please take it as directed.   Low Vit D is associated with a 200-300% higher risk for CANCER   and 200-300% higher risk for HEART   ATTACK  &  STROKE.   ......................................  It is also associated with higher death rate at younger ages,   autoimmune diseases like Rheumatoid arthritis, Lupus, Multiple Sclerosis.     Also many other serious conditions, like depression, Alzheimer's  Dementia, infertility, muscle aches, fatigue, fibromyalgia - just to name a few.  +++++++++++++++++++  Recommend the book "The END of DIETING" by Dr Joel Fuhrman   & the book "The END of DIABETES " by Dr Joel Fuhrman  At [employer] - get book & Audio CD's     Being diabetic has a  300% increased risk for heart attack, stroke, cancer, and alzheimer- type vascular dementia. It is very important that you work harder with diet by avoiding all foods that are white. Avoid white rice (brown & wild rice is OK), white potatoes (sweetpotatoes in moderation is OK), White bread or wheat bread or anything made out of white flour like bagels, donuts, rolls, buns, biscuits, cakes, pastries, cookies, pizza crust, and pasta (made from white flour & egg whites) - vegetarian pasta or spinach or wheat pasta is OK. Multigrain breads like Arnold's or [employer], or multigrain sandwich thins or flatbreads.  Diet, exercise and weight loss can reverse and cure diabetes in the early stages.   Diet, exercise and weight loss is very important in the control and prevention of complications of diabetes which affects every system in your body, ie. Brain - dementia/stroke, eyes - glaucoma/blindness, heart - heart attack/heart failure, kidneys - dialysis, stomach - gastric paralysis, intestines - malabsorption, nerves - severe painful neuritis, circulation - gangrene & loss of a leg(s), and finally cancer and Alzheimers.    I recommend avoid fried & greasy foods,  sweets/candy, white rice (brown or wild rice or Quinoa is OK), white potatoes (sweet potatoes are OK) - anything made from white flour - bagels, doughnuts, rolls, buns, biscuits,white and wheat breads, pizza crust and traditional pasta made of white flour & egg white(vegetarian pasta or spinach or wheat pasta is OK).  Multi-grain bread is OK - like multi-grain flat bread or sandwich thins. Avoid alcohol in excess. Exercise is also important.    Eat all the vegetables you want - avoid meat, especially red meat and dairy - especially cheese.  Cheese is the most concentrated form of trans-fats which is the worst thing to clog up our arteries. Veggie cheese is OK which can be found in the fresh produce section at [employer] or [employer] or [employer]  ++++++++++++++++++++++++++   Preventive Care for Adults  A healthy lifestyle and preventive care can promote health and wellness. Preventive health guidelines for women include the following key practices.  A routine yearly physical is a good way   to check with your health care provider about your health and preventive screening. It is a chance to share any concerns and updates on your health and to receive a thorough exam.  Visit your dentist for a routine exam and preventive care every 6 months. Brush your teeth twice a day and floss once a day. Good oral hygiene prevents tooth decay and gum disease.  The frequency of eye exams is based on your age, health, family medical history, use  of contact lenses, and other factors. Follow your health care provider's recommendations for frequency of eye exams.  Eat a healthy diet. Foods like vegetables, fruits, whole grains, low-fat dairy products, and lean protein foods contain the nutrients you need without too many calories. Decrease your intake of foods high in solid fats, added sugars, and salt. Eat the right amount of calories for you.Get information about a proper diet from your health care provider, if necessary.  Regular physical exercise is one of the most important things you can do for your health. Most adults should get at least 150 minutes of moderate-intensity exercise (any activity that increases your heart rate and causes you to sweat) each week. In addition, most adults need muscle-strengthening exercises on 2 or more days a week.  Maintain a healthy weight. The body mass index (BMI) is a screening tool to identify possible weight problems. It provides an estimate of body fat based on height and weight. Your health care provider can find your BMI and can help you achieve or maintain a healthy weight.For adults 20 years and older:  A BMI below 18.5 is considered underweight.  A BMI of 18.5 to 24.9 is normal.  A BMI of 25 to 29.9 is considered overweight.  A BMI of 30 and above is considered obese.  Maintain normal blood lipids and cholesterol levels by exercising and minimizing your intake of saturated fat. Eat a balanced diet with plenty of fruit and vegetables. If your lipid or cholesterol levels are high, you are over 50, or you are at high risk for heart disease, you may need your cholesterol levels checked more frequently.Ongoing high lipid and cholesterol levels should be treated with medicines if diet and exercise are not working.  If you smoke, find out from your health care provider how to quit. If you do not use tobacco, do not start.  Lung cancer screening is recommended for adults aged 17-80 years who are  at high risk for developing lung cancer because of a history of smoking. A yearly low-dose CT scan of the lungs is recommended for people who have at least a 30-pack-year history of smoking and are a current smoker or have quit within the past 15 years. A pack year of smoking is smoking an average of 1 pack of cigarettes a day for 1 year (for example: 1 pack a day for 30 years or 2 packs a day for 15 years). Yearly screening should continue until the smoker has stopped smoking for at least 15 years. Yearly screening should be stopped for people who develop a health problem that would prevent them from having lung cancer treatment.  Avoid use of street drugs. Do not share needles with anyone. Ask for help if you need support or instructions about stopping the use of drugs.  High blood pressure causes heart disease and increases the risk of stroke.  Ongoing high blood pressure should be treated with medicines if weight loss and exercise do not work.  If you are 55-79  years old, ask your health care provider if you should take aspirin to prevent strokes.  Diabetes screening involves taking a blood sample to check your fasting blood sugar level. This should be done once every 3 years, after age 3, if you are within normal weight and without risk factors for diabetes. Testing should be considered at a younger age or be carried out more frequently if you are overweight and have at least 1 risk factor for diabetes.  Breast cancer screening is essential preventive care for women. You should practice "breast self-awareness." This means understanding the normal appearance and feel of your breasts and may include breast self-examination. Any changes detected, no matter how small, should be reported to a health care provider. Women in their 10s and 30s should have a clinical breast exam (CBE) by a health care provider as part of a regular health exam every 1 to 3 years. After age 67, women should have a CBE every  year. Starting at age 66, women should consider having a mammogram (breast X-ray test) every year. Women who have a family history of breast cancer should talk to their health care provider about genetic screening. Women at a high risk of breast cancer should talk to their health care providers about having an MRI and a mammogram every year.  Breast cancer gene (BRCA)-related cancer risk assessment is recommended for women who have family members with BRCA-related cancers. BRCA-related cancers include breast, ovarian, tubal, and peritoneal cancers. Having family members with these cancers may be associated with an increased risk for harmful changes (mutations) in the breast cancer genes BRCA1 and BRCA2. Results of the assessment will determine the need for genetic counseling and BRCA1 and BRCA2 testing.  Routine pelvic exams to screen for cancer are no longer recommended for nonpregnant women who are considered low risk for cancer of the pelvic organs (ovaries, uterus, and vagina) and who do not have symptoms. Ask your health care provider if a screening pelvic exam is right for you.  If you have had past treatment for cervical cancer or a condition that could lead to cancer, you need Pap tests and screening for cancer for at least 20 years after your treatment. If Pap tests have been discontinued, your risk factors (such as having a new sexual partner) need to be reassessed to determine if screening should be resumed. Some women have medical problems that increase the chance of getting cervical cancer. In these cases, your health care provider may recommend more frequent screening and Pap tests.    Colorectal cancer can be detected and often prevented. Most routine colorectal cancer screening begins at the age of 97 years and continues through age 68 years. However, your health care provider may recommend screening at an earlier age if you have risk factors for colon cancer. On a yearly basis, your health  care provider may provide home test kits to check for hidden blood in the stool. Use of a small camera at the end of a tube, to directly examine the colon (sigmoidoscopy or colonoscopy), can detect the earliest forms of colorectal cancer. Talk to your health care provider about this at age 65, when routine screening begins. Direct exam of the colon should be repeated every 5-10 years through age 81 years, unless early forms of pre-cancerous polyps or small growths are found.  Osteoporosis is a disease in which the bones lose minerals and strength with aging. This can result in serious bone fractures or breaks. The risk of osteoporosis  can be identified using a bone density scan. Women ages 13 years and over and women at risk for fractures or osteoporosis should discuss screening with their health care providers. Ask your health care provider whether you should take a calcium supplement or vitamin D to reduce the rate of osteoporosis.  Menopause can be associated with physical symptoms and risks. Hormone replacement therapy is available to decrease symptoms and risks. You should talk to your health care provider about whether hormone replacement therapy is right for you.  Use sunscreen. Apply sunscreen liberally and repeatedly throughout the day. You should seek shade when your shadow is shorter than you. Protect yourself by wearing long sleeves, pants, a wide-brimmed hat, and sunglasses year round, whenever you are outdoors.  Once a month, do a whole body skin exam, using a mirror to look at the skin on your back. Tell your health care provider of new moles, moles that have irregular borders, moles that are larger than a pencil eraser, or moles that have changed in shape or color.  Stay current with required vaccines (immunizations).  Influenza vaccine. All adults should be immunized every year.  Tetanus, diphtheria, and acellular pertussis (Td, Tdap) vaccine. Pregnant women should receive 1 dose of  Tdap vaccine during each pregnancy. The dose should be obtained regardless of the length of time since the last dose. Immunization is preferred during the 27th-36th week of gestation. An adult who has not previously received Tdap or who does not know her vaccine status should receive 1 dose of Tdap. This initial dose should be followed by tetanus and diphtheria toxoids (Td) booster doses every 10 years. Adults with an unknown or incomplete history of completing a 3-dose immunization series with Td-containing vaccines should begin or complete a primary immunization series including a Tdap dose. Adults should receive a Td booster every 10 years.    Zoster vaccine. One dose is recommended for adults aged 35 years or older unless certain conditions are present.    Pneumococcal 13-valent conjugate (PCV13) vaccine. When indicated, a person who is uncertain of her immunization history and has no record of immunization should receive the PCV13 vaccine. An adult aged 71 years or older who has certain medical conditions and has not been previously immunized should receive 1 dose of PCV13 vaccine. This PCV13 should be followed with a dose of pneumococcal polysaccharide (PPSV23) vaccine. The PPSV23 vaccine dose should be obtained at least 8 weeks after the dose of PCV13 vaccine. An adult aged 63 years or older who has certain medical conditions and previously received 1 or more doses of PPSV23 vaccine should receive 1 dose of PCV13. The PCV13 vaccine dose should be obtained 1 or more years after the last PPSV23 vaccine dose.    Pneumococcal polysaccharide (PPSV23) vaccine. When PCV13 is also indicated, PCV13 should be obtained first. All adults aged 40 years and older should be immunized. An adult younger than age 62 years who has certain medical conditions should be immunized. Any person who resides in a nursing home or long-term care facility should be immunized. An adult smoker should be immunized. People with an  immunocompromised condition and certain other conditions should receive both PCV13 and PPSV23 vaccines. People with human immunodeficiency virus (HIV) infection should be immunized as soon as possible after diagnosis. Immunization during chemotherapy or radiation therapy should be avoided. Routine use of PPSV23 vaccine is not recommended for American Indians, [place] Natives, or people younger than 65 years unless there are medical conditions that require  PPSV23 vaccine. When indicated, people who have unknown immunization and have no record of immunization should receive PPSV23 vaccine. One-time revaccination 5 years after the first dose of PPSV23 is recommended for people aged 19-64 years who have chronic kidney failure, nephrotic syndrome, asplenia, or immunocompromised conditions. People who received 1-2 doses of PPSV23 before age 6 years should receive another dose of PPSV23 vaccine at age 81 years or later if at least 5 years have passed since the previous dose. Doses of PPSV23 are not needed for people immunized with PPSV23 at or after age 25 years.   Preventive Services / Frequency  Ages 65 years and over  Blood pressure check.  Lipid and cholesterol check.  Lung cancer screening. / Every year if you are aged 72-80 years and have a 30-pack-year history of smoking and currently smoke or have quit within the past 15 years. Yearly screening is stopped once you have quit smoking for at least 15 years or develop a health problem that would prevent you from having lung cancer treatment.  Clinical breast exam.** / Every year after age 28 years.  BRCA-related cancer risk assessment.** / For women who have family members with a BRCA-related cancer (breast, ovarian, tubal, or peritoneal cancers).  Mammogram.** / Every year beginning at age 67 years and continuing for as long as you are in good health. Consult with your health care provider.  Pap test.** / Every 3 years starting at age 44 years  through age 38 or 31 years with 3 consecutive normal Pap tests. Testing can be stopped between 65 and 70 years with 3 consecutive normal Pap tests and no abnormal Pap or HPV tests in the past 10 years.  Fecal occult blood test (FOBT) of stool. / Every year beginning at age 97 years and continuing until age 31 years. You may not need to do this test if you get a colonoscopy every 10 years.  Flexible sigmoidoscopy or colonoscopy.** / Every 5 years for a flexible sigmoidoscopy or every 10 years for a colonoscopy beginning at age 3 years and continuing until age 60 years.  Hepatitis C blood test.** / For all people born from [birth year] through 1963 and any individual with known risks for hepatitis C.  Osteoporosis screening.** / A one-time screening for women ages 44 years and over and women at risk for fractures or osteoporosis.  Skin self-exam. / Monthly.  Influenza vaccine. / Every year.  Tetanus, diphtheria, and acellular pertussis (Tdap/Td) vaccine.** / 1 dose of Td every 10 years.  Zoster vaccine.** / 1 dose for adults aged 72 years or older.  Pneumococcal 13-valent conjugate (PCV13) vaccine.** / Consult your health care provider.  Pneumococcal polysaccharide (PPSV23) vaccine.** / 1 dose for all adults aged 64 years and older. Screening for abdominal aortic aneurysm (AAA)  by ultrasound is recommended for people who have history of high blood pressure or who are current or former smokers.

## 2014-10-17 NOTE — Progress Notes (Signed)
Patient ID: Claudia [name], female   DOB: March 20, 1951, 64 y.o.   MRN: [id number]  Annual Comprehensive Examination  This very nice 64 y.o. MWF presents for complete physical.  Patient has been followed for HTN, Prediabetes, Hyperlipidemia, and Vitamin D Deficiency.    HTN predates since 1996. Patient's BP has been controlled at home and patient denies any cardiac symptoms as chest pain, palpitations, shortness of breath, dizziness or ankle swelling. Patient had negative Cardiolite in 2004. She has had w/u of occas to infreq p[alpitatios and been dx/d with pAfib ands evaluated in the Afib clinic and she has declined anticoagulant therapy or consideration of Ablative therapy unless she becomes more symptomatic. Today's BP: 128/80 mmHg    Patient's hyperlipidemia is controlled with diet and medications. Patient denies myalgias or other medication SE's. Last lipids were at goal -  Cholesterol 160; HDL 49; LDL 76; Triglycerides 176 on 04/01/2014.   Patient has Morbid Obesity (BMI 40+) and consequent prediabetes predating since 2012 with A1c 5.8% and patient denies reactive hypoglycemic symptoms, visual blurring, diabetic polys, or paresthesias. Last A1c was 5.6% on 04/01/2014.      Finally, patient has history of Vitamin D Deficiency of 18 in 2008 and last Vitamin D was  63 on 04/01/2014.      Medication Sig  . aspirin 81 MG tablet Take 81 mg by mouth daily.  Marland Kitchen atenolol  50 MG tablet Take 0.5 tablets (25 mg total) by mouth daily.  . celecoxib (CELEBREX) 200 MG capsule TAKE ONE CAPSULE BY MOUTH EVERY DAY AFTER MEALS FOR PAIN AND INFLAMMATION  . VITAMIN D Take 6,000 Units by mouth daily.   Marland Kitchen CINNAMON  Take 1,000 mg by mouth 2 (two) times daily.  . DiphenhydrAMINE 25 Take 1 tablet by mouth at bedtime. For Sleep  . PREMPRO 0.625-2.5 MG  TAKE AS DIRECTED  . FLAX SEED Take 1,000 mg by mouth 2 (two) times daily.  . hctz 25 MG tablet TAKE 1 TABLET EVERY DAY  . levocetirizine (XYZAL) 5 MG tablet TAKE 1  TABLET BY MOUTH EVERY DAY  . montelukast  10 MG tablet TAKE 1 TABLET BY MOUTH EVERY DAY  . quinapril  40 MG tablet Take 1 tablet (40 mg total) by mouth daily.  . vitamin B-12  1000 MCG tablet Take 1,000 mcg by mouth daily.   Allergies  Allergen Reactions  . Ciprofloxacin   . Penicillins Other (See Comments)    Had as a child=unknown reaction  . Zoloft [Sertraline Hcl]    Past Medical History  Diagnosis Date  . Hypertension   . Hyperlipidemia   . Elevated hemoglobin A1c   . Vitamin D deficiency   . IBS (irritable bowel syndrome)   . Allergy   . Anxiety   . Obesity   . Insomnia   . Palpitations   . Achilles tendonitis     R heal, limits activity   Health Maintenance  Topic Date Due  . PAP SMEAR  10/11/1970  . TETANUS/TDAP  10/11/1971  . MAMMOGRAM  10/11/2002  . INFLUENZA VACCINE  11/04/2014  . COLONOSCOPY  11/04/2022  . ZOSTAVAX  Completed  . HIV Screening  Completed   Immunization History  Administered Date(s) Administered  . DTaP 04/05/2006  . Influenza Split 02/19/2013  . Influenza-Unspecified 04/06/2011  . PPD Test 10/15/2013  . Pneumococcal-Unspecified 04/06/1995  . Zoster 07/04/2013   Past Surgical History  Procedure Laterality Date  . Cesarean section    . Cholecystectomy  10 years ago  .  Tubal ligation Bilateral   . Cesarean section     Family History  Problem Relation Age of Onset  . Heart attack Mother   . Stroke Mother   . Heart disease Mother   . Diabetes Mother   . Macular degeneration Mother   . Heart attack Father   . Alcohol abuse Father   . Ovarian cancer Maternal Grandmother   . Colon cancer Neg Hx    History  Substance Use Topics  . Smoking status: Former Smoker    Quit date: 04/01/2004  . Smokeless tobacco: Never Used     Comment: Social smoker times 10 years  . Alcohol Use: 0.0 oz/week    0 Standard drinks or equivalent per week     Comment: rarely, < 1 beer per week    ROS Constitutional: Denies fever, chills, weight  loss/gain, headaches, insomnia,  night sweats, and change in appetite. Does c/o fatigue. Eyes: Denies redness, blurred vision, diplopia, discharge, itchy, watery eyes.  ENT: Denies discharge, congestion, post nasal drip, epistaxis, sore throat, earache, hearing loss, dental pain, Tinnitus, Vertigo, Sinus pain, snoring.  Cardio: Denies chest pain, palpitations, irregular heartbeat, syncope, dyspnea, diaphoresis, orthopnea, PND, claudication, edema Respiratory: denies cough, dyspnea, DOE, pleurisy, hoarseness, laryngitis, wheezing.  Gastrointestinal: Denies dysphagia, heartburn, reflux, water brash, pain, cramps, nausea, vomiting, bloating, diarrhea, constipation, hematemesis, melena, hematochezia, jaundice, hemorrhoids Genitourinary: Denies dysuria, frequency, urgency, nocturia, hesitancy, discharge, hematuria, flank pain Breast: Breast lumps, nipple discharge, bleeding.  Musculoskeletal: Denies arthralgia, myalgia, stiffness, Jt. Swelling, pain, limp, and strain/sprain. Denies falls. Skin: Denies puritis, rash, hives, warts, acne, eczema, changing in skin lesion Neuro: No weakness, tremor, incoordination, spasms, paresthesia, pain Psychiatric: Denies confusion, memory loss, sensory loss. Denies Depression. Endocrine: Denies change in weight, skin, hair change, nocturia, and paresthesia, diabetic polys, visual blurring, hyper / hypo glycemic episodes.  Heme/Lymph: No excessive bleeding, bruising, enlarged lymph nodes.  Physical Exam  BP 128/80   Pulse 56  Temp 97 F   Resp 16  Ht 5\' 7"  Wt 259 lb     BMI 40.56   General Appearance: Well nourished and in no apparent distress. Eyes: PERRLA, EOMs, conjunctiva no swelling or erythema, normal fundi and vessels. Sinuses: No frontal/maxillary tenderness ENT/Mouth: EACs patent / TMs  nl. Nares clear without erythema, swelling, mucoid exudates. Oral hygiene is good. No erythema, swelling, or exudate. Tongue normal, non-obstructing. Tonsils not  swollen or erythematous. Hearing normal.  Neck: Supple, thyroid normal. No bruits, nodes or JVD. Respiratory: Respiratory effort normal.  BS equal and clear bilateral without rales, rhonci, wheezing or stridor. Cardio: Heart sounds are normal with regular rate and rhythm and no murmurs, rubs or gallops. Peripheral pulses are normal and equal bilaterally without edema. No aortic or femoral bruits. Chest: symmetric with normal excursions and percussion. Breasts: Symmetric, without lumps, nipple discharge, retractions, or fibrocystic changes.  Abdomen: Flat, soft, with bowel sounds. Nontender, no guarding, rebound, hernias, masses, or organomegaly.  Lymphatics: Non tender without lymphadenopathy.  Musculoskeletal: Full ROM all peripheral extremities, joint stability, 5/5 strength, and normal gait. Skin: Warm and dry without rashes, lesions, cyanosis, clubbing or  ecchymosis.  Neuro: Cranial nerves intact, reflexes equal bilaterally. Normal muscle tone, no cerebellar symptoms. Sensation intact.  Pysch: Awake and oriented X 3, normal affect, Insight and Judgment appropriate.   Assessment and Plan  1. Essential hypertension  - Microalbumin / creatinine urine ratio - EKG 12-Lead - [place], RETROPERITNL ABD,  LTD - TSH  2. Hyperlipidemia  - Lipid panel  3.  Elevated hemoglobin A1c  - Hemoglobin A1c - Insulin, random  4. Vitamin D deficiency  - Vit D  25 hydroxy   5. Morbid obesity (BMI 39)   6. Paroxysmal atrial fibrillation   7. Screening for rectal cancer  - POC Hemoccult Bld/Stl   8. Other fatigue  - Vitamin B12 - Iron and TIBC - TSH  9. Encounter for long-term (current) use of medications  - Urine Microscopic - CBC with Differential/Platelet - BASIC METABOLIC PANEL WITH GFR - Hepatic function panel - Magnesium   Continue prudent diet as discussed, weight control, BP monitoring, regular exercise, and medications. Discussed med's effects and SE's. Screening labs and  tests as requested with regular follow-up as recommended.  Over 40 minutes of exam, counseling, chart review was performed.

## 2014-10-18 LAB — VITAMIN D 25 HYDROXY (VIT D DEFICIENCY, FRACTURES): Vit D, 25-Hydroxy: 54 ng/mL (ref 30–100)

## 2014-10-18 LAB — MICROALBUMIN / CREATININE URINE RATIO
Creatinine, Urine: 77.5 mg/dL
MICROALB/CREAT RATIO: 2.6 mg/g (ref 0.0–30.0)
Microalb, Ur: 0.2 mg/dL (ref <2.0)

## 2014-10-18 LAB — URINALYSIS, MICROSCOPIC ONLY
Bacteria, UA: NONE SEEN
Casts: NONE SEEN
Crystals: NONE SEEN
Squamous Epithelial / LPF: NONE SEEN

## 2014-10-18 LAB — INSULIN, RANDOM: Insulin: 50.5 u[IU]/mL — ABNORMAL HIGH (ref 2.0–19.6)

## 2014-10-29 ENCOUNTER — Other Ambulatory Visit: Payer: Self-pay | Admitting: *Deleted

## 2014-10-29 MED ORDER — CELECOXIB 200 MG PO CAPS: ORAL_CAPSULE | ORAL | Status: DC

## 2015-01-03 ENCOUNTER — Other Ambulatory Visit: Payer: Self-pay | Admitting: Physician Assistant

## 2015-01-03 ENCOUNTER — Other Ambulatory Visit: Payer: Self-pay | Admitting: Internal Medicine

## 2015-01-03 DIAGNOSIS — R197 Diarrhea, unspecified: Secondary | ICD-10-CM

## 2015-01-24 ENCOUNTER — Other Ambulatory Visit: Payer: Self-pay | Admitting: Internal Medicine

## 2015-02-04 ENCOUNTER — Encounter: Payer: Self-pay | Admitting: Internal Medicine

## 2015-02-04 ENCOUNTER — Ambulatory Visit (INDEPENDENT_AMBULATORY_CARE_PROVIDER_SITE_OTHER): Admitting: Internal Medicine

## 2015-02-04 VITALS — BP 126/78 | HR 78 | Temp 98.2°F | Resp 18 | Ht 67.0 in | Wt 269.0 lb

## 2015-02-04 DIAGNOSIS — J069 Acute upper respiratory infection, unspecified: Secondary | ICD-10-CM

## 2015-02-04 MED ORDER — BENZONATATE 200 MG PO CAPS: 200.0000 mg | ORAL_CAPSULE | Freq: Three times a day (TID) | ORAL | Status: DC | PRN

## 2015-02-04 MED ORDER — PROMETHAZINE-DM 6.25-15 MG/5ML PO SYRP: ORAL_SOLUTION | ORAL | Status: DC

## 2015-02-04 MED ORDER — TRIAMCINOLONE ACETONIDE 55 MCG/ACT NA AERO: 2.0000 | INHALATION_SPRAY | Freq: Every day | NASAL | Status: DC

## 2015-02-04 MED ORDER — AZITHROMYCIN 250 MG PO TABS: ORAL_TABLET | ORAL | Status: DC

## 2015-02-04 NOTE — Patient Instructions (Addendum)
You can take pepcid or zantac twice daily to help with congestion.  Continue sigulair and xyzal  Please use saline as often as tolerated.  Use nasacort 2 sprays per nostril right before bed.  Continue mucinex during the day.  Take tessalon for cough during the day.  Take promethazine syrup at night for coughing.  Please take zpak in 2  Days if you have no relief.   Take tylenol or ibuprofen as needed for chest soreness.

## 2015-02-04 NOTE — Progress Notes (Signed)
Patient ID: Claudia [name], female   DOB: 1950-10-14, 64 y.o.   MRN: [id number]  HPI  Patient presents to the office for evaluation of cough.  It has been going on for 1 weeks.  Patient reports wet cough with some yellow color to it with chest congestion.  They also endorse chills, postnasal drip, sputum production and nasal congestion, mild rhinorrhea, sinus pressure.  .  They have tried zinc and mucinex.  They report that nothing has worked.  They admits to other sick contacts.  She reports that her family has all had very similar symptoms.    Review of Systems  Constitutional: Negative for fever, chills and malaise/fatigue.  HENT: Positive for congestion. Negative for ear pain and sore throat.   Skin: Negative.   Neurological: Negative for headaches.    PE:  General:  Alert and non-toxic, WDWN, NAD HEENT: NCAT, PERLA, EOM normal, no occular discharge or erythema.  Nasal mucosal edema with sinus tenderness to palpation.  Oropharynx clear with minimal oropharyngeal edema and erythema.  Mucous membranes moist and pink. Neck:  Cervical adenopathy Chest:  RRR no MRGs.  Lungs clear to auscultation A&P with no wheezes rhonchi or rales.   Abdomen: +BS x 4 quadrants, soft, non-tender, no guarding, rigidity, or rebound. Skin: warm and dry no rash Neuro: A&Ox4, CN II-XII grossly intact  Assessment and Plan:   1. Acute URI -nasal saline -cont xyzal -cont singulair - promethazine-dextromethorphan (PROMETHAZINE-DM) 6.25-15 MG/5ML syrup; Please take 5 mL as needed for severe coughing at night time.  Dispense: 360 mL; Refill: 1 - benzonatate (TESSALON) 200 MG capsule; Take 1 capsule (200 mg total) by mouth 3 (three) times daily as needed for cough.  Dispense: 30 capsule; Refill: 1 - triamcinolone (NASACORT ALLERGY 24HR) 55 MCG/ACT AERO nasal inhaler; Place 2 sprays into the nose daily.  Dispense: 1 Inhaler; Refill: 12 - azithromycin (ZITHROMAX Z-PAK) 250 MG tablet; 2 po day one, then 1 daily x 4  days  Dispense: 5 tablet; Refill: 0

## 2015-02-22 ENCOUNTER — Other Ambulatory Visit: Payer: Self-pay | Admitting: Physician Assistant

## 2015-03-12 ENCOUNTER — Encounter: Payer: Self-pay | Admitting: Internal Medicine

## 2015-03-12 ENCOUNTER — Ambulatory Visit (INDEPENDENT_AMBULATORY_CARE_PROVIDER_SITE_OTHER): Admitting: Internal Medicine

## 2015-03-12 VITALS — BP 130/78 | HR 72 | Temp 97.5°F | Resp 16 | Ht 67.0 in | Wt 264.4 lb

## 2015-03-12 DIAGNOSIS — G9001 Carotid sinus syncope: Secondary | ICD-10-CM | POA: Diagnosis not present

## 2015-03-12 DIAGNOSIS — R7309 Other abnormal glucose: Secondary | ICD-10-CM

## 2015-03-12 DIAGNOSIS — Z23 Encounter for immunization: Secondary | ICD-10-CM

## 2015-03-13 ENCOUNTER — Other Ambulatory Visit: Payer: Self-pay | Admitting: Physician Assistant

## 2015-03-16 ENCOUNTER — Encounter: Payer: Self-pay | Admitting: Internal Medicine

## 2015-03-16 NOTE — Progress Notes (Signed)
Subjective:    Patient ID: Claudia [name], female    DOB: 02-Jul-1950, 64 y.o.   MRN: [id number]  HPI  This very nice 64 yo MWF presents with c/o query pain/discomfort of the left neck occuring intermittently over the last 3-4 months and usually at nite betw 9-11 pm. She has not noticed and aggravating or alleviating factors, nor association with neck position or chewing and further denies HA or any neuro symptoms.   Medication Sig  . aspirin 81 MG Take 81 mg by mouth daily.  Marland Kitchen atenolol  50 MG  Take 0.5 tablets (25 mg total) by mouth daily.  . celecoxib  200 MG  TAKE ONE CAPSULE BY MOUTH EVERY DAY AFTER MEALS FOR PAIN AND INFLAMMATION  . VITAMIN D Take 6,000 Units by mouth daily.   Marland Kitchen CINNAMON PO Take 1,000 mg by mouth 2 (two) times daily.  . DiphenhydrAMINE 25 mg Take 1 tablet by mouth at bedtime. For Sleep  . LOMOTIL 2.5-0.025 MG TAKE 1 TABLET BY MOUTH 4 TIMES A DAY AS NEEDED FOR DIAHHREA  . PREMPRO 0.625-2.5  TAKE AS DIRECTED  . FLAX SEEDS Take 1,000 mg by mouth 2 (two) times daily.  Marland Kitchen levocetirizine  5 MG  TAKE 1 TABLET BY MOUTH EVERY DAY  . quinapril  40 MG t Take 1 tablet (40 mg total) by mouth daily.  Marland Kitchen triamcinolone (NASACORT) 55  nasal inhaler Place 2 sprays into the nose daily.  . hctz 25 MG tablet TAKE 1 TABLET EVERY DAY  . montelukast (SINGULAIR) 10 MG tablet TAKE 1 TABLET BY MOUTH EVERY DAY  . vitamin B-12  1000 MCG tablet Take 1,000 mcg by mouth daily.   Allergies  Allergen Reactions  . Ciprofloxacin   . Penicillins Other (See Comments)    Had as a child=unknown reaction  . Zoloft [Sertraline Hcl]    Past Medical History  Diagnosis Date  . Hypertension   . Hyperlipidemia   . Elevated hemoglobin A1c   . Vitamin D deficiency   . IBS (irritable bowel syndrome)   . Allergy   . Anxiety   . Obesity   . Insomnia   . Palpitations   . Achilles tendonitis     R heal, limits activity   Review of Systems  10 point systems review negative except as above.    Objective:    Physical Exam  BP 130/78 mmHg  Pulse 72  Temp(Src) 97.5 F (36.4 C)  Resp 16  Ht 5\' 7"  (1.702 m)  Wt 264 lb 6.4 oz (119.931 kg)  BMI 41.40 kg/m2  DSkin clear w/o exposed rash or lesions.   HEENT - Eac's patent. TM's Nl. No TMJ tenderness.  EOM's full. PERRLA. NasoOroPharynx clear. Neck - supple. Nl Thyroid. Carotids 2+ & No bruits, nodes, JVD. There is sl tenderness with pressure on the Left Carotid which is the point of her discomfort.  Chest - Clear equal BS w/o Rales, rhonchi, wheezes. Cor - Nl HS. RRR w/o sig MGR. PP 1(+). No edema. MS- FROM w/o deformities. Muscle power, tone and bulk Nl. Gait Nl. Neuro - No obvious Cr N abnormalities. Sensory, motor and Cerebellar functions appear Nl w/o focal abnormalities.    Assessment & Plan:    1. Carotidynia  - Reassured  - Recommended a short trial on prednisone, but patient indicated that the discomfort was not that severe & that she just wanted to be checked for cancer.   2. Need for prophylactic vaccination and inoculation against influenza  -  Flu vaccine greater than or equal to 3yo preservative free IM  3. Morbid obesity, unspecified obesity type ([place])  Over 15-20 minutes of exam, counseling, chart review and  critical decision making was performed

## 2015-04-08 ENCOUNTER — Other Ambulatory Visit: Payer: Self-pay | Admitting: Internal Medicine

## 2015-05-02 ENCOUNTER — Ambulatory Visit: Payer: Self-pay | Admitting: Internal Medicine

## 2015-05-18 ENCOUNTER — Encounter: Payer: Self-pay | Admitting: Internal Medicine

## 2015-05-18 NOTE — Progress Notes (Signed)
Patient ID: Claudia [name], female   DOB: 1950/12/11, 65 y.o.   MRN: [id number] R  E  S   H  E  D  U  L  E  D

## 2015-05-19 ENCOUNTER — Ambulatory Visit: Payer: Self-pay | Admitting: Internal Medicine

## 2015-05-29 ENCOUNTER — Other Ambulatory Visit: Payer: Self-pay | Admitting: Physician Assistant

## 2015-06-11 ENCOUNTER — Other Ambulatory Visit: Payer: Self-pay | Admitting: Cardiovascular Disease

## 2015-06-24 ENCOUNTER — Encounter: Payer: Self-pay | Admitting: Internal Medicine

## 2015-06-24 ENCOUNTER — Ambulatory Visit (INDEPENDENT_AMBULATORY_CARE_PROVIDER_SITE_OTHER): Admitting: Internal Medicine

## 2015-06-24 VITALS — BP 102/70 | HR 60 | Temp 97.3°F | Resp 16 | Ht 67.0 in | Wt 262.6 lb

## 2015-06-24 DIAGNOSIS — R7303 Prediabetes: Secondary | ICD-10-CM | POA: Diagnosis not present

## 2015-06-24 DIAGNOSIS — Z79899 Other long term (current) drug therapy: Secondary | ICD-10-CM | POA: Diagnosis not present

## 2015-06-24 DIAGNOSIS — E785 Hyperlipidemia, unspecified: Secondary | ICD-10-CM

## 2015-06-24 DIAGNOSIS — E559 Vitamin D deficiency, unspecified: Secondary | ICD-10-CM

## 2015-06-24 DIAGNOSIS — I10 Essential (primary) hypertension: Secondary | ICD-10-CM | POA: Diagnosis not present

## 2015-06-24 LAB — LIPID PANEL
CHOL/HDL RATIO: 3.4 ratio (ref <=5.0)
CHOLESTEROL: 158 mg/dL (ref 125–200)
HDL: 46 mg/dL (ref >=46)
LDL Cholesterol: 80 mg/dL (ref <130)
Triglycerides: 162 mg/dL — ABNORMAL HIGH (ref <150)
VLDL: 32 mg/dL — AB (ref <30)

## 2015-06-24 LAB — CBC WITH DIFFERENTIAL/PLATELET
BASOS PCT: 1 % (ref 0–1)
Basophils Absolute: 0.1 10*3/uL (ref 0.0–0.1)
EOS ABS: 0.3 10*3/uL (ref 0.0–0.7)
EOS PCT: 5 % (ref 0–5)
HCT: 41 % (ref 36.0–46.0)
Hemoglobin: 13.5 g/dL (ref 12.0–15.0)
Lymphocytes Relative: 31 % (ref 12–46)
Lymphs Abs: 2 10*3/uL (ref 0.7–4.0)
MCH: 28.9 pg (ref 26.0–34.0)
MCHC: 32.9 g/dL (ref 30.0–36.0)
MCV: 87.8 fL (ref 78.0–100.0)
MONO ABS: 0.7 10*3/uL (ref 0.1–1.0)
MONOS PCT: 11 % (ref 3–12)
MPV: 10.6 fL (ref 8.6–12.4)
Neutro Abs: 3.3 10*3/uL (ref 1.7–7.7)
Neutrophils Relative %: 52 % (ref 43–77)
PLATELETS: 308 10*3/uL (ref 150–400)
RBC: 4.67 MIL/uL (ref 3.87–5.11)
RDW: 13.8 % (ref 11.5–15.5)
WBC: 6.4 10*3/uL (ref 4.0–10.5)

## 2015-06-24 LAB — TSH: TSH: 1.47 m[IU]/L

## 2015-06-24 LAB — HEPATIC FUNCTION PANEL
ALK PHOS: 61 U/L (ref 33–130)
ALT: 17 U/L (ref 6–29)
AST: 22 U/L (ref 10–35)
Albumin: 4 g/dL (ref 3.6–5.1)
BILIRUBIN DIRECT: 0.1 mg/dL (ref <=0.2)
BILIRUBIN TOTAL: 0.7 mg/dL (ref 0.2–1.2)
Indirect Bilirubin: 0.6 mg/dL (ref 0.2–1.2)
Total Protein: 7.4 g/dL (ref 6.1–8.1)

## 2015-06-24 LAB — BASIC METABOLIC PANEL WITH GFR
BUN: 15 mg/dL (ref 7–25)
CO2: 26 mmol/L (ref 20–31)
CREATININE: 0.98 mg/dL (ref 0.50–0.99)
Calcium: 9.2 mg/dL (ref 8.6–10.4)
Chloride: 102 mmol/L (ref 98–110)
GFR, Est African American: 71 mL/min (ref >=60)
GFR, Est Non African American: 62 mL/min (ref >=60)
Glucose, Bld: 96 mg/dL (ref 65–99)
Potassium: 4.3 mmol/L (ref 3.5–5.3)
SODIUM: 137 mmol/L (ref 135–146)

## 2015-06-24 NOTE — Progress Notes (Signed)
Assessment and Plan:  Hypertension:  -mild hypotension -if continued at home may need to decrease BP meds -Continue medication,  -monitor blood pressure at home.  -Continue DASH diet.   -Reminder to go to the ER if any CP, SOB, nausea, dizziness, severe HA, changes vision/speech, left arm numbness and tingling, and jaw pain.  Cholesterol: -Continue diet and exercise.  -Check cholesterol.   Pre-diabetes: -Continue diet and exercise.  -Check A1C  Vitamin D Def: -check level -continue medications.   Ecchymosis to Right great toe -no concerns for circulation issues -2+ pulses -normal cap refill  Continue diet and meds as discussed. Further disposition pending results of labs.  HPI 65 y.o. female  presents for 3 month follow up with hypertension, hyperlipidemia, prediabetes and vitamin D.   Her blood pressure has been controlled at home, today their BP is BP: 102/70 mmHg.   She does workout. She denies chest pain, shortness of breath, dizziness.   She is not on cholesterol medication and denies myalgias. Her cholesterol is at goal. The cholesterol last visit was:   Lab Results  Component Value Date   CHOL 152 10/17/2014   HDL 53 10/17/2014   LDLCALC 53 10/17/2014   TRIG 231* 10/17/2014   CHOLHDL 2.9 10/17/2014     She has been working on diet and exercise for prediabetes, and denies foot ulcerations, hyperglycemia, hypoglycemia , increased appetite, nausea, paresthesia of the feet, polydipsia, polyuria, visual disturbances, vomiting and weight loss. Last A1C in the office was:  Lab Results  Component Value Date   HGBA1C 5.8* 10/17/2014    Patient is on Vitamin D supplement.  Lab Results  Component Value Date   VD25OH 48 10/17/2014     She recent had a trip to [place].  She reports that she was on her feet all day and she has had some bruising from Goofy stepping on her feet and also has had some swelling in her right foot and ankle.  She feels like she does tend to  get some tingles in her hips and also in her feet.  She did have mild aching in her feet.  She does not tend to get pain in her calves when walking.  She does not get a lot of swelling in her ankles and feet consistently.     Current Medications:  Current Outpatient Prescriptions on File Prior to Visit  Medication Sig Dispense Refill  . aspirin 81 MG tablet Take 81 mg by mouth daily.    Marland Kitchen atenolol (TENORMIN) 50 MG tablet TAKE ONE-HALF (1/2) TABLET DAILY 15 tablet 0  . celecoxib (CELEBREX) 200 MG capsule TAKE 1 CAPSULE BY MOUTH EVERY DAY 90 capsule 0  . Cholecalciferol (VITAMIN D-3 PO) Take 6,000 Units by mouth daily.     Marland Kitchen CINNAMON PO Take 1,000 mg by mouth 2 (two) times daily.    . DiphenhydrAMINE HCl (BENADRYL PO) Take 1 tablet by mouth at bedtime. For Sleep    . diphenoxylate-atropine (LOMOTIL) 2.5-0.025 MG tablet TAKE 1 TABLET BY MOUTH 4 TIMES A DAY AS NEEDED FOR DIAHHREA 30 tablet 0  . Flaxseed, Linseed, (FLAX SEEDS PO) Take 1,000 mg by mouth 2 (two) times daily.    . hydrochlorothiazide (HYDRODIURIL) 25 MG tablet TAKE 1 TABLET DAILY 90 tablet 1  . levocetirizine (XYZAL) 5 MG tablet TAKE 1 TABLET BY MOUTH EVERY DAY 90 tablet 3  . montelukast (SINGULAIR) 10 MG tablet TAKE 1 TABLET DAILY 90 tablet 3  . PREMPRO 0.625-2.5 MG tablet TAKE  AS DIRECTED 84 tablet 99  . quinapril (ACCUPRIL) 40 MG tablet TAKE 1 TABLET DAILY 90 tablet 2   No current facility-administered medications on file prior to visit.    Medical History:  Past Medical History  Diagnosis Date  . Hypertension   . Hyperlipidemia   . Elevated hemoglobin A1c   . Vitamin D deficiency   . IBS (irritable bowel syndrome)   . Allergy   . Anxiety   . Obesity   . Insomnia   . Palpitations   . Achilles tendonitis     R heal, limits activity    Allergies:  Allergies  Allergen Reactions  . Ciprofloxacin   . Penicillins Other (See Comments)    Had as a child=unknown reaction  . Zoloft [Sertraline Hcl]      Review of  Systems:  Review of Systems  Constitutional: Negative for fever, chills and malaise/fatigue.  HENT: Negative for congestion, ear pain and sore throat.   Eyes: Negative.   Respiratory: Negative for cough, shortness of breath and wheezing.   Cardiovascular: Negative for chest pain, palpitations and leg swelling.  Gastrointestinal: Negative for heartburn, abdominal pain, diarrhea, constipation, blood in stool and melena.  Genitourinary: Negative.   Neurological: Negative for dizziness, sensory change, loss of consciousness and headaches.  Psychiatric/Behavioral: Negative for depression. The patient is not nervous/anxious and does not have insomnia.     Family history- Review and unchanged  Social history- Review and unchanged  Physical Exam: BP 102/70 mmHg  Pulse 60  Temp(Src) 97.3 F (36.3 C)  Resp 16  Ht 5\' 7"  (1.702 m)  Wt 262 lb 9.6 oz (119.115 kg)  BMI 41.12 kg/m2 Wt Readings from Last 3 Encounters:  06/24/15 262 lb 9.6 oz (119.115 kg)  03/12/15 264 lb 6.4 oz (119.931 kg)  02/04/15 269 lb (122.018 kg)    General Appearance: Well nourished well developed, in no apparent distress. Eyes: PERRLA, EOMs, conjunctiva no swelling or erythema ENT/Mouth: Ear canals normal without obstruction, swelling, erythma, discharge.  TMs normal bilaterally.  Oropharynx moist, clear, without exudate, or postoropharyngeal swelling. Neck: Supple, thyroid normal,no cervical adenopathy  Respiratory: Respiratory effort normal, Breath sounds clear A&P without rhonchi, wheeze, or rale.  No retractions, no accessory usage. Cardio: RRR with no MRGs. Brisk peripheral pulses without edema.  Abdomen: Soft, + BS,  Non tender, no guarding, rebound, hernias, masses. Musculoskeletal: Full ROM, 5/5 strength, Normal gait Skin: Warm, dry without rashes, lesions, ecchymosis.  Neuro: Awake and oriented X 3, Cranial nerves intact. Normal muscle tone, no cerebellar symptoms. Psych: Normal affect, Insight and Judgment  appropriate.    Starlyn Skeans, PA-C 11:46 AM [facility]

## 2015-06-25 LAB — HEMOGLOBIN A1C
HEMOGLOBIN A1C: 5.7 % — AB (ref <5.7)
MEAN PLASMA GLUCOSE: 117 mg/dL — AB (ref <117)

## 2015-07-07 ENCOUNTER — Other Ambulatory Visit: Payer: Self-pay | Admitting: *Deleted

## 2015-07-07 MED ORDER — CELECOXIB 200 MG PO CAPS: ORAL_CAPSULE | ORAL | Status: DC

## 2015-08-11 ENCOUNTER — Other Ambulatory Visit: Payer: Self-pay | Admitting: *Deleted

## 2015-08-11 ENCOUNTER — Other Ambulatory Visit: Payer: Self-pay | Admitting: Cardiovascular Disease

## 2015-08-11 MED ORDER — ATENOLOL 50 MG PO TABS: 25.0000 mg | ORAL_TABLET | Freq: Every day | ORAL | Status: DC

## 2015-09-07 ENCOUNTER — Other Ambulatory Visit: Payer: Self-pay | Admitting: Internal Medicine

## 2015-09-09 NOTE — Progress Notes (Signed)
Primary Care Physician: Alesia Richards, MD Referring Physician:  Dr Claudia [name] is a 65 y.o. female with a h/o htn, obesity, and palpitations who presents today for f/u palpitations   She reports having intermittent palpitations for "years".  She wore an event monitor this last spring.  This documented frequent pacs as well as rare nonsustained atrial tachycardia.  She also has had short episodes of rate controlled afib.  There was one  regular and prolonged SVT which is likely atrial flutter.  She has had good response to atenolol to manage palpitations. She only will notice palpitations now, sometime at bedtime, and it feels like a thump, more consistent with a PC.  On last visit, she declined further treatment with procedures/AAD/blood thinners. She has a chadsvasc score of at least two but she still declines blood thinners.. Has to take Celebrex daily for arthritis pain and she feels her quality of life will suffer if she has to stop this drug.  Weight is up another 10 lbs since last visit with PA 04/2014.  She comfort eats and likes sweets/ carbs Activity somewhat limited by arthritis Snores likely has sleep apnea but the thought of testing and wearing CPAP not appealing to her   She states she has some intermittent dizziness and heart rate is 45 bpm. Discussed decreasing BB to 1/2 tab a day.  Today, she denies symptoms of chest pain, shortness of breath, orthopnea, PND, lower extremity edema, dizziness, presyncope, syncope, or neurologic sequela.  + snoring.   The patient is otherwise without complaint today.   Past Medical History  Diagnosis Date  . Hypertension   . Hyperlipidemia   . Elevated hemoglobin A1c   . Vitamin D deficiency   . IBS (irritable bowel syndrome)   . Allergy   . Anxiety   . Obesity   . Insomnia   . Palpitations   . Achilles tendonitis     R heal, limits activity   Past Surgical History  Procedure Laterality Date  . Cesarean section     . Cholecystectomy  10 years ago  . Tubal ligation Bilateral   . Cesarean section      Current Outpatient Prescriptions  Medication Sig Dispense Refill  . aspirin 81 MG tablet Take 81 mg by mouth daily.    Marland Kitchen atenolol (TENORMIN) 50 MG tablet Take 0.5 tablets (25 mg total) by mouth daily. 45 tablet 3  . atenolol (TENORMIN) 50 MG tablet Take 0.5 tablets (25 mg total) by mouth daily. 15 tablet 0  . celecoxib (CELEBREX) 200 MG capsule TAKE 1 CAPSULE BY MOUTH EVERY DAY 90 capsule 3  . Cholecalciferol (VITAMIN D-3 PO) Take 6,000 Units by mouth daily.     Marland Kitchen CINNAMON PO Take 1,000 mg by mouth 2 (two) times daily.    . DiphenhydrAMINE HCl (BENADRYL PO) Take 1 tablet by mouth at bedtime. For Sleep    . Flaxseed, Linseed, (FLAX SEEDS PO) Take 1,000 mg by mouth 2 (two) times daily.    . hydrochlorothiazide (HYDRODIURIL) 25 MG tablet TAKE 1 TABLET DAILY 90 tablet 0  . levocetirizine (XYZAL) 5 MG tablet TAKE 1 TABLET BY MOUTH EVERY DAY 90 tablet 3  . montelukast (SINGULAIR) 10 MG tablet TAKE 1 TABLET DAILY 90 tablet 3  . PREMPRO 0.625-2.5 MG tablet TAKE AS DIRECTED 84 tablet 99  . quinapril (ACCUPRIL) 40 MG tablet TAKE 1 TABLET DAILY 90 tablet 2   No current facility-administered medications for this visit.  Allergies  Allergen Reactions  . Ciprofloxacin   . Penicillins Other (See Comments)    Had as a child=unknown reaction  . Zoloft [Sertraline Hcl]     Social History   Social History  . Marital Status: Unknown    Spouse Name: N/A  . Number of Children: N/A  . Years of Education: N/A   Occupational History  . Not on file.   Social History Main Topics  . Smoking status: Former Smoker    Quit date: 04/01/2004  . Smokeless tobacco: Never Used     Comment: Social smoker times 10 years  . Alcohol Use: 0.0 oz/week    0 Standard drinks or equivalent per week     Comment: rarely, < 1 beer per week  . Drug Use: No  . Sexual Activity: Not on file   Other Topics Concern  . Not on  file   Social History Narrative   Pt lives in [place] with spouse.  Retired from [employer].    Family History  Problem Relation Age of Onset  . Heart attack Mother   . Stroke Mother   . Heart disease Mother   . Diabetes Mother   . Macular degeneration Mother   . Heart attack Father   . Alcohol abuse Father   . Ovarian cancer Maternal Grandmother   . Colon cancer Neg Hx     ROS-  systems are reviewed and negative except as per the HPI   Physical Exam: Filed Vitals:   09/11/15 1032  BP: 140/90  Pulse: 59  Height: 5\' 8"  (1.727 m)  Weight: 120.566 kg (265 lb 12.8 oz)  SpO2: 99%    GEN- The patient is overweight appearing, alert and oriented x 3 today.   Head- normocephalic, atraumatic Eyes-  Sclera clear, conjunctiva pink Ears- hearing intact Oropharynx- clear Neck- supple, no JVP Lymph- no cervical lymphadenopathy Lungs- Clear to ausculation bilaterally, normal work of breathing Heart- Slow,regular rate and rhythm, no murmurs, rubs or gallops, PMI not laterally displaced GI- soft, NT, ND, + BS Extremities- no clubbing, cyanosis, or edema MS- no significant deformity or atrophy Skin- no rash or lesion Psych- euthymic mood, full affect Neuro- strength and sensation are intact  EKG  10/17/14   sinus bradycardia 45 bpm, otherwise normal ekg  09/11/15  SR rate 52  Normal    Assessment and Plan:  1. Palpitations   Previous Event monitor   Revealed  frequent pacs, rare and short nonsustained atrial tachycardia, short afib, and also a more prolonged regular SVT which is likely atrial flutter. Therapeutic strategies for her atrial arrhythmias including medicine and ablation were  Previously discussed in detail with the patient by Dr. Rayann Heman, which were reviewed today.   At this time, she is clear that she would like to avoid additional medicine or ablations.  She feels that atenolol is reasonably controlling her symptoms, which she only notices a few "thumps" when  trying to go to sleep.Marland Kitchen  She would like to focus on lifestyle modification. chads2vasc score is at least 2.  She declines anticoagulation at this time.  Baseline HR low keep on 1/2 25 mg tab of atenolol   2. Obesity Body mass index is 40.42 kg/(m^2). I spent a prolonged period today discussion lifestyle modification.  She states that she has lost up to 80 lbs previously and feels that she has the resources that she needs to lose weight. Discussed low carb diet   3. Symptomatic sinus bradycardia Better on  1/2 dose atenolol   4. Snoring She declines sleep study today and would not likely wear CPAP Correlation of OSA and PAF and arrhythmias discussed    F/u 6 months    Jenkins Rouge

## 2015-09-11 ENCOUNTER — Encounter: Payer: Self-pay | Admitting: Cardiovascular Disease

## 2015-09-11 ENCOUNTER — Ambulatory Visit (INDEPENDENT_AMBULATORY_CARE_PROVIDER_SITE_OTHER): Admitting: Cardiovascular Disease

## 2015-09-11 VITALS — BP 140/90 | HR 59 | Ht 68.0 in | Wt 265.8 lb

## 2015-09-11 DIAGNOSIS — I10 Essential (primary) hypertension: Secondary | ICD-10-CM

## 2015-09-11 MED ORDER — ATENOLOL 50 MG PO TABS: 25.0000 mg | ORAL_TABLET | Freq: Every day | ORAL | Status: DC

## 2015-09-11 NOTE — Patient Instructions (Addendum)

## 2015-09-22 ENCOUNTER — Telehealth: Payer: Self-pay | Admitting: *Deleted

## 2015-09-22 NOTE — Telephone Encounter (Signed)
Patient requested a refill for Lomotil, but Dr Melford Aase advised patient to take OTC Imodium up to 12 tablets daily for diarrhea.

## 2015-11-04 ENCOUNTER — Ambulatory Visit (INDEPENDENT_AMBULATORY_CARE_PROVIDER_SITE_OTHER): Admitting: Internal Medicine

## 2015-11-04 ENCOUNTER — Encounter: Payer: Self-pay | Admitting: Internal Medicine

## 2015-11-04 VITALS — BP 138/80 | HR 54 | Temp 97.7°F | Ht 67.5 in | Wt 266.6 lb

## 2015-11-04 DIAGNOSIS — Z136 Encounter for screening for cardiovascular disorders: Secondary | ICD-10-CM

## 2015-11-04 DIAGNOSIS — Z1212 Encounter for screening for malignant neoplasm of rectum: Secondary | ICD-10-CM

## 2015-11-04 DIAGNOSIS — I10 Essential (primary) hypertension: Secondary | ICD-10-CM | POA: Diagnosis not present

## 2015-11-04 DIAGNOSIS — R5383 Other fatigue: Secondary | ICD-10-CM

## 2015-11-04 DIAGNOSIS — Z79899 Other long term (current) drug therapy: Secondary | ICD-10-CM | POA: Diagnosis not present

## 2015-11-04 DIAGNOSIS — Z0000 Encounter for general adult medical examination without abnormal findings: Secondary | ICD-10-CM

## 2015-11-04 DIAGNOSIS — I480 Paroxysmal atrial fibrillation: Secondary | ICD-10-CM

## 2015-11-04 DIAGNOSIS — R7303 Prediabetes: Secondary | ICD-10-CM

## 2015-11-04 DIAGNOSIS — E559 Vitamin D deficiency, unspecified: Secondary | ICD-10-CM | POA: Diagnosis not present

## 2015-11-04 DIAGNOSIS — E785 Hyperlipidemia, unspecified: Secondary | ICD-10-CM

## 2015-11-04 DIAGNOSIS — Z0001 Encounter for general adult medical examination with abnormal findings: Secondary | ICD-10-CM

## 2015-11-04 NOTE — Patient Instructions (Signed)
Preventive Care for Adults  A healthy lifestyle and preventive care can promote health and wellness. Preventive health guidelines for women include the following key practices.  A routine yearly physical is a good way to check with your health care provider about your health and preventive screening. It is a chance to share any concerns and updates on your health and to receive a thorough exam.  Visit your dentist for a routine exam and preventive care every 6 months. Brush your teeth twice a day and floss once a day. Good oral hygiene prevents tooth decay and gum disease.  The frequency of eye exams is based on your age, health, family medical history, use of contact lenses, and other factors. Follow your health care provider's recommendations for frequency of eye exams.  Eat a healthy diet. Foods like vegetables, fruits, whole grains, low-fat dairy products, and lean protein foods contain the nutrients you need without too many calories. Decrease your intake of foods high in solid fats, added sugars, and salt. Eat the right amount of calories for you.Get information about a proper diet from your health care provider, if necessary.  Regular physical exercise is one of the most important things you can do for your health. Most adults should get at least 150 minutes of moderate-intensity exercise (any activity that increases your heart rate and causes you to sweat) each week. In addition, most adults need muscle-strengthening exercises on 2 or more days a week.  Maintain a healthy weight. The body mass index (BMI) is a screening tool to identify possible weight problems. It provides an estimate of body fat based on height and weight. Your health care provider can find your BMI and can help you achieve or maintain a healthy weight.For adults 20 years and older:  A BMI below 18.5 is considered underweight.  A BMI of 18.5 to 24.9 is normal.  A BMI of 25 to 29.9 is considered overweight.  A BMI  of 30 and above is considered obese.  Maintain normal blood lipids and cholesterol levels by exercising and minimizing your intake of saturated fat. Eat a balanced diet with plenty of fruit and vegetables. Blood tests for lipids and cholesterol should begin at age 58 and be repeated every 5 years. If your lipid or cholesterol levels are high, you are over 50, or you are at high risk for heart disease, you may need your cholesterol levels checked more frequently.Ongoing high lipid and cholesterol levels should be treated with medicines if diet and exercise are not working.  If you smoke, find out from your health care provider how to quit. If you do not use tobacco, do not start.  Lung cancer screening is recommended for adults aged 58-80 years who are at high risk for developing lung cancer because of a history of smoking. A yearly low-dose CT scan of the lungs is recommended for people who have at least a 30-pack-year history of smoking and are a current smoker or have quit within the past 15 years. A pack year of smoking is smoking an average of 1 pack of cigarettes a day for 1 year (for example: 1 pack a day for 30 years or 2 packs a day for 15 years). Yearly screening should continue until the smoker has stopped smoking for at least 15 years. Yearly screening should be stopped for people who develop a health problem that would prevent them from having lung cancer treatment.  High blood pressure causes heart disease and increases the risk of  stroke. Your blood pressure should be checked at least every 1 to 2 years. Ongoing high blood pressure should be treated with medicines if weight loss and exercise do not work.  If you are 55-79 years old, ask your health care provider if you should take aspirin to prevent strokes.  Diabetes screening involves taking a blood sample to check your fasting blood sugar level. This should be done once every 3 years, after age 45, if you are within normal weight and  without risk factors for diabetes. Testing should be considered at a younger age or be carried out more frequently if you are overweight and have at least 1 risk factor for diabetes.  Breast cancer screening is essential preventive care for women. You should practice "breast self-awareness." This means understanding the normal appearance and feel of your breasts and may include breast self-examination. Any changes detected, no matter how small, should be reported to a health care provider. Women in their 20s and 30s should have a clinical breast exam (CBE) by a health care provider as part of a regular health exam every 1 to 3 years. After age 40, women should have a CBE every year. Starting at age 40, women should consider having a mammogram (breast X-ray test) every year. Women who have a family history of breast cancer should talk to their health care provider about genetic screening. Women at a high risk of breast cancer should talk to their health care providers about having an MRI and a mammogram every year.  Breast cancer gene (BRCA)-related cancer risk assessment is recommended for women who have family members with BRCA-related cancers. BRCA-related cancers include breast, ovarian, tubal, and peritoneal cancers. Having family members with these cancers may be associated with an increased risk for harmful changes (mutations) in the breast cancer genes BRCA1 and BRCA2. Results of the assessment will determine the need for genetic counseling and BRCA1 and BRCA2 testing.  Routine pelvic exams to screen for cancer are no longer recommended for nonpregnant women who are considered low risk for cancer of the pelvic organs (ovaries, uterus, and vagina) and who do not have symptoms. Ask your health care provider if a screening pelvic exam is right for you.  If you have had past treatment for cervical cancer or a condition that could lead to cancer, you need Pap tests and screening for cancer for at least 20  years after your treatment. If Pap tests have been discontinued, your risk factors (such as having a new sexual partner) need to be reassessed to determine if screening should be resumed. Some women have medical problems that increase the chance of getting cervical cancer. In these cases, your health care provider may recommend more frequent screening and Pap tests.  Colorectal cancer can be detected and often prevented. Most routine colorectal cancer screening begins at the age of 50 years and continues through age 75 years. However, your health care provider may recommend screening at an earlier age if you have risk factors for colon cancer. On a yearly basis, your health care provider may provide home test kits to check for hidden blood in the stool. Use of a small camera at the end of a tube, to directly examine the colon (sigmoidoscopy or colonoscopy), can detect the earliest forms of colorectal cancer. Talk to your health care provider about this at age 50, when routine screening begins. Direct exam of the colon should be repeated every 5-10 years through age 75 years, unless early forms of pre-cancerous   polyps or small growths are found.  Hepatitis C blood testing is recommended for all people born from [birth year] through 1965 and any individual with known risks for hepatitis C.  Pra  Osteoporosis is a disease in which the bones lose minerals and strength with aging. This can result in serious bone fractures or breaks. The risk of osteoporosis can be identified using a bone density scan. Women ages 37 years and over and women at risk for fractures or osteoporosis should discuss screening with their health care providers. Ask your health care provider whether you should take a calcium supplement or vitamin D to reduce the rate of osteoporosis.  Menopause can be associated with physical symptoms and risks. Hormone replacement therapy is available to decrease symptoms and risks. You should talk to your  health care provider about whether hormone replacement therapy is right for you.  Use sunscreen. Apply sunscreen liberally and repeatedly throughout the day. You should seek shade when your shadow is shorter than you. Protect yourself by wearing long sleeves, pants, a wide-brimmed hat, and sunglasses year round, whenever you are outdoors.  Once a month, do a whole body skin exam, using a mirror to look at the skin on your back. Tell your health care provider of new moles, moles that have irregular borders, moles that are larger than a pencil eraser, or moles that have changed in shape or color.  Stay current with required vaccines (immunizations).  Influenza vaccine. All adults should be immunized every year.  Tetanus, diphtheria, and acellular pertussis (Td, Tdap) vaccine. Pregnant women should receive 1 dose of Tdap vaccine during each pregnancy. The dose should be obtained regardless of the length of time since the last dose. Immunization is preferred during the 27th-36th week of gestation. An adult who has not previously received Tdap or who does not know her vaccine status should receive 1 dose of Tdap. This initial dose should be followed by tetanus and diphtheria toxoids (Td) booster doses every 10 years. Adults with an unknown or incomplete history of completing a 3-dose immunization series with Td-containing vaccines should begin or complete a primary immunization series including a Tdap dose. Adults should receive a Td booster every 10 years.  Varicella vaccine. An adult without evidence of immunity to varicella should receive 2 doses or a second dose if she has previously received 1 dose. Pregnant females who do not have evidence of immunity should receive the first dose after pregnancy. This first dose should be obtained before leaving the health care facility. The second dose should be obtained 4-8 weeks after the first dose.  Human papillomavirus (HPV) vaccine. Females aged 13-26 years  who have not received the vaccine previously should obtain the 3-dose series. The vaccine is not recommended for use in pregnant females. However, pregnancy testing is not needed before receiving a dose. If a female is found to be pregnant after receiving a dose, no treatment is needed. In that case, the remaining doses should be delayed until after the pregnancy. Immunization is recommended for any person with an immunocompromised condition through the age of 33 years if she did not get any or all doses earlier. During the 3-dose series, the second dose should be obtained 4-8 weeks after the first dose. The third dose should be obtained 24 weeks after the first dose and 16 weeks after the second dose.  Zoster vaccine. One dose is recommended for adults aged 31 years or older unless certain conditions are present.  Measles, mumps, and rubella (  MMR) vaccine. Adults born before [birth year] generally are considered immune to measles and mumps. Adults born in [birth year] or later should have 1 or more doses of MMR vaccine unless there is a contraindication to the vaccine or there is laboratory evidence of immunity to each of the three diseases. A routine second dose of MMR vaccine should be obtained at least 28 days after the first dose for students attending postsecondary schools, health care workers, or international travelers. People who received inactivated measles vaccine or an unknown type of measles vaccine during 1963-1967 should receive 2 doses of MMR vaccine. People who received inactivated mumps vaccine or an unknown type of mumps vaccine before 1979 and are at high risk for mumps infection should consider immunization with 2 doses of MMR vaccine. For females of childbearing age, rubella immunity should be determined. If there is no evidence of immunity, females who are not pregnant should be vaccinated. If there is no evidence of immunity, females who are pregnant should delay immunization until after pregnancy.  Unvaccinated health care workers born before [birth year] who lack laboratory evidence of measles, mumps, or rubella immunity or laboratory confirmation of disease should consider measles and mumps immunization with 2 doses of MMR vaccine or rubella immunization with 1 dose of MMR vaccine.  Pneumococcal 13-valent conjugate (PCV13) vaccine. When indicated, a person who is uncertain of her immunization history and has no record of immunization should receive the PCV13 vaccine. An adult aged 39 years or older who has certain medical conditions and has not been previously immunized should receive 1 dose of PCV13 vaccine. This PCV13 should be followed with a dose of pneumococcal polysaccharide (PPSV23) vaccine. The PPSV23 vaccine dose should be obtained at least 8 weeks after the dose of PCV13 vaccine. An adult aged 62 years or older who has certain medical conditions and previously received 1 or more doses of PPSV23 vaccine should receive 1 dose of PCV13. The PCV13 vaccine dose should be obtained 1 or more years after the last PPSV23 vaccine dose.    Pneumococcal polysaccharide (PPSV23) vaccine. When PCV13 is also indicated, PCV13 should be obtained first. All adults aged 67 years and older should be immunized. An adult younger than age 45 years who has certain medical conditions should be immunized. Any person who resides in a nursing home or long-term care facility should be immunized. An adult smoker should be immunized. People with an immunocompromised condition and certain other conditions should receive both PCV13 and PPSV23 vaccines. People with human immunodeficiency virus (HIV) infection should be immunized as soon as possible after diagnosis. Immunization during chemotherapy or radiation therapy should be avoided. Routine use of PPSV23 vaccine is not recommended for American Indians, [place] Natives, or people younger than 65 years unless there are medical conditions that require PPSV23 vaccine. When indicated,  people who have unknown immunization and have no record of immunization should receive PPSV23 vaccine. One-time revaccination 5 years after the first dose of PPSV23 is recommended for people aged 19-64 years who have chronic kidney failure, nephrotic syndrome, asplenia, or immunocompromised conditions. People who received 1-2 doses of PPSV23 before age 23 years should receive another dose of PPSV23 vaccine at age 35 years or later if at least 5 years have passed since the previous dose. Doses of PPSV23 are not needed for people immunized with PPSV23 at or after age 38 years.  Preventive Services / Frequency   Ages 43 to 86 years  Blood pressure check.  Lipid and cholesterol check.  Lung  cancer screening. / Every year if you are aged 55-80 years and have a 30-pack-year history of smoking and currently smoke or have quit within the past 15 years. Yearly screening is stopped once you have quit smoking for at least 15 years or develop a health problem that would prevent you from having lung cancer treatment.  Clinical breast exam.** / Every year after age 53 years.  BRCA-related cancer risk assessment.** / For women who have family members with a BRCA-related cancer (breast, ovarian, tubal, or peritoneal cancers).  Mammogram.** / Every year beginning at age 69 years and continuing for as long as you are in good health. Consult with your health care provider.  Pap test.** / Every 3 years starting at age 37 years through age 35 or 33 years with a history of 3 consecutive normal Pap tests.  HPV screening.** / Every 3 years from ages 63 years through ages 51 to 72 years with a history of 3 consecutive normal Pap tests.  Fecal occult blood test (FOBT) of stool. / Every year beginning at age 45 years and continuing until age 14 years. You may not need to do this test if you get a colonoscopy every 10 years.  Flexible sigmoidoscopy or colonoscopy.** / Every 5 years for a flexible sigmoidoscopy or  every 10 years for a colonoscopy beginning at age 35 years and continuing until age 9 years.  Hepatitis C blood test.** / For all people born from [birth year] through 1963 and any individual with known risks for hepatitis C.  Skin self-exam. / Monthly.  Influenza vaccine. / Every year.  Tetanus, diphtheria, and acellular pertussis (Tdap/Td) vaccine.** / Consult your health care provider. Pregnant women should receive 1 dose of Tdap vaccine during each pregnancy. 1 dose of Td every 10 years.  Varicella vaccine.** / Consult your health care provider. Pregnant females who do not have evidence of immunity should receive the first dose after pregnancy.  Zoster vaccine.** / 1 dose for adults aged 25 years or older.  Pneumococcal 13-valent conjugate (PCV13) vaccine.** / Consult your health care provider.  Pneumococcal polysaccharide (PPSV23) vaccine.** / 1 to 2 doses if you smoke cigarettes or if you have certain conditions.  Meningococcal vaccine.** / Consult your health care provider.  Hepatitis A vaccine.** / Consult your health care provider.  Hepatitis B vaccine.** / Consult your health care provider. Screening for abdominal aortic aneurysm (AAA)  by ultrasound is recommended for people over 50 who have history of high blood pressure or who are current or former smokers. +++++++++++++++++++++++++++++++++++++++++  Recommend Adult Low Dose Aspirin or  coated  Aspirin 81 mg daily  To reduce risk of Colon Cancer 20 %,  Skin Cancer 26 % ,  Melanoma 46%  and  Pancreatic cancer 60% ++++++++++++++++++++++++++++++++++++++++++++++++++++++ Vitamin D goal  is between 70-100.  Please make sure that you are taking your Vitamin D as directed.  It is very important as a natural anti-inflammatory  helping hair, skin, and nails, as well as reducing stroke and heart attack risk.  It helps your bones and helps with mood. It also decreases numerous cancer risks so please take it as directed.  Low Vit D  is associated with a 200-300% higher risk for CANCER  and 200-300% higher risk for HEART   ATTACK  &  STROKE.   .....................................Marland Kitchen It is also associated with higher death rate at younger ages,  autoimmune diseases like Rheumatoid arthritis, Lupus, Multiple Sclerosis.    Also many other serious conditions, like  Alzheimer's Dementia, infertility, muscle aches, fatigue, fibromyalgia - just to name a few. ++++++++++++++++++ Recommend the book "The END of DIETING" by Dr Joel Fuhrman  & the book "The END of DIABETES " by Dr Joel Fuhrman At [employer] - get book & Audio CD's    Being diabetic has a  300% increased risk for heart attack, stroke, cancer, and alzheimer- type vascular dementia. It is very important that you work harder with diet by avoiding all foods that are white. Avoid white rice (brown & wild rice is OK), white potatoes (sweetpotatoes in moderation is OK), White bread or wheat bread or anything made out of white flour like bagels, donuts, rolls, buns, biscuits, cakes, pastries, cookies, pizza crust, and pasta (made from white flour & egg whites) - vegetarian pasta or spinach or wheat pasta is OK. Multigrain breads like Arnold's or Pepper[employer], or multigrain sandwich thins or flatbreads.  Diet, exercise and weight loss can reverse and cure diabetes in the early stages.  Diet, exercise and weight loss is very important in the control and prevention of complications of diabetes which affects every system in your body, ie. Brain - dementia/stroke, eyes - glaucoma/blindness, heart - heart attack/heart failure, kidneys - dialysis, stomach - gastric paralysis, intestines - malabsorption, nerves - severe painful neuritis, circulation - gangrene & loss of a leg(s), and finally cancer and Alzheimers.    I recommend avoid fried & greasy foods,  sweets/candy, white rice (brown or wild rice or Quinoa is OK), white potatoes (sweet potatoes are OK) - anything made from white flour - bagels, doughnuts, rolls, buns, biscuits,white  and wheat breads, pizza crust and traditional pasta made of white flour & egg white(vegetarian pasta or spinach or wheat pasta is OK).  Multi-grain bread is OK - like multi-grain flat bread or sandwich thins. Avoid alcohol in excess. Exercise is also important.    Eat all the vegetables you want - avoid meat, especially red meat and dairy - especially cheese.  Cheese is the most concentrated form of trans-fats which is the worst thing to clog up our arteries. Veggie cheese is OK which can be found in the fresh produce section at [employer] or [employer] or [employer]  ++++++++++++++++++++++ DASH Eating Plan  DASH stands for "Dietary Approaches to Stop Hypertension."   The DASH eating plan is a healthy eating plan that has been shown to reduce high blood pressure (hypertension). Additional health benefits may include reducing the risk of type 2 diabetes mellitus, heart disease, and stroke. The DASH eating plan may also help with weight loss. WHAT DO I NEED TO KNOW ABOUT THE DASH EATING PLAN? For the DASH eating plan, you will follow these general guidelines:  Choose foods with a percent daily value for sodium of less than 5% (as listed on the food label).  Use salt-free seasonings or herbs instead of table salt or sea salt.  Check with your health care provider or pharmacist before using salt substitutes.  Eat lower-sodium products, often labeled as "lower sodium" or "no salt added."  Eat fresh foods.  Eat more vegetables, fruits, and low-fat dairy products.  Choose whole grains. Look for the word "whole" as the first word in the ingredient list.  Choose fish   Limit sweets, desserts, sugars, and sugary drinks.  Choose heart-healthy fats.  Eat veggie cheese   Eat more home-cooked food and less restaurant, buffet, and fast food.  Limit fried foods.  Cook foods using methods other than frying.  Limit canned   vegetables. If you do use them, rinse them well to decrease the  sodium.  When eating at a restaurant, ask that your food be prepared with less salt, or no salt if possible.                      WHAT FOODS CAN I EAT? Read Dr Joel Fuhrman's books on The End of Dieting & The End of Diabetes  Grains Whole grain or whole wheat bread. Brown rice. Whole grain or whole wheat pasta. Quinoa, bulgur, and whole grain cereals. Low-sodium cereals. Corn or whole wheat flour tortillas. Whole grain cornbread. Whole grain crackers. Low-sodium crackers.  Vegetables Fresh or frozen vegetables (raw, steamed, roasted, or grilled). Low-sodium or reduced-sodium tomato and vegetable juices. Low-sodium or reduced-sodium tomato sauce and paste. Low-sodium or reduced-sodium canned vegetables.   Fruits All fresh, canned (in natural juice), or frozen fruits.  Protein Products  All fish and seafood.  Dried beans, peas, or lentils. Unsalted nuts and seeds. Unsalted canned beans.  Dairy Low-fat dairy products, such as skim or 1% milk, 2% or reduced-fat cheeses, low-fat ricotta or cottage cheese, or plain low-fat yogurt. Low-sodium or reduced-sodium cheeses.  Fats and Oils Tub margarines without trans fats. Light or reduced-fat mayonnaise and salad dressings (reduced sodium). Avocado. Safflower, olive, or canola oils. Natural peanut or almond butter.  Other Unsalted popcorn and pretzels. The items listed above may not be a complete list of recommended foods or beverages. Contact your dietitian for more options.  ++++++++++++++++++  WHAT FOODS ARE NOT RECOMMENDED? Grains/ White flour or wheat flour White bread. White pasta. White rice. Refined cornbread. Bagels and croissants. Crackers that contain trans fat.  Vegetables  Creamed or fried vegetables. Vegetables in a . Regular canned vegetables. Regular canned tomato sauce and paste. Regular tomato and vegetable juices.  Fruits Dried fruits. Canned fruit in light or heavy syrup. Fruit juice.  Meat and Other Protein  Products Meat in general - RED meat & White meat.  Fatty cuts of meat. Ribs, chicken wings, all processed meats as bacon, sausage, bologna, salami, fatback, hot dogs, bratwurst and packaged luncheon meats.  Dairy Whole or 2% milk, cream, half-and-half, and cream cheese. Whole-fat or sweetened yogurt. Full-fat cheeses or blue cheese. Non-dairy creamers and whipped toppings. Processed cheese, cheese spreads, or cheese curds.  Condiments Onion and garlic salt, seasoned salt, table salt, and sea salt. Canned and packaged gravies. Worcestershire sauce. Tartar sauce. Barbecue sauce. Teriyaki sauce. Soy sauce, including reduced sodium. Steak sauce. Fish sauce. Oyster sauce. Cocktail sauce. Horseradish. Ketchup and mustard. Meat flavorings and tenderizers. Bouillon cubes. Hot sauce. Tabasco sauce. Marinades. Taco seasonings. Relishes.  Fats and Oils Butter, stick margarine, lard, shortening and bacon fat. Coconut, palm kernel, or palm oils. Regular salad dressings.  Pickles and olives. Salted popcorn and pretzels.  The items listed above may not be a complete list of foods and beverages to avoid.    

## 2015-11-04 NOTE — Progress Notes (Signed)
[facility]                   Unk Pinto, M.D.    Uvaldo Bristle. Silverio Lay, P.A.-C      Starlyn Skeans, P.A.-C   [facility]                [address] [place] [id number] Telephone [phone] Telefax [phone]   Annual Screening/Preventative Visit And Comprehensive Evaluation &  Examination     This very nice 65 y.o. MWF presents for a Wellness/Preventative Visit & comprehensive evaluation and management of multiple medical co-morbidities.  Patient has been followed for HTN, Prediabetes, Hyperlipidemia and Vitamin D Deficiency.      HTN predates circa 1996. In 2004 , she had a negative Cardiolite w/EF 70%. Patient's BP has been controlled at home/. Fraser Din has hx/o pAfib followed by Dr [name] & has declined Rx anticoagulation. Patient also denies any cardiac symptoms as chest pain, palpitations, shortness of breath, dizziness or ankle swelling. Today's BP: 138/80      Patient's hyperlipidemia is controlled with diet. Last lipids were at goal. Lab Results  Component Value Date   CHOL 158 06/24/2015   HDL 46 06/24/2015   LDLCALC 80 06/24/2015   TRIG 162 (H) 06/24/2015   CHOLHDL 3.4 06/24/2015      Patient has Severe Morbid Obesity (BMI 41+) and consequent prediabetes predating circa 2012 with A1c 5.8% and patient denies reactive hypoglycemic symptoms, visual blurring, diabetic polys, or paresthesias. Last A1c was  Lab Results  Component Value Date   HGBA1C 5.7 (H) 06/24/2015      Finally, patient has history of Vitamin D Deficiency of "18" in 2008  and last Vitamin D was near goal:  Lab Results  Component Value Date   VD25OH 19 10/17/2014   Current Outpatient Prescriptions on File Prior to Visit  Medication Sig  . aspirin 81 MG tablet Take 81 mg by mouth daily.  Marland Kitchen atenolol (TENORMIN) 50 MG tablet Take 0.5 tablets (25 mg total) by mouth daily.  . celecoxib (CELEBREX) 200 MG capsule  TAKE 1 CAPSULE BY MOUTH EVERY DAY  . Cholecalciferol (VITAMIN D-3 PO) Take 8,000 Units by mouth daily.   Marland Kitchen CINNAMON PO Take 1,000 mg by mouth 2 (two) times daily.  . Flaxseed, Linseed, (FLAX SEEDS PO) Take 1,000 mg by mouth 2 (two) times daily.  . hydrochlorothiazide (HYDRODIURIL) 25 MG tablet TAKE 1 TABLET DAILY  . levocetirizine (XYZAL) 5 MG tablet TAKE 1 TABLET BY MOUTH EVERY DAY  . montelukast (SINGULAIR) 10 MG tablet TAKE 1 TABLET DAILY  . PREMPRO 0.625-2.5 MG tablet TAKE AS DIRECTED  . quinapril (ACCUPRIL) 40 MG tablet TAKE 1 TABLET DAILY   No current facility-administered medications on file prior to visit.    Allergies  Allergen Reactions  . Ciprofloxacin   . Penicillins Other (See Comments)    Had as a child=unknown reaction  . Zoloft [Sertraline Hcl]    Past Medical History:  Diagnosis Date  . Achilles tendonitis    R heal, limits activity  . Allergy   . Anxiety   . Elevated hemoglobin A1c   . Hyperlipidemia   . Hypertension   . IBS (irritable bowel syndrome)   . Insomnia   . Obesity   . Palpitations   . Vitamin D deficiency    Health Maintenance  Topic Date  Due  . TETANUS/TDAP  10/11/1971  . PAP SMEAR  10/10/1973  . MAMMOGRAM  10/11/2002  . INFLUENZA VACCINE  11/04/2015  . COLONOSCOPY  11/04/2022  . ZOSTAVAX  Completed  . Hepatitis C Screening  Completed  . HIV Screening  Completed   Immunization History  Administered Date(s) Administered  . DTaP 04/05/2006  . Influenza Split 02/19/2013  . Influenza, Seasonal, Injecte, Preservative Fre 03/12/2015  . Influenza-Unspecified 04/06/2011  . PPD Test 10/15/2013  . Pneumococcal-Unspecified 04/06/1995  . Zoster 07/04/2013   Past Surgical History:  Procedure Laterality Date  . CESAREAN SECTION    . CESAREAN SECTION    . CHOLECYSTECTOMY  10 years ago  . TUBAL LIGATION Bilateral    Family History  Problem Relation Age of Onset  . Heart attack Mother   . Stroke Mother   . Heart disease Mother   .  Diabetes Mother   . Macular degeneration Mother   . Heart attack Father   . Alcohol abuse Father   . Ovarian cancer Maternal Grandmother   . Colon cancer Neg Hx    Social History  Substance Use Topics  . Smoking status: Former Smoker    Quit date: 04/01/2004  . Smokeless tobacco: Never Used     Comment: Social smoker times 10 years  . Alcohol use 0.0 oz/week     Comment: rarely, < 1 beer per week    ROS Constitutional: Denies fever, chills, weight loss/gain, headaches, insomnia,  night sweats, and change in appetite. Does c/o fatigue. Eyes: Denies redness, blurred vision, diplopia, discharge, itchy, watery eyes.  ENT: Denies discharge, congestion, post nasal drip, epistaxis, sore throat, earache, hearing loss, dental pain, Tinnitus, Vertigo, Sinus pain, snoring.  Cardio: Denies chest pain, palpitations, irregular heartbeat, syncope, dyspnea, diaphoresis, orthopnea, PND, claudication, edema Respiratory: denies cough, dyspnea, DOE, pleurisy, hoarseness, laryngitis, wheezing.  Gastrointestinal: Denies dysphagia, heartburn, reflux, water brash, pain, cramps, nausea, vomiting, bloating, diarrhea, constipation, hematemesis, melena, hematochezia, jaundice, hemorrhoids Genitourinary: Denies dysuria, frequency, urgency, nocturia, hesitancy, discharge, hematuria, flank pain Breast: Breast lumps, nipple discharge, bleeding.  Musculoskeletal: Denies arthralgia, myalgia, stiffness, Jt. Swelling, pain, limp, and strain/sprain. Denies falls. Skin: Denies puritis, rash, hives, warts, acne, eczema, changing in skin lesion Neuro: No weakness, tremor, incoordination, spasms, paresthesia, pain Psychiatric: Denies confusion, memory loss, sensory loss. Denies Depression. Endocrine: Denies change in weight, skin, hair change, nocturia, and paresthesia, diabetic polys, visual blurring, hyper / hypo glycemic episodes.  Heme/Lymph: No excessive bleeding, bruising, enlarged lymph nodes.  Physical Exam  BP  138/80   Pulse (!) 54   Temp 97.7 F (36.5 C)   Ht 5' 7.5" (1.715 m)   Wt 266 lb 9.6 oz (120.9 kg)   BMI 41.14 kg/m   General Appearance: Well nourished and in no apparent distress. Eyes: PERRLA, EOMs, conjunctiva no swelling or erythema, normal fundi and vessels. Sinuses: No frontal/maxillary tenderness ENT/Mouth: EACs patent / TMs  nl. Nares clear without erythema, swelling, mucoid exudates. Oral hygiene is good. No erythema, swelling, or exudate. Tongue normal, non-obstructing. Tonsils not swollen or erythematous. Hearing normal.  Neck: Supple, thyroid normal. No bruits, nodes or JVD. Respiratory: Respiratory effort normal.  BS equal and clear bilateral without rales, rhonci, wheezing or stridor. Cardio: Heart sounds are normal with regular rate and rhythm and no murmurs, rubs or gallops. Peripheral pulses are normal and equal bilaterally without edema. No aortic or femoral bruits. Chest: symmetric with normal excursions and percussion. Breasts: Symmetric, without lumps, nipple discharge, retractions, or fibrocystic changes.  Abdomen: Rotund & soft with bowel sounds active. Nontender, no guarding, rebound, hernias, masses, or organomegaly.  Lymphatics: Non tender without lymphadenopathy.  Musculoskeletal: Full ROM all peripheral extremities, joint stability, 5/5 strength, and normal gait. Skin: Warm and dry without rashes, lesions, cyanosis, clubbing or  ecchymosis.  Neuro: Cranial nerves intact, reflexes equal bilaterally. Normal muscle tone, no cerebellar symptoms. Sensation intact.  Pysch: Alert and oriented X 3, normal affect, Insight and Judgment appropriate.   Assessment and Plan  1. Annual Preventative Screening Examination  - Microalbumin / creatinine urine ratio - EKG 12-Lead - [place], RETROPERITNL ABD,  LTD - POC Hemoccult Bld/Stl - Urinalysis, Routine w reflex microscopic  - Vitamin B12 - Iron and TIBC - CBC with Differential/Platelet - BASIC METABOLIC PANEL WITH GFR -  Hepatic function panel - Magnesium - Lipid panel - TSH - Hemoglobin A1c - Insulin, random - VITAMIN D 25 Hydroxy   2. Essential hypertension  - Microalbumin / creatinine urine ratio - EKG 12-Lead - [place], RETROPERITNL ABD,  LTD - TSH  3. Hyperlipidemia  - EKG 12-Lead - [place], RETROPERITNL ABD,  LTD - Lipid panel - TSH  4. Vitamin D deficiency  - VITAMIN D 25 Hydroxy   5. Prediabetes  - EKG 12-Lead - [place], RETROPERITNL ABD,  LTD - Hemoglobin A1c - Insulin, random  6. Encounter for screening for malignant neoplasm of rectum  - POC Hemoccult Bld/Stl   7. Screening for ischemic heart disease  - EKG 12-Lead  8. Screening for AAA (aortic abdominal aneurysm)  - [place], RETROPERITNL ABD,  LTD  9. Other fatigue  - Vitamin B12 - Iron and TIBC - CBC with Differential/Platelet - TSH  10. Medication management  - Urinalysis, Routine w reflex microscopic  - CBC with Differential/Platelet - BASIC METABOLIC PANEL WITH GFR - Hepatic function panel - Magnesium  11. Paroxysmal atrial fibrillation (HCC)     Continue prudent diet as discussed, weight control, BP monitoring, regular exercise, and medications. Discussed med's effects and SE's. Screening labs and tests as requested with regular follow-up as recommended. Over 40 minutes of exam, counseling, chart review and high complex critical decision making was performed.

## 2015-11-05 LAB — CBC WITH DIFFERENTIAL/PLATELET
BASOS PCT: 1 %
Basophils Absolute: 74 cells/uL (ref 0–200)
EOS PCT: 3 %
Eosinophils Absolute: 222 cells/uL (ref 15–500)
HCT: 40.7 % (ref 35.0–45.0)
HEMOGLOBIN: 13.3 g/dL (ref 11.7–15.5)
LYMPHS ABS: 2072 {cells}/uL (ref 850–3900)
Lymphocytes Relative: 28 %
MCH: 28.7 pg (ref 27.0–33.0)
MCHC: 32.7 g/dL (ref 32.0–36.0)
MCV: 87.7 fL (ref 80.0–100.0)
MONOS PCT: 11 %
MPV: 10.3 fL (ref 7.5–12.5)
Monocytes Absolute: 814 cells/uL (ref 200–950)
NEUTROS ABS: 4218 {cells}/uL (ref 1500–7800)
Neutrophils Relative %: 57 %
PLATELETS: 307 10*3/uL (ref 140–400)
RBC: 4.64 MIL/uL (ref 3.80–5.10)
RDW: 13.8 % (ref 11.0–15.0)
WBC: 7.4 10*3/uL (ref 3.8–10.8)

## 2015-11-05 LAB — HEPATIC FUNCTION PANEL
ALK PHOS: 61 U/L (ref 33–130)
ALT: 14 U/L (ref 6–29)
AST: 18 U/L (ref 10–35)
Albumin: 3.7 g/dL (ref 3.6–5.1)
BILIRUBIN DIRECT: 0.1 mg/dL (ref <=0.2)
BILIRUBIN INDIRECT: 0.6 mg/dL (ref 0.2–1.2)
BILIRUBIN TOTAL: 0.7 mg/dL (ref 0.2–1.2)
Total Protein: 6.7 g/dL (ref 6.1–8.1)

## 2015-11-05 LAB — VITAMIN D 25 HYDROXY (VIT D DEFICIENCY, FRACTURES): VIT D 25 HYDROXY: 63 ng/mL (ref 30–100)

## 2015-11-05 LAB — URINALYSIS, ROUTINE W REFLEX MICROSCOPIC
BILIRUBIN URINE: NEGATIVE
GLUCOSE, UA: NEGATIVE
Hgb urine dipstick: NEGATIVE
Ketones, ur: NEGATIVE
Leukocytes, UA: NEGATIVE
Nitrite: NEGATIVE
PROTEIN: NEGATIVE
SPECIFIC GRAVITY, URINE: 1.012 (ref 1.001–1.035)
pH: 6.5 (ref 5.0–8.0)

## 2015-11-05 LAB — IRON AND TIBC
%SAT: 26 % (ref 11–50)
Iron: 88 ug/dL (ref 45–160)
TIBC: 343 ug/dL (ref 250–450)
UIBC: 255 ug/dL (ref 125–400)

## 2015-11-05 LAB — BASIC METABOLIC PANEL WITH GFR
BUN: 15 mg/dL (ref 7–25)
CHLORIDE: 102 mmol/L (ref 98–110)
CO2: 23 mmol/L (ref 20–31)
CREATININE: 0.88 mg/dL (ref 0.50–0.99)
Calcium: 9.3 mg/dL (ref 8.6–10.4)
GFR, EST NON AFRICAN AMERICAN: 70 mL/min (ref >=60)
GFR, Est African American: 81 mL/min (ref >=60)
Glucose, Bld: 92 mg/dL (ref 65–99)
POTASSIUM: 4.1 mmol/L (ref 3.5–5.3)
SODIUM: 137 mmol/L (ref 135–146)

## 2015-11-05 LAB — HEMOGLOBIN A1C
HEMOGLOBIN A1C: 5.8 % — AB (ref <5.7)
MEAN PLASMA GLUCOSE: 120 mg/dL

## 2015-11-05 LAB — LIPID PANEL
CHOL/HDL RATIO: 3.2 ratio (ref <=5.0)
Cholesterol: 165 mg/dL (ref 125–200)
HDL: 52 mg/dL (ref >=46)
LDL Cholesterol: 74 mg/dL (ref <130)
Triglycerides: 193 mg/dL — ABNORMAL HIGH (ref <150)
VLDL: 39 mg/dL — AB (ref <30)

## 2015-11-05 LAB — MICROALBUMIN / CREATININE URINE RATIO
CREATININE, URINE: 102 mg/dL (ref 20–320)
MICROALB/CREAT RATIO: 3 ug/mg{creat} (ref <30)
Microalb, Ur: 0.3 mg/dL

## 2015-11-05 LAB — MAGNESIUM: MAGNESIUM: 1.9 mg/dL (ref 1.5–2.5)

## 2015-11-05 LAB — INSULIN, RANDOM: Insulin: 8 u[IU]/mL (ref 2.0–19.6)

## 2015-11-05 LAB — VITAMIN B12: Vitamin B-12: 408 pg/mL (ref 200–1100)

## 2015-11-05 LAB — TSH: TSH: 2.88 m[IU]/L

## 2015-12-06 ENCOUNTER — Other Ambulatory Visit: Payer: Self-pay | Admitting: Physician Assistant

## 2016-01-30 ENCOUNTER — Telehealth: Payer: Self-pay | Admitting: Cardiovascular Disease

## 2016-01-30 NOTE — Telephone Encounter (Signed)
Patient stated she has been having some racing heart beats after having her celebrex medication changed to Turmeric by her PCP. Patient did not have a BP or HR to report. Patient wanted to know if she could go back to taking Atenolol 50 mg (1 tablet) instead of 25 mg (1/2 tablet). Patient's Atenolol was decreased due to bradycardia. Informed patient that if her symptoms started with the change in her celebrex, then she should consult the doctor that made the changes. Patient stated she would call her PCP and give [place] a call back if she had any other questions.

## 2016-01-30 NOTE — Telephone Encounter (Signed)
°  New Prob   Pt has some question regarding her Atenolol medication. Please call.

## 2016-02-04 NOTE — Progress Notes (Deleted)
CARDIOLOGY OFFICE NOTE  Date:  02/04/2016    [name] Date of Birth: Sep 23, 1950 Medical Record [id number]  PCP:  Alesia Richards, MD  Cardiologist:  Servando Snare & ***    No chief complaint on file.   History of Present Illness: Claudia [name] is a 65 y.o. female who presents today for a ***   Comes in today. Here with   Past Medical History:  Diagnosis Date  . Achilles tendonitis    R heal, limits activity  . Allergy   . Anxiety   . Elevated hemoglobin A1c   . Hyperlipidemia   . Hypertension   . IBS (irritable bowel syndrome)   . Insomnia   . Obesity   . Palpitations   . Vitamin D deficiency     Past Surgical History:  Procedure Laterality Date  . CESAREAN SECTION    . CESAREAN SECTION    . CHOLECYSTECTOMY  10 years ago  . TUBAL LIGATION Bilateral      Medications: Current Outpatient Prescriptions  Medication Sig Dispense Refill  . aspirin 81 MG tablet Take 81 mg by mouth daily.    Marland Kitchen atenolol (TENORMIN) 50 MG tablet Take 0.5 tablets (25 mg total) by mouth daily. 45 tablet 3  . Cholecalciferol (VITAMIN D-3 PO) Take 8,000 Units by mouth daily.     Marland Kitchen CINNAMON PO Take 1,000 mg by mouth 2 (two) times daily.    . Flaxseed, Linseed, (FLAX SEEDS PO) Take 1,000 mg by mouth 2 (two) times daily.    . hydrochlorothiazide (HYDRODIURIL) 25 MG tablet TAKE 1 TABLET DAILY 90 tablet 1  . levocetirizine (XYZAL) 5 MG tablet TAKE 1 TABLET BY MOUTH EVERY DAY 90 tablet 3  . montelukast (SINGULAIR) 10 MG tablet TAKE 1 TABLET DAILY 90 tablet 3  . PREMPRO 0.625-2.5 MG tablet TAKE AS DIRECTED 84 tablet 99  . quinapril (ACCUPRIL) 40 MG tablet TAKE 1 TABLET DAILY 90 tablet 2  . TURMERIC PO Take 1 tablet by mouth daily.     No current facility-administered medications for this visit.     Allergies: Allergies  Allergen Reactions  . Ciprofloxacin   . Penicillins Other (See Comments)    Had as a child=unknown reaction  . Zoloft [Sertraline Hcl]     Social  History: The patient  reports that she quit smoking about 11 years ago. She has never used smokeless tobacco. She reports that she drinks alcohol. She reports that she does not use drugs.   Family History: The patient's ***family history includes Alcohol abuse in her father; Diabetes in her mother; Heart attack in her father and mother; Heart disease in her mother; Macular degeneration in her mother; Ovarian cancer in her maternal grandmother; Stroke in her mother.   Review of Systems: Please see the history of present illness.   Otherwise, the review of systems is positive for {NONE DEFAULTED:18576::"none"}.   All other systems are reviewed and negative.   Physical Exam: VS:  There were no vitals taken for this visit. Marland Kitchen  BMI There is no height or weight on file to calculate BMI.  Wt Readings from Last 3 Encounters:  11/04/15 266 lb 9.6 oz (120.9 kg)  09/11/15 265 lb 12.8 oz (120.6 kg)  06/24/15 262 lb 9.6 oz (119.1 kg)    General: Pleasant. Well developed, well nourished and in no acute distress.   HEENT: Normal.  Neck: Supple, no JVD, carotid bruits, or masses noted.  Cardiac: ***Regular rate and rhythm. No murmurs, rubs,  or gallops. No edema.  Respiratory:  Lungs are clear to auscultation bilaterally with normal work of breathing.  GI: Soft and nontender.  MS: No deformity or atrophy. Gait and ROM intact.  Skin: Warm and dry. Color is normal.  Neuro:  Strength and sensation are intact and no gross focal deficits noted.  Psych: Alert, appropriate and with normal affect.   LABORATORY DATA:  EKG:  EKG {ACTION; IS/IS GI:087931 ordered today. This demonstrates ***.  Lab Results  Component Value Date   WBC 7.4 11/04/2015   HGB 13.3 11/04/2015   HCT 40.7 11/04/2015   PLT 307 11/04/2015   GLUCOSE 92 11/04/2015   CHOL 165 11/04/2015   TRIG 193 (H) 11/04/2015   HDL 52 11/04/2015   LDLCALC 74 11/04/2015   ALT 14 11/04/2015   AST 18 11/04/2015   NA 137 11/04/2015   K 4.1  11/04/2015   CL 102 11/04/2015   CREATININE 0.88 11/04/2015   BUN 15 11/04/2015   CO2 23 11/04/2015   TSH 2.88 11/04/2015   HGBA1C 5.8 (H) 11/04/2015   MICROALBUR 0.3 11/04/2015    BNP (last 3 results) No results for input(s): BNP in the last 8760 hours.  ProBNP (last 3 results) No results for input(s): PROBNP in the last 8760 hours.   Other Studies Reviewed Today:   Assessment/Plan:   Current medicines are reviewed with the patient today.  The patient does not have concerns regarding medicines other than what has been noted above.  The following changes have been made:  See above.  Labs/ tests ordered today include:   No orders of the defined types were placed in this encounter.    Disposition:   FU with *** in {gen number [id number] {Days to years:10300}.   Patient is agreeable to this plan and will call if any problems develop in the interim.   Signed: Burtis Junes, RN, ANP-C 02/04/2016 12:48 PM  [facility] [address] [place], [place]  [address] Phone: [phone] Fax: [phone]

## 2016-02-09 ENCOUNTER — Ambulatory Visit: Admitting: Nurse Practitioner

## 2016-02-09 NOTE — Progress Notes (Deleted)
CARDIOLOGY OFFICE NOTE  Date:  02/09/2016    [name] Date of Birth: [birth year] Medical Record [id number]  PCP:  Alesia Richards, MD  Cardiologist:  [name]   No chief complaint on file.   History of Present Illness: Claudia [name] is a 65 y.o. female who presents today for a follow up visit. Seen for Dr. [name].   She has a history of HTN, obesity, and palpitations.  She wore an event monitor this last spring.  This documented frequent pacs as well as rare nonsustained atrial tachycardia.  She also has had short episodes of rate controlled afib.  There was one regular and prolonged SVT which is likely atrial flutter.  She was placed on beta blocker therapy - the dose was reduced due to dizziness.  She has declined anticoagulation. Has to take Celebrex daily for arthritis pain and she feels her quality of life will suffer if she has to stop this drug.  Probably has sleep apnea but not inclined to have testing or wear CPAP.   Phone call last week - "Patient stated she has been having some racing heart beats after having her celebrex medication changed to Turmeric by her PCP. Patient did not have a BP or HR to report. Patient wanted to know if she could go back to taking Atenolol 50 mg (1 tablet) instead of 25 mg (1/2 tablet). Patient's Atenolol was decreased due to bradycardia. Informed patient that if her symptoms started with the change in her celebrex, then she should consult the doctor that made the changes. Patient stated she would call her PCP and give [place] a call back if she had any other questions".  But added to my schedule for today.   Comes in today. Here with   Past Medical History:  Diagnosis Date  . Achilles tendonitis    R heal, limits activity  . Allergy   . Anxiety   . Elevated hemoglobin A1c   . Hyperlipidemia   . Hypertension   . IBS (irritable bowel syndrome)   . Insomnia   . Obesity   . Palpitations   . Vitamin D deficiency     Past  Surgical History:  Procedure Laterality Date  . CESAREAN SECTION    . CESAREAN SECTION    . CHOLECYSTECTOMY  10 years ago  . TUBAL LIGATION Bilateral      Medications: Current Outpatient Prescriptions  Medication Sig Dispense Refill  . aspirin 81 MG tablet Take 81 mg by mouth daily.    Marland Kitchen atenolol (TENORMIN) 50 MG tablet Take 0.5 tablets (25 mg total) by mouth daily. 45 tablet 3  . Cholecalciferol (VITAMIN D-3 PO) Take 8,000 Units by mouth daily.     Marland Kitchen CINNAMON PO Take 1,000 mg by mouth 2 (two) times daily.    . Flaxseed, Linseed, (FLAX SEEDS PO) Take 1,000 mg by mouth 2 (two) times daily.    . hydrochlorothiazide (HYDRODIURIL) 25 MG tablet TAKE 1 TABLET DAILY 90 tablet 1  . levocetirizine (XYZAL) 5 MG tablet TAKE 1 TABLET BY MOUTH EVERY DAY 90 tablet 3  . montelukast (SINGULAIR) 10 MG tablet TAKE 1 TABLET DAILY 90 tablet 3  . PREMPRO 0.625-2.5 MG tablet TAKE AS DIRECTED 84 tablet 99  . quinapril (ACCUPRIL) 40 MG tablet TAKE 1 TABLET DAILY 90 tablet 2  . TURMERIC PO Take 1 tablet by mouth daily.     No current facility-administered medications for this visit.     Allergies: Allergies  Allergen Reactions  .  Ciprofloxacin   . Penicillins Other (See Comments)    Had as a child=unknown reaction  . Zoloft [Sertraline Hcl]     Social History: The patient  reports that she quit smoking about 11 years ago. She has never used smokeless tobacco. She reports that she drinks alcohol. She reports that she does not use drugs.   Family History: The patient's family history includes Alcohol abuse in her father; Diabetes in her mother; Heart attack in her father and mother; Heart disease in her mother; Macular degeneration in her mother; Ovarian cancer in her maternal grandmother; Stroke in her mother.   Review of Systems: Please see the history of present illness.   Otherwise, the review of systems is positive for none.   All other systems are reviewed and negative.   Physical Exam: VS:   There were no vitals taken for this visit. Marland Kitchen  BMI There is no height or weight on file to calculate BMI.  Wt Readings from Last 3 Encounters:  11/04/15 266 lb 9.6 oz (120.9 kg)  09/11/15 265 lb 12.8 oz (120.6 kg)  06/24/15 262 lb 9.6 oz (119.1 kg)    General: Pleasant. Well developed, well nourished and in no acute distress.   HEENT: Normal.  Neck: Supple, no JVD, carotid bruits, or masses noted.  Cardiac: Regular rate and rhythm. No murmurs, rubs, or gallops. No edema.  Respiratory:  Lungs are clear to auscultation bilaterally with normal work of breathing.  GI: Soft and nontender.  MS: No deformity or atrophy. Gait and ROM intact.  Skin: Warm and dry. Color is normal.  Neuro:  Strength and sensation are intact and no gross focal deficits noted.  Psych: Alert, appropriate and with normal affect.   LABORATORY DATA:  EKG:  EKG is ordered today. This demonstrates .  Lab Results  Component Value Date   WBC 7.4 11/04/2015   HGB 13.3 11/04/2015   HCT 40.7 11/04/2015   PLT 307 11/04/2015   GLUCOSE 92 11/04/2015   CHOL 165 11/04/2015   TRIG 193 (H) 11/04/2015   HDL 52 11/04/2015   LDLCALC 74 11/04/2015   ALT 14 11/04/2015   AST 18 11/04/2015   NA 137 11/04/2015   K 4.1 11/04/2015   CL 102 11/04/2015   CREATININE 0.88 11/04/2015   BUN 15 11/04/2015   CO2 23 11/04/2015   TSH 2.88 11/04/2015   HGBA1C 5.8 (H) 11/04/2015   MICROALBUR 0.3 11/04/2015    BNP (last 3 results) No results for input(s): BNP in the last 8760 hours.  ProBNP (last 3 results) No results for input(s): PROBNP in the last 8760 hours.   Other Studies Reviewed Today:   Assessment/Plan:   Current medicines are reviewed with the patient today.  The patient does not have concerns regarding medicines other than what has been noted above.  The following changes have been made:  See above.  Labs/ tests ordered today include:   No orders of the defined types were placed in this  encounter.    Disposition:   FU with  Patient is agreeable to this plan and will call if any problems develop in the interim.   Signed: Burtis Junes, RN, ANP-C 02/09/2016 7:18 AM  [facility] [address] [place], [place]  [address] Phone: [phone] Fax: [phone]

## 2016-02-11 ENCOUNTER — Other Ambulatory Visit: Payer: Self-pay | Admitting: *Deleted

## 2016-02-11 MED ORDER — QUINAPRIL HCL 40 MG PO TABS: 40.0000 mg | ORAL_TABLET | Freq: Every day | ORAL | 2 refills | Status: DC

## 2016-02-11 MED ORDER — QUINAPRIL HCL 40 MG PO TABS: 40.0000 mg | ORAL_TABLET | Freq: Every day | ORAL | 0 refills | Status: DC

## 2016-02-18 ENCOUNTER — Ambulatory Visit: Admitting: Nurse Practitioner

## 2016-02-18 NOTE — Progress Notes (Deleted)
CARDIOLOGY OFFICE NOTE  Date:  02/18/2016    Claudia [name] Date of Birth: 10/28/1950 Medical Record [id number]  PCP:  Alesia Richards, MD  Cardiologist:  Servando Snare & ***    No chief complaint on file.   History of Present Illness: Claudia [name] is a 64 y.o. female who presents today for a ***   Comes in today. Here with   Past Medical History:  Diagnosis Date  . Achilles tendonitis    R heal, limits activity  . Allergy   . Anxiety   . Elevated hemoglobin A1c   . Hyperlipidemia   . Hypertension   . IBS (irritable bowel syndrome)   . Insomnia   . Obesity   . Palpitations   . Vitamin D deficiency     Past Surgical History:  Procedure Laterality Date  . CESAREAN SECTION    . CESAREAN SECTION    . CHOLECYSTECTOMY  10 years ago  . TUBAL LIGATION Bilateral      Medications: Current Outpatient Prescriptions  Medication Sig Dispense Refill  . aspirin 81 MG tablet Take 81 mg by mouth daily.    Marland Kitchen atenolol (TENORMIN) 50 MG tablet Take 0.5 tablets (25 mg total) by mouth daily. 45 tablet 3  . Cholecalciferol (VITAMIN D-3 PO) Take 8,000 Units by mouth daily.     Marland Kitchen CINNAMON PO Take 1,000 mg by mouth 2 (two) times daily.    . Flaxseed, Linseed, (FLAX SEEDS PO) Take 1,000 mg by mouth 2 (two) times daily.    . hydrochlorothiazide (HYDRODIURIL) 25 MG tablet TAKE 1 TABLET DAILY 90 tablet 1  . levocetirizine (XYZAL) 5 MG tablet TAKE 1 TABLET BY MOUTH EVERY DAY 90 tablet 3  . montelukast (SINGULAIR) 10 MG tablet TAKE 1 TABLET DAILY 90 tablet 3  . PREMPRO 0.625-2.5 MG tablet TAKE AS DIRECTED 84 tablet 99  . quinapril (ACCUPRIL) 40 MG tablet Take 1 tablet (40 mg total) by mouth daily. 90 tablet 2  . TURMERIC PO Take 1 tablet by mouth daily.     No current facility-administered medications for this visit.     Allergies: Allergies  Allergen Reactions  . Ciprofloxacin   . Penicillins Other (See Comments)    Had as a child=unknown reaction  . Zoloft  [Sertraline Hcl]     Social History: The patient  reports that she quit smoking about 11 years ago. She has never used smokeless tobacco. She reports that she drinks alcohol. She reports that she does not use drugs.   Family History: The patient's ***family history includes Alcohol abuse in her father; Diabetes in her mother; Heart attack in her father and mother; Heart disease in her mother; Macular degeneration in her mother; Ovarian cancer in her maternal grandmother; Stroke in her mother.   Review of Systems: Please see the history of present illness.   Otherwise, the review of systems is positive for {NONE DEFAULTED:18576::"none"}.   All other systems are reviewed and negative.   Physical Exam: VS:  There were no vitals taken for this visit. Marland Kitchen  BMI There is no height or weight on file to calculate BMI.  Wt Readings from Last 3 Encounters:  11/04/15 266 lb 9.6 oz (120.9 kg)  09/11/15 265 lb 12.8 oz (120.6 kg)  06/24/15 262 lb 9.6 oz (119.1 kg)    General: Pleasant. Well developed, well nourished and in no acute distress.   HEENT: Normal.  Neck: Supple, no JVD, carotid bruits, or masses noted.  Cardiac: ***Regular rate  and rhythm. No murmurs, rubs, or gallops. No edema.  Respiratory:  Lungs are clear to auscultation bilaterally with normal work of breathing.  GI: Soft and nontender.  MS: No deformity or atrophy. Gait and ROM intact.  Skin: Warm and dry. Color is normal.  Neuro:  Strength and sensation are intact and no gross focal deficits noted.  Psych: Alert, appropriate and with normal affect.   LABORATORY DATA:  EKG:  EKG {ACTION; IS/IS GI:087931 ordered today. This demonstrates ***.  Lab Results  Component Value Date   WBC 7.4 11/04/2015   HGB 13.3 11/04/2015   HCT 40.7 11/04/2015   PLT 307 11/04/2015   GLUCOSE 92 11/04/2015   CHOL 165 11/04/2015   TRIG 193 (H) 11/04/2015   HDL 52 11/04/2015   LDLCALC 74 11/04/2015   ALT 14 11/04/2015   AST 18 11/04/2015     NA 137 11/04/2015   K 4.1 11/04/2015   CL 102 11/04/2015   CREATININE 0.88 11/04/2015   BUN 15 11/04/2015   CO2 23 11/04/2015   TSH 2.88 11/04/2015   HGBA1C 5.8 (H) 11/04/2015   MICROALBUR 0.3 11/04/2015    BNP (last 3 results) No results for input(s): BNP in the last 8760 hours.  ProBNP (last 3 results) No results for input(s): PROBNP in the last 8760 hours.   Other Studies Reviewed Today:   Assessment/Plan:   Current medicines are reviewed with the patient today.  The patient does not have concerns regarding medicines other than what has been noted above.  The following changes have been made:  See above.  Labs/ tests ordered today include:   No orders of the defined types were placed in this encounter.    Disposition:   FU with *** in {gen number [id number] {Days to years:10300}.   Patient is agreeable to this plan and will call if any problems develop in the interim.   Signed: Burtis Junes, RN, ANP-C 02/18/2016 7:47 AM  [facility] [address] [place] [employer], [place]  [address] Phone: [phone] Fax: [phone]

## 2016-03-02 ENCOUNTER — Ambulatory Visit (INDEPENDENT_AMBULATORY_CARE_PROVIDER_SITE_OTHER): Admitting: Nurse Practitioner

## 2016-03-02 ENCOUNTER — Encounter: Payer: Self-pay | Admitting: Nurse Practitioner

## 2016-03-02 ENCOUNTER — Encounter (INDEPENDENT_AMBULATORY_CARE_PROVIDER_SITE_OTHER): Payer: Self-pay

## 2016-03-02 VITALS — BP 118/78 | HR 63 | Ht 68.0 in | Wt 263.8 lb

## 2016-03-02 DIAGNOSIS — I10 Essential (primary) hypertension: Secondary | ICD-10-CM | POA: Diagnosis not present

## 2016-03-02 DIAGNOSIS — R002 Palpitations: Secondary | ICD-10-CM

## 2016-03-02 DIAGNOSIS — Z7901 Long term (current) use of anticoagulants: Secondary | ICD-10-CM | POA: Diagnosis not present

## 2016-03-02 DIAGNOSIS — I7789 Other specified disorders of arteries and arterioles: Secondary | ICD-10-CM

## 2016-03-02 DIAGNOSIS — I480 Paroxysmal atrial fibrillation: Secondary | ICD-10-CM

## 2016-03-02 MED ORDER — APIXABAN 2.5 MG PO TABS: 5.0000 mg | ORAL_TABLET | Freq: Two times a day (BID) | ORAL | Status: DC

## 2016-03-02 NOTE — Patient Instructions (Addendum)
We will be checking the following labs today - NONE  BMET and CBC in one month to follow up your Eliquis   Medication Instructions:    Continue with your current medicines. BUT  STOP aspirin after tomorrow AM dose  START Eliquis 5 mg tonight - then one pill two times a day - I have sent this to your mail order.     Testing/Procedures To Be Arranged:  CT angio of the chest - ok to do in one month - follow up dilated aorta  Follow-Up:   See me in 3 months  Lab in 4 weeks  Dr. [name] as planned in June of 2018    Other Special Instructions:   N/A    If you need a refill on your cardiac medications before your next appointment, please call your pharmacy.   Call the [place] office at [phone] if you have any questions, problems or concerns.

## 2016-03-02 NOTE — Progress Notes (Signed)
CARDIOLOGY OFFICE NOTE  Date:  03/02/2016    Claudia [name] Date of Birth: January 12, 1951 Medical Record [id number]  PCP:  Alesia Richards, MD  Cardiologist:  [name]    Chief Complaint  Patient presents with  . Palpitations    Work in visit - seen for Dr. [name]    History of Present Illness: Claudia [name] is a 65 y.o. female who presents today for a work in visit. Seen for Dr. [name].   She has a h/o HTN, obesity, PAF and palpitations.  Who presents today for f/u palpitations She wore an event monitor back in 2015 which was noted to have documented frequent pacs as well as rare nonsustained atrial tachycardia.  She also has had short episodes of rate controlled afib.  There was one regular and prolonged SVT which was likely atrial flutter.  She has had good response to atenolol to manage palpitations. She has been noted to have declined further treatment with procedures/AAD/blood thinners despite a chadsvasc score of at least two.  She takes Celebrex daily for arthritis pain and she feels her quality of life will suffer if she has to stop this drug. Risks factors for her include obesity and snoring with probable sleep apnea - she has declined sleep study and finds the thought of wearing CPAP not appealing to her. She has been seen in the AF clinic in the past.   She was last seen back in June by Dr. [name] - seemed to be doing ok. She has had her dose of beta blocker cut back due to tachycardia.   Phone call back from October -  "Patient stated she has been having some racing heart beats after having her celebrex medication changed to Turmeric by her PCP. Patient did not have a BP or HR to report. Patient wanted to know if she could go back to taking Atenolol 50 mg (1 tablet) instead of 25 mg (1/2 tablet). Patient's Atenolol was decreased due to bradycardia. Informed patient that if her symptoms started with the change in her celebrex, then she should consult the doctor that  made the changes. Patient stated she would call her PCP and give [place] a call back if she had any other questions."  Comes in today. Here alone. She says she has "cured herself" since her phone call last month. She was placed on Turmeric in place of the Celebrex by PCP. She felt like she was having more palpitations and that it was related to the dose of Turmeric. She cut her dose back of the Turmeric and now seems to be better. She still has palpitations - says almost every day. Not dizzy or lightheaded. No passing out. No chest pain. Not short of breath. Borderline DM.   Past Medical History:  Diagnosis Date  . Achilles tendonitis    R heal, limits activity  . Allergy   . Anxiety   . Elevated hemoglobin A1c   . Hyperlipidemia   . Hypertension   . IBS (irritable bowel syndrome)   . Insomnia   . Obesity   . Palpitations   . Vitamin D deficiency     Past Surgical History:  Procedure Laterality Date  . CESAREAN SECTION    . CESAREAN SECTION    . CHOLECYSTECTOMY  10 years ago  . TUBAL LIGATION Bilateral      Medications: Current Outpatient Prescriptions  Medication Sig Dispense Refill  . atenolol (TENORMIN) 50 MG tablet Take 0.5 tablets (25 mg total) by mouth  daily. 45 tablet 3  . Cholecalciferol (VITAMIN D-3 PO) Take 8,000 Units by mouth daily.     Marland Kitchen CINNAMON PO Take 1,000 mg by mouth 2 (two) times daily.    . Flaxseed, Linseed, (FLAX SEEDS PO) Take 1,000 mg by mouth 2 (two) times daily.    . hydrochlorothiazide (HYDRODIURIL) 25 MG tablet TAKE 1 TABLET DAILY 90 tablet 1  . levocetirizine (XYZAL) 5 MG tablet TAKE 1 TABLET BY MOUTH EVERY DAY 90 tablet 3  . montelukast (SINGULAIR) 10 MG tablet TAKE 1 TABLET DAILY 90 tablet 3  . PREMPRO 0.625-2.5 MG tablet TAKE AS DIRECTED 84 tablet 99  . quinapril (ACCUPRIL) 40 MG tablet Take 1 tablet (40 mg total) by mouth daily. 90 tablet 2  . TURMERIC PO Take 500 mg by mouth 2 (two) times daily.      Current Facility-Administered Medications    Medication Dose Route Frequency Provider Last Rate Last Dose  . apixaban (ELIQUIS) tablet 5 mg  5 mg Oral BID Burtis Junes, NP        Allergies: Allergies  Allergen Reactions  . Ciprofloxacin   . Penicillins Other (See Comments)    Had as a child=unknown reaction  . Zoloft [Sertraline Hcl]     Social History: The patient  reports that she quit smoking about 11 years ago. She has never used smokeless tobacco. She reports that she drinks alcohol. She reports that she does not use drugs.   Family History: The patient's family history includes Alcohol abuse in her father; Diabetes in her mother; Heart attack in her father and mother; Heart disease in her mother; Macular degeneration in her mother; Ovarian cancer in her maternal grandmother; Stroke in her mother.   Review of Systems: Please see the history of present illness.   Otherwise, the review of systems is positive for none.   All other systems are reviewed and negative.   Physical Exam: VS:  BP 118/78   Pulse 63   Ht 5\' 8"  (1.727 m)   Wt 263 lb 12.8 oz (119.7 kg)   BMI 40.11 kg/m  .  BMI Body mass index is 40.11 kg/m.  Wt Readings from Last 3 Encounters:  03/02/16 263 lb 12.8 oz (119.7 kg)  11/04/15 266 lb 9.6 oz (120.9 kg)  09/11/15 265 lb 12.8 oz (120.6 kg)    General: Pleasant. Obese female who is alert and in no acute distress.   HEENT: Normal.  Neck: Supple, no JVD, carotid bruits, or masses noted.  Cardiac: Regular rate and rhythm. No murmurs, rubs, or gallops. No edema.  Respiratory:  Lungs are clear to auscultation bilaterally with normal work of breathing.  GI: Soft and nontender.  MS: No deformity or atrophy. Gait and ROM intact.  Skin: Warm and dry. Color is normal.  Neuro:  Strength and sensation are intact and no gross focal deficits noted.  Psych: Alert, appropriate and with normal affect.   LABORATORY DATA:  EKG:  EKG is ordered today. This shows sinus bradycardia.    Lab Results  Component  Value Date   WBC 7.4 11/04/2015   HGB 13.3 11/04/2015   HCT 40.7 11/04/2015   PLT 307 11/04/2015   GLUCOSE 92 11/04/2015   CHOL 165 11/04/2015   TRIG 193 (H) 11/04/2015   HDL 52 11/04/2015   LDLCALC 74 11/04/2015   ALT 14 11/04/2015   AST 18 11/04/2015   NA 137 11/04/2015   K 4.1 11/04/2015   CL 102 11/04/2015  CREATININE 0.88 11/04/2015   BUN 15 11/04/2015   CO2 23 11/04/2015   TSH 2.88 11/04/2015   HGBA1C 5.8 (H) 11/04/2015   MICROALBUR 0.3 11/04/2015    BNP (last 3 results) No results for input(s): BNP in the last 8760 hours.  ProBNP (last 3 results) No results for input(s): PROBNP in the last 8760 hours.   Other Studies Reviewed Today:  Echo Study Conclusions from 2015  - Left ventricle: The cavity size was normal. Wall thickness was increased in a pattern of mild LVH. Systolic function was normal. The estimated ejection fraction was in the range of 60% to 65%. Wall motion was normal; there were no regional wall motion abnormalities. Features are consistent with a pseudonormal left ventricular filling pattern, with concomitant abnormal relaxation and increased filling pressure (grade 2 diastolic dysfunction). - Aortic valve: There was no stenosis. - Aorta: Ascending aortic diameter: 107mm (S). - Ascending aorta: The ascending aorta was mildly dilated. - Mitral valve: No significant regurgitation. - Left atrium: The atrium was mildly dilated. - Right ventricle: The cavity size was normal. Systolic function was normal. - Right atrium: The atrium was mildly dilated. - Tricuspid valve: Peak RV-RA gradient: 70mm Hg (S). - Pulmonary arteries: PA peak pressure: 33mm Hg (S). - Inferior vena cava: The vessel was normal in size; the respirophasic diameter changes were in the normal range (= 50%); findings are consistent with normal central venous pressure. Impressions:  - Normal LV size and systolic function, EF [id number]. Mild  LV hypertrophy. Moderate diastolic dysfunction. Normal RV size and systolic function. No significant valvular abnormalities. Mildly dilated ascending aorta.  Assessment/Plan: 1. Palpitations with previous event monitor showing frequent pacs, rare and short nonsustained atrial tachycardia, short runs of afib, and also a more prolonged regular SVT which was felt to likely be atrial flutter. She has apparently declined other therapeutic strategies but she is agreeable to now to starting anticoagulation - we have reviewed the options of Warfarin vs DOAC - she would like to start Eliquis 5 mg BID. Samples provided. CHADSVASC is at least a 2 but with borderline DM - could be 3. Stop aspirin after tomorrow. She remains in NSR today.   2. Obesity - encouraged weight loss. Explained this is a risk factor for her AF.   3. History of bradycardia - stable on 1/2 dose atenolol   4. Snoring - She has declined sleep study in the past. Will discuss further with her on return.   5. Dilated aorta - noted from echo in 2015. Will arrange CTA of the aorta to follow up.   Current medicines are reviewed with the patient today.  The patient does not have concerns regarding medicines other than what has been noted above.  The following changes have been made:  See above.  Labs/ tests ordered today include:    Orders Placed This Encounter  Procedures  . Basic metabolic panel  . CBC  . EKG 12-Lead     Disposition:   FU with me in 3 months. Will see Dr. [name] as planned next June of 2018.   Patient is agreeable to this plan and will call if any problems develop in the interim.   Signed: Burtis Junes, RN, ANP-C 03/02/2016 2:00 PM  [facility] [address] [place], [place]  [address] Phone: [phone] Fax: [phone]

## 2016-03-09 ENCOUNTER — Other Ambulatory Visit: Payer: Self-pay | Admitting: Internal Medicine

## 2016-03-15 ENCOUNTER — Other Ambulatory Visit: Payer: Self-pay | Admitting: Nurse Practitioner

## 2016-03-15 MED ORDER — APIXABAN 5 MG PO TABS: 5.0000 mg | ORAL_TABLET | Freq: Two times a day (BID) | ORAL | 0 refills | Status: DC

## 2016-03-15 MED ORDER — APIXABAN 5 MG PO TABS: 5.0000 mg | ORAL_TABLET | Freq: Two times a day (BID) | ORAL | 3 refills | Status: DC

## 2016-03-15 NOTE — Telephone Encounter (Signed)
Pt's Rx was sent to pt's pharmacy as requested. Confirmation received from both pharmacies.

## 2016-03-15 NOTE — Telephone Encounter (Signed)
°*  STAT* If patient is at the pharmacy, call can be transferred to refill team.   1. Which medications need to be refilled? (please list name of each medication and dose if known) Eliquis-need a prescription to her Mail order RX and her local Rx until medicine comes in  2. Which pharmacy/location (including street and city if local pharmacy) is medication to be sent to?[facility] and her local RX is [facility]-[phone]  3. Do they need a 30 day or 90 day supply? 180 and refills for Mail order and 60 for local pharmacy

## 2016-04-07 ENCOUNTER — Other Ambulatory Visit: Admitting: *Deleted

## 2016-04-07 DIAGNOSIS — I10 Essential (primary) hypertension: Secondary | ICD-10-CM

## 2016-04-07 DIAGNOSIS — R7303 Prediabetes: Secondary | ICD-10-CM

## 2016-04-07 DIAGNOSIS — I480 Paroxysmal atrial fibrillation: Secondary | ICD-10-CM

## 2016-04-08 ENCOUNTER — Other Ambulatory Visit

## 2016-04-08 ENCOUNTER — Ambulatory Visit (INDEPENDENT_AMBULATORY_CARE_PROVIDER_SITE_OTHER)
Admission: RE | Admit: 2016-04-08 | Discharge: 2016-04-08 | Disposition: A | Discharge status: Home / Self Care | Source: Ambulatory Visit | Attending: Nurse Practitioner | Admitting: Nurse Practitioner

## 2016-04-08 DIAGNOSIS — I7789 Other specified disorders of arteries and arterioles: Secondary | ICD-10-CM

## 2016-04-08 LAB — CBC WITH DIFFERENTIAL/PLATELET
Basophils Absolute: 0 10*3/uL (ref 0.0–0.2)
Basos: 1 %
EOS (ABSOLUTE): 0.2 10*3/uL (ref 0.0–0.4)
Eos: 3 %
Hematocrit: 38.5 % (ref 34.0–46.6)
Hemoglobin: 12.8 g/dL (ref 11.1–15.9)
Immature Grans (Abs): 0 10*3/uL (ref 0.0–0.1)
Immature Granulocytes: 0 %
Lymphocytes Absolute: 1.7 10*3/uL (ref 0.7–3.1)
Lymphs: 24 %
MCH: 29.5 pg (ref 26.6–33.0)
MCHC: 33.2 g/dL (ref 31.5–35.7)
MCV: 89 fL (ref 79–97)
Monocytes Absolute: 0.7 10*3/uL (ref 0.1–0.9)
Monocytes: 11 %
Neutrophils Absolute: 4.3 10*3/uL (ref 1.4–7.0)
Neutrophils: 61 %
Platelets: 333 10*3/uL (ref 150–379)
RBC: 4.34 x10E6/uL (ref 3.77–5.28)
RDW: 13.5 % (ref 12.3–15.4)
WBC: 6.9 10*3/uL (ref 3.4–10.8)

## 2016-04-08 LAB — BASIC METABOLIC PANEL
BUN/Creatinine Ratio: 13 (ref 12–28)
BUN: 11 mg/dL (ref 8–27)
CO2: 21 mmol/L (ref 18–29)
Calcium: 9.4 mg/dL (ref 8.7–10.3)
Chloride: 100 mmol/L (ref 96–106)
Creatinine, Ser: 0.88 mg/dL (ref 0.57–1.00)
GFR calc Af Amer: 81 mL/min/{1.73_m2} (ref >59)
GFR calc non Af Amer: 70 mL/min/{1.73_m2} (ref >59)
Glucose: 107 mg/dL — ABNORMAL HIGH (ref 65–99)
Potassium: 3.8 mmol/L (ref 3.5–5.2)
Sodium: 140 mmol/L (ref 134–144)

## 2016-04-08 MED ORDER — IOPAMIDOL (ISOVUE-370) INJECTION 76%
100.0000 mL | Freq: Once | INTRAVENOUS | Status: AC | PRN
  Administered 2016-04-08: 100 mL via INTRAVENOUS

## 2016-04-12 ENCOUNTER — Other Ambulatory Visit: Payer: Self-pay | Admitting: *Deleted

## 2016-04-12 DIAGNOSIS — I712 Thoracic aortic aneurysm, without rupture, unspecified: Secondary | ICD-10-CM

## 2016-04-19 ENCOUNTER — Other Ambulatory Visit: Payer: Self-pay | Admitting: *Deleted

## 2016-05-05 ENCOUNTER — Encounter: Payer: Self-pay | Admitting: *Deleted

## 2016-05-06 ENCOUNTER — Encounter: Payer: Self-pay | Admitting: Internal Medicine

## 2016-05-06 ENCOUNTER — Ambulatory Visit (INDEPENDENT_AMBULATORY_CARE_PROVIDER_SITE_OTHER): Admitting: Internal Medicine

## 2016-05-06 VITALS — BP 142/68 | HR 52 | Temp 98.2°F | Resp 18 | Ht 68.0 in | Wt 260.0 lb

## 2016-05-06 DIAGNOSIS — Z79899 Other long term (current) drug therapy: Secondary | ICD-10-CM | POA: Diagnosis not present

## 2016-05-06 DIAGNOSIS — R7303 Prediabetes: Secondary | ICD-10-CM

## 2016-05-06 DIAGNOSIS — I10 Essential (primary) hypertension: Secondary | ICD-10-CM | POA: Diagnosis not present

## 2016-05-06 DIAGNOSIS — I480 Paroxysmal atrial fibrillation: Secondary | ICD-10-CM

## 2016-05-06 DIAGNOSIS — M25561 Pain in right knee: Secondary | ICD-10-CM | POA: Diagnosis not present

## 2016-05-06 DIAGNOSIS — E782 Mixed hyperlipidemia: Secondary | ICD-10-CM

## 2016-05-06 DIAGNOSIS — I712 Thoracic aortic aneurysm, without rupture, unspecified: Secondary | ICD-10-CM

## 2016-05-06 DIAGNOSIS — E559 Vitamin D deficiency, unspecified: Secondary | ICD-10-CM

## 2016-05-06 MED ORDER — PREDNISONE 20 MG PO TABS: ORAL_TABLET | ORAL | 0 refills | Status: DC

## 2016-05-06 NOTE — Patient Instructions (Signed)
Please take prednisone 60 mg for 1 day.  Then take 40 mg of prednisone for 3-4 days following.  If you have no relief of your knee pain we need to get you into the orthopedic doctors.

## 2016-05-06 NOTE — Progress Notes (Signed)
Assessment and Plan:  Hypertension:  -Continue medication,  -monitor blood pressure at home.  -Continue DASH diet.   -Reminder to go to the ER if any CP, SOB, nausea, dizziness, severe HA, changes vision/speech, left arm numbness and tingling, and jaw pain.  Cholesterol: -Continue diet and exercise.    Pre-diabetes: -Continue diet and exercise.   Vitamin D Def: -continue medications.   Thoracic aortic anuerysm -found incidentally on CT angio -4.2 cm, being monitored by cardiology and will likely be sent to vascular  Atrial fibrillation -followed by cards -on anticoagulations -cont rate control  Right knee pain -prednisone taper -if no relief will likely need IA injection -not a candidate for NSAIDs due to anticoagulation  Continue diet and meds as discussed. Further disposition pending results of labs.  HPI 66 y.o. female  presents for 3 month follow up with hypertension, hyperlipidemia, prediabetes and vitamin D.   Her blood pressure has been controlled at home, today their BP is BP: (!) 142/68.   She does not workout. She denies chest pain, shortness of breath, dizziness.   She is on cholesterol medication and denies myalgias. Her cholesterol is at goal. The cholesterol last visit was:   Lab Results  Component Value Date   CHOL 165 11/04/2015   HDL 52 11/04/2015   LDLCALC 74 11/04/2015   TRIG 193 (H) 11/04/2015   CHOLHDL 3.2 11/04/2015     She has been working on diet and exercise for prediabetes, and denies foot ulcerations, hyperglycemia, hypoglycemia , increased appetite, nausea, paresthesia of the feet, polydipsia, polyuria, visual disturbances, vomiting and weight loss. Last A1C in the office was:  Lab Results  Component Value Date   HGBA1C 5.8 (H) 11/04/2015    Patient is on Vitamin D supplement.  Lab Results  Component Value Date   VD25OH 19 11/04/2015      She has been following with [facility].  She reports that she had taken cellebrex for  a while.  She was told she could take tumeric instead.  She reports that she has been taking tumeric and this was giving her palpitations.  She reports that she cut back on this and is taking less of it and was still having palpitations.  She then called her cardiologist.  She went in to the office.  She stopped tumeric altogether.  She has been monitoring it with CT scan of the heart.      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  She reports that it is popping and grinding.  She felt like it is getting worse.  She reports that she had an episode yesterday that it buckled.  Current Medications:  Current Outpatient Prescriptions on File Prior to Visit  Medication Sig Dispense Refill  . apixaban (ELIQUIS) 5 MG TABS tablet Take 1 tablet (5 mg total) by mouth 2 (two) times daily. 180 tablet 3  . atenolol (TENORMIN) 50 MG tablet Take 0.5 tablets (25 mg total) by mouth daily. 45 tablet 3  . Cholecalciferol (VITAMIN D-3 PO) Take 8,000 Units by mouth daily.     Marland Kitchen CINNAMON PO Take 1,000 mg by mouth 2 (two) times daily.    . Flaxseed, Linseed, (FLAX SEEDS PO) Take 1,000 mg by mouth 2 (two) times daily.    . hydrochlorothiazide (HYDRODIURIL) 25 MG tablet TAKE 1 TABLET DAILY 90 tablet 1  . levocetirizine (XYZAL) 5 MG tablet TAKE 1 TABLET BY MOUTH EVERY DAY 90 tablet 3  . montelukast (SINGULAIR) 10  MG tablet TAKE 1 TABLET DAILY 90 tablet 3  . PREMPRO 0.625-2.5 MG tablet TAKE AS DIRECTED 84 tablet 99  . quinapril (ACCUPRIL) 40 MG tablet Take 1 tablet (40 mg total) by mouth daily. 90 tablet 2  . TURMERIC PO Take 500 mg by mouth 2 (two) times daily.      No current facility-administered medications on file prior to visit.     Medical History:  Past Medical History:  Diagnosis Date  . Achilles tendonitis    R heal, limits activity  . Allergy   . Anxiety   . Elevated hemoglobin A1c   . Hyperlipidemia   . Hypertension   . IBS (irritable bowel syndrome)   . Insomnia   . Obesity   .  Palpitations   . Vitamin D deficiency     Allergies:  Allergies  Allergen Reactions  . Ciprofloxacin   . Penicillins Other (See Comments)    Had as a child=unknown reaction  . Zoloft [Sertraline Hcl]      Review of Systems:  Review of Systems  Constitutional: Negative for chills, fever and malaise/fatigue.  HENT: Negative for congestion, ear pain and sore throat.   Eyes: Negative.   Respiratory: Negative for cough, shortness of breath and wheezing.   Cardiovascular: Negative for chest pain, palpitations and leg swelling.  Gastrointestinal: Negative for abdominal pain, blood in stool, constipation, diarrhea, heartburn and melena.  Genitourinary: Negative.   Musculoskeletal: Positive for joint pain.  Skin: Negative.   Neurological: Negative for dizziness, sensory change, loss of consciousness and headaches.  Psychiatric/Behavioral: Negative for depression. The patient is not nervous/anxious and does not have insomnia.     Family history- Review and unchanged  Social history- Review and unchanged  Physical Exam: BP (!) 142/68   Pulse (!) 52   Temp 98.2 F (36.8 C) (Temporal)   Resp 18   Ht 5\' 8"  (1.727 m)   Wt 260 lb (117.9 kg)   BMI 39.53 kg/m  Wt Readings from Last 3 Encounters:  05/06/16 260 lb (117.9 kg)  03/02/16 263 lb 12.8 oz (119.7 kg)  11/04/15 266 lb 9.6 oz (120.9 kg)    General Appearance: Well nourished well developed, in no apparent distress. Eyes: PERRLA, EOMs, conjunctiva no swelling or erythema ENT/Mouth: Ear canals normal without obstruction, swelling, erythma, discharge.  TMs normal bilaterally.  Oropharynx moist, clear, without exudate, or postoropharyngeal swelling. Neck: Supple, thyroid normal,no cervical adenopathy  Respiratory: Respiratory effort normal, Breath sounds clear A&P without rhonchi, wheeze, or rale.  No retractions, no accessory usage. Cardio: RRR with no MRGs. Brisk peripheral pulses without edema.  Abdomen: Soft, + BS,  Non  tender, no guarding, rebound, hernias, masses. Musculoskeletal: Full ROM, 5/5 strength, right knee with joint line tenderness to palpation.  No evidence of effusion.  Crepitus with active and passive ROM.  Normal ligamentous testing to right knee.   Skin: Warm, dry without rashes, lesions, ecchymosis.  Neuro: Awake and oriented X 3, Cranial nerves intact. Normal muscle tone, no cerebellar symptoms. Psych: Normal affect, Insight and Judgment appropriate.    Starlyn Skeans, PA-C 10:29 AM [facility]

## 2016-05-10 NOTE — Progress Notes (Signed)
CARDIOLOGY OFFICE NOTE  Date:  05/11/2016    [name] Date of Birth: [birth year] Medical Record [id number]  PCP:  Alesia Richards, MD  Cardiologist:  Gillian Shields    Chief Complaint  Patient presents with  . Palpitations    Follow up visit. Seen for Dr. [name]    History of Present Illness: Claudia [name] is a 66 y.o. female who presents today for a follow up visit. Seen for Dr. [name].   She has a h/o HTN, obesity, PAF and palpitations.  She wore an event monitor back in 2015 which was noted to have documented frequent pacs as well as rare nonsustained atrial tachycardia. She also has had short episodes of rate controlled afib. There was one regular and prolonged SVT which was likely atrial flutter. She has had good response to atenolol to manage palpitations. She has been noted to have declined further treatment with procedures/AAD/blood thinners despite a chadsvasc score of at least two.  She takes Celebrex daily for arthritis pain and she feels her quality of life will suffer if she has to stop this drug. Risks factors for her include obesity and snoring with probable sleep apnea - she has declined sleep study and finds the thought of wearing CPAP not appealing to her. She has been seen in the AF clinic in the past.   She was last seen back in June by Dr. [name] - seemed to be doing ok. She has had her dose of beta blocker cut back due to bradycardia.   Phone call back from October -  "Patient stated she has been having some racing heart beats after having her celebrex medication changed to Turmeric by her PCP. Patient did not have a BP or HR to report. Patient wanted to know if she could go back to taking Atenolol 50 mg (1 tablet) instead of 25 mg (1/2 tablet). Patient's Atenolol was decreased due to bradycardia. Informed patient that if her symptoms started with the change in her celebrex, then she should consult the doctor that made the changes. Patient  stated she would call her PCP and give [place] a call back if she had any other questions."  I then saw her back for a visit - said she had "cured herself" since that phone call. Had been on Tumeric - she cut the dose back and was better. CHADSVASC elevated - she was agreeable to starting Eliquis.   Comes in today. Here alone. Has totally stopped the Tumeric since last visit here due to palpitations - it really never did anything for her. Still just on half dose Atenolol. She will take an extra 1/2 if needed. Her palpitations are very fleeting - never lasting longer than a few seconds typically. Not interested in sleep study. Has had recent labs for her Eliquis - this looked ok. Has had her CT scan - reviewed with her - she will need annual follow up for this. Not checking Bp at home but does have the means to.   Past Medical History:  Diagnosis Date  . Achilles tendonitis    R heal, limits activity  . Allergy   . Anxiety   . Elevated hemoglobin A1c   . Hyperlipidemia   . Hypertension   . IBS (irritable bowel syndrome)   . Insomnia   . Obesity   . Palpitations   . Vitamin D deficiency     Past Surgical History:  Procedure Laterality Date  . CESAREAN SECTION    .  CESAREAN SECTION    . CHOLECYSTECTOMY  10 years ago  . TUBAL LIGATION Bilateral      Medications: Current Outpatient Prescriptions  Medication Sig Dispense Refill  . apixaban (ELIQUIS) 5 MG TABS tablet Take 1 tablet (5 mg total) by mouth 2 (two) times daily. 180 tablet 3  . atenolol (TENORMIN) 50 MG tablet Take 0.5 tablets (25 mg total) by mouth daily. May take extra 1/2 tab prn palpitations. 55 tablet 3  . Cholecalciferol (VITAMIN D-3 PO) Take 8,000 Units by mouth daily.     Marland Kitchen CINNAMON PO Take 1,000 mg by mouth 2 (two) times daily.    . Flaxseed, Linseed, (FLAX SEEDS PO) Take 1,000 mg by mouth 2 (two) times daily.    . hydrochlorothiazide (HYDRODIURIL) 25 MG tablet TAKE 1 TABLET DAILY 90 tablet 1  . levocetirizine (XYZAL)  5 MG tablet TAKE 1 TABLET BY MOUTH EVERY DAY 90 tablet 3  . montelukast (SINGULAIR) 10 MG tablet TAKE 1 TABLET DAILY 90 tablet 3  . PREMPRO 0.625-2.5 MG tablet TAKE AS DIRECTED 84 tablet 99  . quinapril (ACCUPRIL) 40 MG tablet Take 1 tablet (40 mg total) by mouth daily. 90 tablet 2   No current facility-administered medications for this visit.     Allergies: Allergies  Allergen Reactions  . Ciprofloxacin   . Penicillins Other (See Comments)    Had as a child=unknown reaction  . Zoloft [Sertraline Hcl]     Social History: The patient  reports that she quit smoking about 12 years ago. She has never used smokeless tobacco. She reports that she drinks alcohol. She reports that she does not use drugs.   Family History: The patient's family history includes Alcohol abuse in her father; Diabetes in her mother; Heart attack in her father and mother; Heart disease in her mother; Macular degeneration in her mother; Ovarian cancer in her maternal grandmother; Stroke in her mother.   Review of Systems: Please see the history of present illness.   Otherwise, the review of systems is positive for none.   All other systems are reviewed and negative.   Physical Exam: VS:  BP 140/88   Pulse (!) 52   Ht 5\' 8"  (1.727 m)   Wt 258 lb 12.8 oz (117.4 kg)   SpO2 98% Comment: at rest  BMI 39.35 kg/m  .  BMI Body mass index is 39.35 kg/m.  Wt Readings from Last 3 Encounters:  05/11/16 258 lb 12.8 oz (117.4 kg)  05/06/16 260 lb (117.9 kg)  03/02/16 263 lb 12.8 oz (119.7 kg)    General: Pleasant. Obese female who is alert and in no acute distress.   HEENT: Normal.  Neck: Supple, no JVD, carotid bruits, or masses noted.  Cardiac: Regular rate and rhythm. No murmurs, rubs, or gallops. No edema.  Respiratory:  Lungs are clear to auscultation bilaterally with normal work of breathing.  GI: Soft and nontender.  MS: No deformity or atrophy. Gait and ROM intact.  Skin: Warm and dry. Color is normal.    Neuro:  Strength and sensation are intact and no gross focal deficits noted.  Psych: Alert, appropriate and with normal affect.   LABORATORY DATA:  EKG:  EKG is not ordered today.  Lab Results  Component Value Date   WBC 6.9 04/07/2016   HGB 13.3 11/04/2015   HCT 38.5 04/07/2016   PLT 333 04/07/2016   GLUCOSE 107 (H) 04/07/2016   CHOL 165 11/04/2015   TRIG 193 (H) 11/04/2015  HDL 52 11/04/2015   LDLCALC 74 11/04/2015   ALT 14 11/04/2015   AST 18 11/04/2015   NA 140 04/07/2016   K 3.8 04/07/2016   CL 100 04/07/2016   CREATININE 0.88 04/07/2016   BUN 11 04/07/2016   CO2 21 04/07/2016   TSH 2.88 11/04/2015   HGBA1C 5.8 (H) 11/04/2015   MICROALBUR 0.3 11/04/2015    BNP (last 3 results) No results for input(s): BNP in the last 8760 hours.  ProBNP (last 3 results) No results for input(s): PROBNP in the last 8760 hours.   Other Studies Reviewed Today:  Echo Study Conclusions from 2015  - Left ventricle: The cavity size was normal. Wall thickness was increased in a pattern of mild LVH. Systolic function was normal. The estimated ejection fraction was in the range of 60% to 65%. Wall motion was normal; there were no regional wall motion abnormalities. Features are consistent with a pseudonormal left ventricular filling pattern, with concomitant abnormal relaxation and increased filling pressure (grade 2 diastolic dysfunction). - Aortic valve: There was no stenosis. - Aorta: Ascending aortic diameter: 68mm (S). - Ascending aorta: The ascending aorta was mildly dilated. - Mitral valve: No significant regurgitation. - Left atrium: The atrium was mildly dilated. - Right ventricle: The cavity size was normal. Systolic function was normal. - Right atrium: The atrium was mildly dilated. - Tricuspid valve: Peak RV-RA gradient: 32mm Hg (S). - Pulmonary arteries: PA peak pressure: 7mm Hg (S). - Inferior vena cava: The vessel was normal in size;  the respirophasic diameter changes were in the normal range (= 50%); findings are consistent with normal central venous pressure. Impressions:  - Normal LV size and systolic function, EF [id number]. Mild LV hypertrophy. Moderate diastolic dysfunction. Normal RV size and systolic function. No significant valvular abnormalities. Mildly dilated ascending aorta.   CTA CHEST MPRESSION 04/2016: Ascending thoracic aortic diameter measures 4.2 x 4.0 cm. Recommend annual imaging followup by CTA or MRA. This recommendation follows 2010 ACCF/AHA/AATS/ACR/ASA/SCA/SCAI/SIR/STS/SVM Guidelines for the Diagnosis and Management of Patients with Thoracic Aortic Disease. Circulation. 2010; 121SP:1689793. No thoracic aortic dissection.  No edema or consolidation. 3 mm nodular opacity right upper lobe anteriorly. No follow-up needed if patient is low-risk. Non-contrast chest CT can be considered in 12 months if patient is high-risk. This recommendation follows the consensus statement: Guidelines for Management of Incidental Pulmonary Nodules Detected on CT Images: From the Fleischner Society 2017; Radiology 2017; 284:228-243.   Assessment/Plan: 1. Palpitations with previous event monitor showing frequent pacs, rare and short nonsustained atrial tachycardia, short runs of afib, and also a more prolonged regular SVT which was felt to likely be atrial flutter. She has agreed to anticoagulation - now on Eliquis 5 mg BID. CHADSVASC is at least a 2 but with borderline DM - could be 3. Some palpitations noted but very fleeting in duration. I have left her on her current regimen for now.   2. Obesity - encouraged weight loss. Explained this is a risk factor for her AF.   3. History of bradycardia - stable on 1/2 dose atenolol. She does use extra prn.   4. Snoring - She has declined sleep study in the past. Declined again today despite this being a risk factor for AF.   5. Dilated aorta - recent  CTA - will need recheck in one year. Needs good BP control.   Current medicines are reviewed with the patient today.  The patient does not have concerns regarding medicines other than what has  been noted above.  The following changes have been made:  See above.  Labs/ tests ordered today include:   No orders of the defined types were placed in this encounter.    Disposition:   FU with me in one year. She is seeing PCP every 3 to 4 months. Will be available as needed.    Patient is agreeable to this plan and will call if any problems develop in the interim.   SignedTruitt Merle, NP  05/11/2016 11:29 AM  [facility] [address] [place], [place]  [address] Phone: [phone] Fax: [phone]

## 2016-05-11 ENCOUNTER — Encounter (INDEPENDENT_AMBULATORY_CARE_PROVIDER_SITE_OTHER): Payer: Self-pay

## 2016-05-11 ENCOUNTER — Encounter: Payer: Self-pay | Admitting: Nurse Practitioner

## 2016-05-11 ENCOUNTER — Ambulatory Visit (INDEPENDENT_AMBULATORY_CARE_PROVIDER_SITE_OTHER): Admitting: Nurse Practitioner

## 2016-05-11 VITALS — BP 140/88 | HR 52 | Ht 68.0 in | Wt 258.8 lb

## 2016-05-11 DIAGNOSIS — R002 Palpitations: Secondary | ICD-10-CM

## 2016-05-11 DIAGNOSIS — I10 Essential (primary) hypertension: Secondary | ICD-10-CM

## 2016-05-11 DIAGNOSIS — Z7901 Long term (current) use of anticoagulants: Secondary | ICD-10-CM

## 2016-05-11 DIAGNOSIS — I480 Paroxysmal atrial fibrillation: Secondary | ICD-10-CM | POA: Diagnosis not present

## 2016-05-11 MED ORDER — ATENOLOL 50 MG PO TABS: 25.0000 mg | ORAL_TABLET | Freq: Every day | ORAL | 3 refills | Status: DC

## 2016-05-11 NOTE — Patient Instructions (Addendum)
We will be checking the following labs today - NONE   Medication Instructions:    Continue with your current medicines.   I sent in your refill for the Atenolol    Testing/Procedures To Be Arranged:  N/A  Follow-Up:   See me in one year    Other Special Instructions:   Keep a check on your BP for me  Sign up for My Chart  Let me know if you would like to proceed with a sleep study - I can arrange.     If you need a refill on your cardiac medications before your next appointment, please call your pharmacy.   Call the [place] office at [phone] if you have any questions, problems or concerns.

## 2016-05-17 ENCOUNTER — Encounter: Payer: Self-pay | Admitting: Nurse Practitioner

## 2016-05-17 ENCOUNTER — Other Ambulatory Visit: Payer: Self-pay | Admitting: Internal Medicine

## 2016-05-17 DIAGNOSIS — M25569 Pain in unspecified knee: Secondary | ICD-10-CM

## 2016-06-03 ENCOUNTER — Other Ambulatory Visit: Payer: Self-pay | Admitting: Internal Medicine

## 2016-08-12 ENCOUNTER — Ambulatory Visit: Payer: Self-pay | Admitting: Internal Medicine

## 2016-08-13 DIAGNOSIS — I712 Thoracic aortic aneurysm, without rupture, unspecified: Secondary | ICD-10-CM | POA: Insufficient documentation

## 2016-08-13 NOTE — Progress Notes (Signed)
Assessment and Plan:  Hypertension:  -Continue medication,  -monitor blood pressure at home.  -Continue DASH diet.   -Reminder to go to the ER if any CP, SOB, nausea, dizziness, severe HA, changes vision/speech, left arm numbness and tingling, and jaw pain.  Cholesterol: -Continue diet and exercise.    Pre-diabetes: -Continue diet and exercise.   Vitamin D Def: -continue medications.   Thoracic aortic anuerysm Being monitored  Atrial fibrillation -followed by cards -on anticoagulations -cont rate control   Continue diet and meds as discussed. Further disposition pending results of labs. Future Appointments Date Time Provider [place]  09/15/2016 11:20 AM Josue Hector, MD [facility]  10/25/2016 3:00 PM Unk Pinto, MD [facility] None  05/10/2017 11:00 AM Burtis Junes, NP [facility]    HPI 66 y.o. female  presents for 3 month follow up with hypertension, hyperlipidemia, prediabetes and vitamin D.   Her blood pressure has been controlled at home, today their BP is BP: 126/82.   She does not workout. She denies chest pain, shortness of breath, dizziness. She has been following with [facility], has Afib, has Thoracic aortic anuerysm, on elliquis.    She is NOT on cholesterol medication and denies myalgias. Her cholesterol is at goal. The cholesterol last visit was:   Lab Results  Component Value Date   CHOL 165 11/04/2015   HDL 52 11/04/2015   LDLCALC 74 11/04/2015   TRIG 193 (H) 11/04/2015   CHOLHDL 3.2 11/04/2015    She has been working on diet and exercise for prediabetes, and denies foot ulcerations, hyperglycemia, hypoglycemia , increased appetite, nausea, paresthesia of the feet, polydipsia, polyuria, visual disturbances, vomiting and weight loss. Last A1C in the office was:  Lab Results  Component Value Date   HGBA1C 5.8 (H) 11/04/2015   Patient is on Vitamin D supplement.  Lab Results  Component Value Date    VD25OH 63 11/04/2015     BMI is Body mass index is 39.2 kg/m., she is working on diet and exercise. Snores but no morning fatigue, no witnessed apnea, weight loss advised Wt Readings from Last 3 Encounters:  08/17/16 257 lb 12.8 oz (116.9 kg)  05/11/16 258 lb 12.8 oz (117.4 kg)  05/06/16 260 lb (117.9 kg)     Current Medications:  Current Outpatient Prescriptions on File Prior to Visit  Medication Sig Dispense Refill  . apixaban (ELIQUIS) 5 MG TABS tablet Take 1 tablet (5 mg total) by mouth 2 (two) times daily. 180 tablet 3  . atenolol (TENORMIN) 50 MG tablet Take 0.5 tablets (25 mg total) by mouth daily. May take extra 1/2 tab prn palpitations. 55 tablet 3  . Cholecalciferol (VITAMIN D-3 PO) Take 8,000 Units by mouth daily.     Marland Kitchen CINNAMON PO Take 1,000 mg by mouth 2 (two) times daily.    . Flaxseed, Linseed, (FLAX SEEDS PO) Take 1,000 mg by mouth 2 (two) times daily.    . hydrochlorothiazide (HYDRODIURIL) 25 MG tablet TAKE 1 TABLET DAILY 90 tablet 1  . levocetirizine (XYZAL) 5 MG tablet TAKE 1 TABLET BY MOUTH EVERY DAY 90 tablet 3  . montelukast (SINGULAIR) 10 MG tablet TAKE 1 TABLET DAILY 90 tablet 3  . PREMPRO 0.625-2.5 MG tablet TAKE AS DIRECTED 84 tablet 99  . quinapril (ACCUPRIL) 40 MG tablet Take 1 tablet (40 mg total) by mouth daily. 90 tablet 2   No current facility-administered medications on file prior to visit.     Medical History:  Past  Medical History:  Diagnosis Date  . Achilles tendonitis    R heal, limits activity  . Allergy   . Anxiety   . Elevated hemoglobin A1c   . Hyperlipidemia   . Hypertension   . IBS (irritable bowel syndrome)   . Insomnia   . Obesity   . Palpitations   . Vitamin D deficiency     Allergies:  Allergies  Allergen Reactions  . Ciprofloxacin   . Penicillins Other (See Comments)    Had as a child=unknown reaction  . Zoloft [Sertraline Hcl]      Review of Systems:  Review of Systems  Constitutional: Negative for chills,  fever and malaise/fatigue.  HENT: Negative for congestion, ear pain and sore throat.   Eyes: Negative.   Respiratory: Negative for cough, shortness of breath and wheezing.   Cardiovascular: Negative for chest pain, palpitations and leg swelling.  Gastrointestinal: Negative for abdominal pain, blood in stool, constipation, diarrhea, heartburn and melena.  Genitourinary: Negative.   Musculoskeletal: Negative for joint pain.  Skin: Negative.   Neurological: Negative for dizziness, sensory change, loss of consciousness and headaches.  Psychiatric/Behavioral: Negative for depression. The patient is not nervous/anxious and does not have insomnia.     Family history- Review and unchanged  Social history- Review and unchanged  Physical Exam: BP 126/82   Pulse 86   Temp 97.5 F (36.4 C)   Resp 16   Ht 5\' 8"  (1.727 m)   Wt 257 lb 12.8 oz (116.9 kg)   SpO2 98%   BMI 39.20 kg/m  Wt Readings from Last 3 Encounters:  08/17/16 257 lb 12.8 oz (116.9 kg)  05/11/16 258 lb 12.8 oz (117.4 kg)  05/06/16 260 lb (117.9 kg)    General Appearance: Well nourished well developed, in no apparent distress. Eyes: PERRLA, EOMs, conjunctiva no swelling or erythema ENT/Mouth: Ear canals normal without obstruction, swelling, erythma, discharge.  TMs normal bilaterally.  Oropharynx moist, clear, without exudate, or postoropharyngeal swelling. Neck: Supple, thyroid normal,no cervical adenopathy  Respiratory: Respiratory effort normal, Breath sounds clear A&P without rhonchi, wheeze, or rale.  No retractions, no accessory usage. Cardio: RRR with no MRGs. Brisk peripheral pulses without edema.  Abdomen: Soft, + BS,  Non tender, no guarding, rebound, hernias, masses. Musculoskeletal: Full ROM, 5/5 strength.  Skin: Warm, dry without rashes, lesions, ecchymosis.  Neuro: Awake and oriented X 3, Cranial nerves intact. Normal muscle tone, no cerebellar symptoms. Psych: Normal affect, Insight and Judgment  appropriate.    Vicie Mutters, PA-C 11:12 AM [facility]

## 2016-08-17 ENCOUNTER — Encounter: Payer: Self-pay | Admitting: Physician Assistant

## 2016-08-17 ENCOUNTER — Ambulatory Visit (INDEPENDENT_AMBULATORY_CARE_PROVIDER_SITE_OTHER): Admitting: Physician Assistant

## 2016-08-17 VITALS — BP 126/82 | HR 86 | Temp 97.5°F | Resp 16 | Ht 68.0 in | Wt 257.8 lb

## 2016-08-17 DIAGNOSIS — E782 Mixed hyperlipidemia: Secondary | ICD-10-CM

## 2016-08-17 DIAGNOSIS — Z79899 Other long term (current) drug therapy: Secondary | ICD-10-CM | POA: Diagnosis not present

## 2016-08-17 DIAGNOSIS — I471 Supraventricular tachycardia, unspecified: Secondary | ICD-10-CM

## 2016-08-17 DIAGNOSIS — R7303 Prediabetes: Secondary | ICD-10-CM | POA: Diagnosis not present

## 2016-08-17 DIAGNOSIS — I480 Paroxysmal atrial fibrillation: Secondary | ICD-10-CM | POA: Diagnosis not present

## 2016-08-17 DIAGNOSIS — E559 Vitamin D deficiency, unspecified: Secondary | ICD-10-CM

## 2016-08-17 DIAGNOSIS — I712 Thoracic aortic aneurysm, without rupture, unspecified: Secondary | ICD-10-CM

## 2016-08-17 DIAGNOSIS — I10 Essential (primary) hypertension: Secondary | ICD-10-CM | POA: Diagnosis not present

## 2016-08-17 LAB — HEPATIC FUNCTION PANEL
ALK PHOS: 58 U/L (ref 33–130)
ALT: 13 U/L (ref 6–29)
AST: 17 U/L (ref 10–35)
Albumin: 3.9 g/dL (ref 3.6–5.1)
BILIRUBIN DIRECT: 0.1 mg/dL (ref <=0.2)
BILIRUBIN INDIRECT: 0.5 mg/dL (ref 0.2–1.2)
TOTAL PROTEIN: 7.2 g/dL (ref 6.1–8.1)
Total Bilirubin: 0.6 mg/dL (ref 0.2–1.2)

## 2016-08-17 LAB — CBC WITH DIFFERENTIAL/PLATELET
BASOS PCT: 0 %
Basophils Absolute: 0 cells/uL (ref 0–200)
Eosinophils Absolute: 243 cells/uL (ref 15–500)
Eosinophils Relative: 3 %
HEMATOCRIT: 40.6 % (ref 35.0–45.0)
HEMOGLOBIN: 13.5 g/dL (ref 11.7–15.5)
LYMPHS ABS: 2511 {cells}/uL (ref 850–3900)
Lymphocytes Relative: 31 %
MCH: 29.3 pg (ref 27.0–33.0)
MCHC: 33.3 g/dL (ref 32.0–36.0)
MCV: 88.1 fL (ref 80.0–100.0)
MONO ABS: 810 {cells}/uL (ref 200–950)
MPV: 10.3 fL (ref 7.5–12.5)
Monocytes Relative: 10 %
NEUTROS ABS: 4536 {cells}/uL (ref 1500–7800)
NEUTROS PCT: 56 %
Platelets: 315 10*3/uL (ref 140–400)
RBC: 4.61 MIL/uL (ref 3.80–5.10)
RDW: 13.4 % (ref 11.0–15.0)
WBC: 8.1 10*3/uL (ref 3.8–10.8)

## 2016-08-17 LAB — BASIC METABOLIC PANEL WITH GFR
BUN: 15 mg/dL (ref 7–25)
CHLORIDE: 102 mmol/L (ref 98–110)
CO2: 25 mmol/L (ref 20–31)
Calcium: 9.5 mg/dL (ref 8.6–10.4)
Creat: 0.87 mg/dL (ref 0.50–0.99)
GFR, EST NON AFRICAN AMERICAN: 71 mL/min (ref >=60)
GFR, Est African American: 82 mL/min (ref >=60)
GLUCOSE: 94 mg/dL (ref 65–99)
POTASSIUM: 4.5 mmol/L (ref 3.5–5.3)
Sodium: 138 mmol/L (ref 135–146)

## 2016-08-17 LAB — LIPID PANEL
Cholesterol: 153 mg/dL (ref <200)
HDL: 40 mg/dL — ABNORMAL LOW (ref >50)
LDL Cholesterol: 54 mg/dL (ref <100)
Total CHOL/HDL Ratio: 3.8 Ratio (ref <5.0)
Triglycerides: 297 mg/dL — ABNORMAL HIGH (ref <150)
VLDL: 59 mg/dL — AB (ref <30)

## 2016-08-17 LAB — MAGNESIUM: Magnesium: 1.9 mg/dL (ref 1.5–2.5)

## 2016-08-17 NOTE — Patient Instructions (Signed)
Bad carbs also include fruit juice, alcohol, and sweet tea. These are empty calories that do not signal to your brain that you are full.   Please remember the good carbs are still carbs which convert into sugar. So please measure them out no more than 1/2-1 cup of rice, oatmeal, pasta, and beans  Veggies are however free foods! Pile them on.   Not all fruit is created equal. Please see the list below, the fruit at the bottom is higher in sugars than the fruit at the top. Please avoid all dried fruits.      Simple math prevails.    1st - exercise does not produce significant weight loss - at best one converts fat into muscle , "bulks up", loses inches, but usually stays "weight neutral"     2nd - think of your body weightas a check book: If you eat more calories than you burn up - you save money or gain weight .... Or if you spend more money than you put in the check book, ie burn up more calories than you eat, then you lose weight     3rd - if you walk or run 1 mile, you burn up 100 calories - you have to burn up 3,500 calories to lose 1 pound, ie you have to walk/run 35 miles to lose 1 measly pound. So if you want to lose 10 #, then you have to walk/run 350 miles, so.... clearly exercise is not the solution.     4. So if you consume 1,500 calories, then you have to burn up the equivalent of 15 miles to stay weight neutral - It also stands to reason that if you consume 1,500 cal/day and don't lose weight, then you must be burning up about 1,500 cals/day to stay weight neutral.     5. If you really want to lose weight, you must cut your calorie intake 300 calories /day and at that rate you should lose about 1 # every 3 days.   6. Please purchase Dr Fara Olden Fuhrman's book(s) "The End of Dieting" & "Eat to Live" . It has some great concepts and recipes.     Before you even begin to attack a weight-loss plan, it pays to remember this: You are not fat. You have fat. Losing weight isn't about  blame or shame; it's simply another achievement to accomplish. Dieting is like any other skill-you have to buckle down and work at it. As long as you act in a smart, reasonable way, you'll ultimately get where you want to be. Here are some weight loss pearls for you.  1. It's Not a Diet. It's a Lifestyle Thinking of a diet as something you're on and suffering through only for the short term doesn't work. To shed weight and keep it off, you need to make permanent changes to the way you eat. It's OK to indulge occasionally, of course, but if you cut calories temporarily and then revert to your old way of eating, you'll gain back the weight quicker than you can say yo-yo. Use it to lose it. Research shows that one of the best predictors of long-term weight loss is how many pounds you drop in the first month. For that reason, nutritionists often suggest being stricter for the first two weeks of your new eating strategy to build momentum. Cut out added sugar and alcohol and avoid unrefined carbs. After that, figure out how you can reincorporate them in a way that's healthy  and maintainable.  2. There's a Right Way to Exercise Working out burns calories and fat and boosts your metabolism by building muscle. But those trying to lose weight are notorious for overestimating the number of calories they burn and underestimating the amount they take in. Unfortunately, your system is biologically programmed to hold on to extra pounds and that means when you start exercising, your body senses the deficit and ramps up its hunger signals. If you're not diligent, you'll eat everything you burn and then some. Use it to lose it. Cardio gets all the exercise glory, but strength and interval training are the real heroes. They help you build lean muscle, which in turn increases your metabolism and calorie-burning ability 3. Don't Overreact to Mild Hunger Some people have a hard time losing weight because of hunger anxiety. To  them, being hungry is bad-something to be avoided at all costs-so they carry snacks with them and eat when they don't need to. Others eat because they're stressed out or bored. While you never want to get to the point of being ravenous (that's when bingeing is likely to happen), a hunger pang, a craving, or the fact that it's 3:00 p.m. should not send you racing for the vending machine or obsessing about the energy bar in your purse. Ideally, you should put off eating until your stomach is growling and it's difficult to concentrate.  Use it to lose it. When you feel the urge to eat, use the HALT method. Ask yourself, Am I really hungry? Or am I angry or anxious, lonely or bored, or tired? If you're still not certain, try the apple test. If you're truly hungry, an apple should seem delicious; if it doesn't, something else is going on. Or you can try drinking water and making yourself busy, if you are still hungry try a healthy snack.  4. Not All Calories Are Created Equal The mechanics of weight loss are pretty simple: Take in fewer calories than you use for energy. But the kind of food you eat makes all the difference. Processed food that's high in saturated fat and refined starch or sugar can cause inflammation that disrupts the hormone signals that tell your brain you're full. The result: You eat a lot more.  Use it to lose it. Clean up your diet. Swap in whole, unprocessed foods, including vegetables, lean protein, and healthy fats that will fill you up and give you the biggest nutritional bang for your calorie buck. In a few weeks, as your brain starts receiving regular hunger and fullness signals once again, you'll notice that you feel less hungry overall and naturally start cutting back on the amount you eat.  5. Protein, Produce, and Plant-Based Fats Are Your Weight-Loss Trinity Here's why eating the three Ps regularly will help you drop pounds. Protein fills you up. You need it to build lean muscle,  which keeps your metabolism humming so that you can torch more fat. People in a weight-loss program who ate double the recommended daily allowance for protein (about 110 grams for a 150-pound woman) lost 70 percent of their weight from fat, while people who ate the RDA lost only about 40 percent, one study found. Produce is packed with filling fiber. "It's very difficult to consume too many calories if you're eating a lot of vegetables. Example: Three cups of broccoli is a lot of food, yet only 93 calories. (Fruit is another story. It can be easy to overeat and can contain a lot of  calories from sugar, so be sure to monitor your intake.) Plant-based fats like olive oil and those in avocados and nuts are healthy and extra satiating.  Use it to lose it. Aim to incorporate each of the three Ps into every meal and snack. People who eat protein throughout the day are able to keep weight off, according to a study in the [place] of Clinical Nutrition. In addition to meat, poultry and seafood, good sources are beans, lentils, eggs, tofu, and yogurt. As for fat, keep portion sizes in check by measuring out salad dressing, oil, and nut butters (shoot for one to two tablespoons). Finally, eat veggies or a little fruit at every meal. People who did that consumed 308 fewer calories but didn't feel any hungrier than when they didn't eat more produce.  7. How You Eat Is As Important As What You Eat In order for your brain to register that you're full, you need to focus on what you're eating. Sit down whenever you eat, preferably at a table. Turn off the TV or computer, put down your phone, and look at your food. Smell it. Chew slowly, and don't put another bite on your fork until you swallow. When women ate lunch this attentively, they consumed 30 percent less when snacking later than those who listened to an audiobook at lunchtime, according to a study in the [place] of Nutrition. 8. Weighing Yourself  Really Works The scale provides the best evidence about whether your efforts are paying off. Seeing the numbers tick up or down or stagnate is motivation to keep going-or to rethink your approach. A 2015 study at [facility] found that daily weigh-ins helped people lose more weight, keep it off, and maintain that loss, even after two years. Use it to lose it. Step on the scale at the same time every day for the best results. If your weight shoots up several pounds from one weigh-in to the next, don't freak out. Eating a lot of salt the night before or having your period is the likely culprit. The number should return to normal in a day or two. It's a steady climb that you need to do something about. 9. Too Much Stress and Too Little Sleep Are Your Enemies When you're tired and frazzled, your body cranks up the production of cortisol, the stress hormone that can cause carb cravings. Not getting enough sleep also boosts your levels of ghrelin, a hormone associated with hunger, while suppressing leptin, a hormone that signals fullness and satiety. People on a diet who slept only five and a half hours a night for two weeks lost 55 percent less fat and were hungrier than those who slept eight and a half hours, according to a study in the [place]. Use it to lose it. Prioritize sleep, aiming for seven hours or more a night, which research shows helps lower stress. And make sure you're getting quality zzz's. If a snoring spouse or a fidgety cat wakes you up frequently throughout the night, you may end up getting the equivalent of just four hours of sleep, according to a study from [facility]. Keep pets out of the bedroom, and use a white-noise app to drown out snoring. 10. You Will Hit a plateau-And You Can Bust Through It As you slim down, your body releases much less leptin, the fullness hormone.  If you're not strength training, start right now. Building muscle can  raise your metabolism to help you overcome  a plateau. To keep your body challenged and burning calories, incorporate new moves and more intense intervals into your workouts or add another sweat session to your weekly routine. Alternatively, cut an extra 100 calories or so a day from your diet. Now that you've lost weight, your body simply doesn't need as much fuel.

## 2016-08-18 LAB — HEMOGLOBIN A1C
HEMOGLOBIN A1C: 5.6 % (ref <5.7)
Mean Plasma Glucose: 114 mg/dL

## 2016-08-18 LAB — TSH: TSH: 0.8 mIU/L

## 2016-09-12 NOTE — Progress Notes (Signed)
CARDIOLOGY OFFICE NOTE  Date:  09/15/2016    Claudia [name] Date of Birth: [birth year] Medical Record #[id number]  PCP:  Unk Pinto, MD  Cardiologist:  Gillian Shields    Chief Complaint  Patient presents with  . Atrial Fibrillation    History of Present Illness: Claudia [name] is a 66 y.o. 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 History of bradycardia on low dose beta blocker Likely OSA declines sleep study. Seems that Celebrex and Tumeric can exacerbate her palpitations. 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 Achilles tendonitis    R heal, limits activity  . Allergy   . Anxiety   . Elevated hemoglobin A1c   . Hyperlipidemia   . Hypertension   . IBS (irritable bowel syndrome)   . Insomnia   . Obesity   . Palpitations   . Vitamin D deficiency     Past Surgical History:  Procedure Laterality Date  . CESAREAN SECTION    . CESAREAN SECTION    . CHOLECYSTECTOMY  10 years ago  . TUBAL LIGATION Bilateral      Medications: Current Outpatient Prescriptions  Medication Sig Dispense Refill  . apixaban (ELIQUIS) 5 MG TABS tablet Take 1 tablet (5 mg total) by mouth 2 (two) times daily. 180 tablet 3  . atenolol (TENORMIN) 50 MG tablet Take 0.5 tablets (25 mg total) by mouth daily. May take extra 1/2 tab prn palpitations. 55 tablet 3  . Cholecalciferol (VITAMIN D-3 PO) Take 8,000 Units by mouth daily.     Marland Kitchen CINNAMON PO Take 1,000 mg by mouth 2 (two) times daily.    . Flaxseed, Linseed, (FLAX SEEDS PO) Take 1,000 mg by mouth 2 (two) times daily.    . hydrochlorothiazide (HYDRODIURIL) 25  MG tablet TAKE 1 TABLET DAILY 90 tablet 1  . levocetirizine (XYZAL) 5 MG tablet TAKE 1 TABLET BY MOUTH EVERY DAY 90 tablet 3  . montelukast (SINGULAIR) 10 MG tablet Take 10 mg by mouth daily.    Marland Kitchen PREMPRO 0.625-2.5 MG tablet TAKE AS DIRECTED 84 tablet 99  . quinapril (ACCUPRIL) 40 MG tablet Take 1 tablet (40 mg total) by mouth daily. 90 tablet 2   No current facility-administered medications for this visit.     Allergies: Allergies  Allergen Reactions  . Ciprofloxacin   . Penicillins Other (See Comments)    Had as a child=unknown reaction  . Zoloft [Sertraline Hcl]     Social History: The patient  reports that she quit smoking about 12 years ago. She has never used smokeless tobacco. She reports that she drinks alcohol. She reports that she does not use drugs.   Family History: The patient's family history includes Alcohol abuse in her father; Diabetes in her mother; Heart attack in her father and mother; Heart disease in her mother; Macular degeneration in her mother; Ovarian cancer in her maternal grandmother; Stroke in her mother.   Review of Systems: Please see the history of present illness.   Otherwise, the review of systems is positive for none.   All  other systems are reviewed and negative.   Physical Exam: VS:  BP 140/66   Pulse (!) 55   Ht 5\' 8"  (1.727 m)   Wt 118.2 kg (260 lb 9.6 oz)   SpO2 98%   BMI 39.62 kg/m  .  BMI Body mass index is 39.62 kg/m.  Wt Readings from Last 3 Encounters:  09/15/16 118.2 kg (260 lb 9.6 oz)  08/17/16 116.9 kg (257 lb 12.8 oz)  05/11/16 117.4 kg (258 lb 12.8 oz)    General: Pleasant. Obese female who is alert and in no acute distress.   HEENT: Normal.  Neck: Supple, no JVD, carotid bruits, or masses noted.  Cardiac: Regular rate and rhythm. No murmurs, rubs, or gallops. No edema.  Respiratory:  Lungs are clear to auscultation bilaterally with normal work of breathing.  GI: Soft and nontender.  MS: No deformity or atrophy. Gait  and ROM intact.  Skin: Warm and dry. Color is normal.  Neuro:  Strength and sensation are intact and no gross focal deficits noted.  Psych: Alert, appropriate and with normal affect.   LABORATORY DATA:  EKG:   SB rate 54 normal ECG   Lab Results  Component Value Date   WBC 8.1 08/17/2016   HGB 13.5 08/17/2016   HCT 40.6 08/17/2016   PLT 315 08/17/2016   GLUCOSE 94 08/17/2016   CHOL 153 08/17/2016   TRIG 297 (H) 08/17/2016   HDL 40 (L) 08/17/2016   LDLCALC 54 08/17/2016   ALT 13 08/17/2016   AST 17 08/17/2016   NA 138 08/17/2016   K 4.5 08/17/2016   CL 102 08/17/2016   CREATININE 0.87 08/17/2016   BUN 15 08/17/2016   CO2 25 08/17/2016   TSH 0.80 08/17/2016   HGBA1C 5.6 08/17/2016   MICROALBUR 0.3 11/04/2015    BNP (last 3 results) No results for input(s): BNP in the last 8760 hours.  ProBNP (last 3 results) No results for input(s): PROBNP in the last 8760 hours.   Other Studies Reviewed Today:  Echo Study Conclusions from 2015  - Left ventricle: The cavity size was normal. Wall thickness was increased in a pattern of mild LVH. Systolic function was normal. The estimated ejection fraction was in the range of 60% to 65%. Wall motion was normal; there were no regional wall motion abnormalities. Features are consistent with a pseudonormal left ventricular filling pattern, with concomitant abnormal relaxation and increased filling pressure (grade 2 diastolic dysfunction). - Aortic valve: There was no stenosis. - Aorta: Ascending aortic diameter: 57mm (S). - Ascending aorta: The ascending aorta was mildly dilated. - Mitral valve: No significant regurgitation. - Left atrium: The atrium was mildly dilated. - Right ventricle: The cavity size was normal. Systolic function was normal. - Right atrium: The atrium was mildly dilated. - Tricuspid valve: Peak RV-RA gradient: 68mm Hg (S). - Pulmonary arteries: PA peak pressure: 51mm Hg (S). - Inferior  vena cava: The vessel was normal in size; the respirophasic diameter changes were in the normal range (= 50%); findings are consistent with normal central venous pressure. Impressions:  - Normal LV size and systolic function, EF 27-51%. Mild LV hypertrophy. Moderate diastolic dysfunction. Normal RV size and systolic function. No significant valvular abnormalities. Mildly dilated ascending aorta.   CTA CHEST MPRESSION 04/2016: Ascending thoracic aortic diameter measures 4.2 x 4.0 cm. Recommend annual imaging followup by CTA or MRA. This recommendation follows 2010 ACCF/AHA/AATS/ACR/ASA/SCA/SCAI/SIR/STS/SVM Guidelines for the Diagnosis and Management of Patients with Thoracic Aortic Disease. Circulation. 2010;  121: [id number]. No thoracic aortic dissection.  No edema or consolidation. 3 mm nodular opacity right upper lobe anteriorly. No follow-up needed if patient is low-risk. Non-contrast chest CT can be considered in 12 months if patient is high-risk. This recommendation follows the consensus statement: Guidelines for Management of Incidental Pulmonary Nodules Detected on CT Images: From the Fleischner Society 2017; Radiology 2017; 284:228-243.   Assessment/Plan: 1. Palpitations with previous event monitor showing frequent pacs, rare and short nonsustained atrial tachycardia, short runs of afib, and also a more prolonged regular SVT which was felt to likely be atrial flutter. She has agreed to anticoagulation - now on Eliquis 5 mg BID. CHADSVASC is at least a 2 but with borderline DM - could be 3. Some palpitations noted but very fleeting in duration. I have left her on her current regimen for now.   2. Obesity - encouraged weight loss. Explained this is a risk factor for her AF.   3. History of bradycardia - stable on 1/2 dose atenolol. She does use extra prn.   4. Snoring - She has declined sleep study in the past. Declined again today despite this being a risk  factor for AF.   5. Dilated aorta - recent CTA  4.2 x 4.0 cm - will need recheck in one year. Needs good BP control.   Current medicines are reviewed with the patient today.  The patient does not have concerns regarding medicines other than what has been noted above.  The following changes have been made:  See above.  Labs/ tests ordered today include:   No orders of the defined types were placed in this encounter.    F/U in a year  Jenkins Rouge, MD

## 2016-09-15 ENCOUNTER — Ambulatory Visit (INDEPENDENT_AMBULATORY_CARE_PROVIDER_SITE_OTHER): Admitting: Cardiovascular Disease

## 2016-09-15 ENCOUNTER — Encounter: Payer: Self-pay | Admitting: Cardiovascular Disease

## 2016-09-15 VITALS — BP 140/66 | HR 55 | Ht 68.0 in | Wt 260.6 lb

## 2016-09-15 DIAGNOSIS — I480 Paroxysmal atrial fibrillation: Secondary | ICD-10-CM

## 2016-09-15 NOTE — Patient Instructions (Addendum)

## 2016-10-11 ENCOUNTER — Encounter: Payer: Self-pay | Admitting: Physician Assistant

## 2016-10-11 ENCOUNTER — Ambulatory Visit (INDEPENDENT_AMBULATORY_CARE_PROVIDER_SITE_OTHER): Admitting: Physician Assistant

## 2016-10-11 VITALS — BP 130/78 | HR 54 | Temp 98.9°F | Resp 14 | Ht 68.0 in | Wt 254.4 lb

## 2016-10-11 DIAGNOSIS — R002 Palpitations: Secondary | ICD-10-CM

## 2016-10-11 DIAGNOSIS — I480 Paroxysmal atrial fibrillation: Secondary | ICD-10-CM | POA: Diagnosis not present

## 2016-10-11 DIAGNOSIS — R197 Diarrhea, unspecified: Secondary | ICD-10-CM

## 2016-10-11 DIAGNOSIS — F419 Anxiety disorder, unspecified: Secondary | ICD-10-CM

## 2016-10-11 DIAGNOSIS — Z79899 Other long term (current) drug therapy: Secondary | ICD-10-CM | POA: Diagnosis not present

## 2016-10-11 LAB — BASIC METABOLIC PANEL WITH GFR
BUN: 15 mg/dL (ref 7–25)
CALCIUM: 9.3 mg/dL (ref 8.6–10.4)
CHLORIDE: 102 mmol/L (ref 98–110)
CO2: 22 mmol/L (ref 20–31)
Creat: 0.87 mg/dL (ref 0.50–0.99)
GFR, EST NON AFRICAN AMERICAN: 71 mL/min (ref >=60)
GFR, Est African American: 81 mL/min (ref >=60)
GLUCOSE: 106 mg/dL — AB (ref 65–99)
Potassium: 3.8 mmol/L (ref 3.5–5.3)
Sodium: 138 mmol/L (ref 135–146)

## 2016-10-11 LAB — CBC WITH DIFFERENTIAL/PLATELET
BASOS ABS: 0 {cells}/uL (ref 0–200)
Basophils Relative: 0 %
Eosinophils Absolute: 78 cells/uL (ref 15–500)
Eosinophils Relative: 1 %
HEMATOCRIT: 41.9 % (ref 35.0–45.0)
Hemoglobin: 13.9 g/dL (ref 11.7–15.5)
LYMPHS ABS: 1638 {cells}/uL (ref 850–3900)
Lymphocytes Relative: 21 %
MCH: 29.7 pg (ref 27.0–33.0)
MCHC: 33.2 g/dL (ref 32.0–36.0)
MCV: 89.5 fL (ref 80.0–100.0)
MPV: 10.2 fL (ref 7.5–12.5)
Monocytes Absolute: 624 cells/uL (ref 200–950)
Monocytes Relative: 8 %
NEUTROS ABS: 5460 {cells}/uL (ref 1500–7800)
NEUTROS PCT: 70 %
Platelets: 337 10*3/uL (ref 140–400)
RBC: 4.68 MIL/uL (ref 3.80–5.10)
RDW: 13.3 % (ref 11.0–15.0)
WBC: 7.8 10*3/uL (ref 3.8–10.8)

## 2016-10-11 LAB — HEPATIC FUNCTION PANEL
ALK PHOS: 67 U/L (ref 33–130)
ALT: 12 U/L (ref 6–29)
AST: 16 U/L (ref 10–35)
Albumin: 4.1 g/dL (ref 3.6–5.1)
BILIRUBIN DIRECT: 0.1 mg/dL (ref <=0.2)
BILIRUBIN INDIRECT: 0.6 mg/dL (ref 0.2–1.2)
TOTAL PROTEIN: 7.6 g/dL (ref 6.1–8.1)
Total Bilirubin: 0.7 mg/dL (ref 0.2–1.2)

## 2016-10-11 LAB — IRON AND TIBC
%SAT: 24 % (ref 11–50)
Iron: 88 ug/dL (ref 45–160)
TIBC: 370 ug/dL (ref 250–450)
UIBC: 282 ug/dL

## 2016-10-11 LAB — TSH: TSH: 0.73 mIU/L

## 2016-10-11 MED ORDER — ALPRAZOLAM 0.25 MG PO TABS
0.2500 mg | ORAL_TABLET | Freq: Three times a day (TID) | ORAL | 0 refills | Status: DC | PRN
Start: 2016-10-11 — End: 2017-10-12

## 2016-10-11 NOTE — Progress Notes (Signed)
Subjective:    Patient ID: Claudia [name], female    DOB: [birth year], 66 y.o.   MRN: [id number]  HPI 66 y.o. obese WF with history of afib on eliquis, presents with feeling shaky.  Has felt shaky/anxious x Thursday, Friday and has had this since that time intermittent. She states deep breathing helps, and being distracted helps.   She states that she researched anxiety and has several symptoms intermittent over the years including diarrhea, burping, heart burn, trouble sleeping, muscle aches. She does snore but husband states she does not stop breathing. Stress at work but no more than usual. Daughter diagnosed with celiac and she wants tested.   Blood pressure 130/78, pulse (!) 54, temperature 98.9 F (37.2 C), resp. rate 14, height 5\' 8"  (1.727 m), weight 254 lb 6.4 oz (115.4 kg), SpO2 94 %.  Medications Current Outpatient Prescriptions on File Prior to Visit  Medication Sig  . apixaban (ELIQUIS) 5 MG TABS tablet Take 1 tablet (5 mg total) by mouth 2 (two) times daily.  Marland Kitchen atenolol (TENORMIN) 50 MG tablet Take 0.5 tablets (25 mg total) by mouth daily. May take extra 1/2 tab prn palpitations.  . Cholecalciferol (VITAMIN D-3 PO) Take 8,000 Units by mouth daily.   Marland Kitchen CINNAMON PO Take 1,000 mg by mouth 2 (two) times daily.  . Flaxseed, Linseed, (FLAX SEEDS PO) Take 1,000 mg by mouth 2 (two) times daily.  . hydrochlorothiazide (HYDRODIURIL) 25 MG tablet TAKE 1 TABLET DAILY  . montelukast (SINGULAIR) 10 MG tablet Take 10 mg by mouth daily.  Marland Kitchen PREMPRO 0.625-2.5 MG tablet TAKE AS DIRECTED  . quinapril (ACCUPRIL) 40 MG tablet Take 1 tablet (40 mg total) by mouth daily.   No current facility-administered medications on file prior to visit.     Problem list She has Hypertension; Hyperlipidemia; Prediabetes; Vitamin D deficiency; IBS (irritable bowel syndrome); Insomnia; Medication management; Atrial fibrillation ([place]); SVT (supraventricular tachycardia) ([place]); Severe obesity (BMI >= 40) ([place]);  Encounter for general adult medical examination with abnormal findings; and Thoracic aortic aneurysm ([place]) on her problem list.    Review of Systems  Constitutional: Negative.   HENT: Negative.   Eyes: Negative.   Respiratory: Positive for shortness of breath (with exertion). Negative for apnea, cough, choking, chest tightness, wheezing and stridor.   Cardiovascular: Negative.  Negative for chest pain and leg swelling.  Gastrointestinal: Positive for abdominal pain. Negative for blood in stool, constipation, nausea and vomiting.       States she does get loose stool and sometimes diarrhea.  Genitourinary: Negative.   Musculoskeletal: Negative.   Skin: Negative.  Negative for pallor.  Neurological: Negative.   Psychiatric/Behavioral: The patient is nervous/anxious.        Denies stress and depression       Objective:   Physical Exam  Constitutional: She is oriented to person, place, and time. She appears well-developed and well-nourished.  HENT:  Head: Normocephalic and atraumatic.  Right Ear: External ear normal.  Left Ear: External ear normal.  Mouth/Throat: Oropharynx is clear and moist.  Eyes: Conjunctivae and EOM are normal. Pupils are equal, round, and reactive to light.  Neck: Normal range of motion. Neck supple. No thyromegaly present.  Cardiovascular: Normal rate, regular rhythm and normal heart sounds.  Exam reveals no gallop and no friction rub.   No murmur heard. Pulmonary/Chest: Effort normal and breath sounds normal. No respiratory distress. She has no wheezes.  Abdominal: Soft. Bowel sounds are normal. She exhibits no distension and no mass.  There is no tenderness. There is no rebound and no guarding.  Musculoskeletal: Normal range of motion.  Lymphadenopathy:    She has no cervical adenopathy.  Neurological: She is alert and oriented to person, place, and time. She displays normal reflexes. No cranial nerve deficit. Coordination normal.  Skin: Skin is warm and  dry.  Psychiatric: She has a normal mood and affect.       Assessment & Plan:   Paroxysmal atrial fibrillation (HCC) Some SOB with exertion, follow up with cardiology If any worsening sOB, CP, etc go to ER  Severe obesity (BMI >= 40) ([place]) - long discussion about weight loss, diet, and exercise  Anxiety? Will do low dose xanax but follow up heart doctor, check labs, get on zantac -     CBC with Differential/Platelet -     BASIC METABOLIC PANEL WITH GFR -     Hepatic function panel -     TSH -     Magnesium -     Iron and TIBC -     Vitamin B12  Diarrhea, unspecified type -     Celiac Disease Comprehensive Panel with Reflexes -     Sedimentation rate

## 2016-10-11 NOTE — Patient Instructions (Addendum)
Get on zantac twice a day 150mg  for 2 weeks see if this helps Can follow up with heart doctor if not better If any worsening shortness of breath, chest pain go to ER   Palpitations A palpitation is the feeling that your heartbeat is irregular or is faster than normal. It may feel like your heart is fluttering or skipping a beat. Palpitations are usually not a serious problem. They may be caused by many things, including smoking, caffeine, alcohol, stress, and certain medicines. Although most causes of palpitations are not serious, palpitations can be a sign of a serious medical problem. In some cases, you may need further medical evaluation. Follow these instructions at home: Pay attention to any changes in your symptoms. Take these actions to help with your condition:  Avoid the following: ? Caffeinated coffee, tea, soft drinks, diet pills, and energy drinks. ? Chocolate. ? Alcohol.  Do not use any tobacco products, such as cigarettes, chewing tobacco, and e-cigarettes. If you need help quitting, ask your health care provider.  Try to reduce your stress and anxiety. Things that can help you relax include: ? Yoga. ? Meditation. ? Physical activity, such as swimming, jogging, or walking. ? Biofeedback. This is a method that helps you learn to use your mind to control things in your body, such as your heartbeats.  Get plenty of rest and sleep.  Take over-the-counter and prescription medicines only as told by your health care provider.  Keep all follow-up visits as told by your health care provider. This is important.  Contact a health care provider if:  You continue to have a fast or irregular heartbeat after 24 hours.  Your palpitations occur more often. Get help right away if:  You have chest pain or shortness of breath.  You have a severe headache.  You feel dizzy or you faint. This information is not intended to replace advice given to you by your health care provider. Make  sure you discuss any questions you have with your health care provider. Document Released: 03/19/2000 Document Revised: 08/25/2015 Document Reviewed: 12/05/2014 Elsevier Interactive Patient Education  2017 [employer].   I think it is possible that you have sleep apnea. It can cause interrupted sleep, headaches, frequent awakenings, fatigue, dry mouth, fast/slow heart beats, memory issues, anxiety/depression, swelling, numbness tingling hands/feet, weight gain, shortness of breath, and the list goes on. Sleep apnea needs to be ruled out because if it is left untreated it does eventually lead to abnormal heart beats, lung failure or heart failure as well as increasing the risk of heart attack and stroke. There are masks you can wear OR a mouth piece that I can give you information about. Often times though people feel MUCH better after getting treatment.   Sleep Apnea  Sleep apnea is a sleep disorder characterized by abnormal pauses in breathing while you sleep. When your breathing pauses, the level of oxygen in your blood decreases. This causes you to move out of deep sleep and into light sleep. As a result, your quality of sleep is poor, and the system that carries your blood throughout your body (cardiovascular system) experiences stress. If sleep apnea remains untreated, the following conditions can develop:  High blood pressure (hypertension).  Coronary artery disease.  Inability to achieve or maintain an erection (impotence).  Impairment of your thought process (cognitive dysfunction). There are three types of sleep apnea: 1. Obstructive sleep apnea--Pauses in breathing during sleep because of a blocked airway. 2. Central sleep apnea--Pauses  in breathing during sleep because the area of the brain that controls your breathing does not send the correct signals to the muscles that control breathing. 3. Mixed sleep apnea--A combination of both obstructive and central sleep apnea.  RISK  FACTORS The following risk factors can increase your risk of developing sleep apnea:  Being overweight.  Smoking.  Having narrow passages in your nose and throat.  Being of older age.  Being female.  Alcohol use.  Sedative and tranquilizer use.  Ethnicity. Among individuals younger than 35 years, African Americans are at increased risk of sleep apnea. SYMPTOMS   Difficulty staying asleep.  Daytime sleepiness and fatigue.  Loss of energy.  Irritability.  Loud, heavy snoring.  Morning headaches.  Trouble concentrating.  Forgetfulness.  Decreased interest in sex. DIAGNOSIS  In order to diagnose sleep apnea, your caregiver will perform a physical examination. Your caregiver may suggest that you take a home sleep test. Your caregiver may also recommend that you spend the night in a sleep lab. In the sleep lab, several monitors record information about your heart, lungs, and brain while you sleep. Your leg and arm movements and blood oxygen level are also recorded. TREATMENT The following actions may help to resolve mild sleep apnea:  Sleeping on your side.   Using a decongestant if you have nasal congestion.   Avoiding the use of depressants, including alcohol, sedatives, and narcotics.   Losing weight and modifying your diet if you are overweight. There also are devices and treatments to help open your airway:  Oral appliances. These are custom-made mouthpieces that shift your lower jaw forward and slightly open your bite. This opens your airway.  Devices that create positive airway pressure. This positive pressure "splints" your airway open to help you breathe better during sleep. The following devices create positive airway pressure:  Continuous positive airway pressure (CPAP) device. The CPAP device creates a continuous level of air pressure with an air pump. The air is delivered to your airway through a mask while you sleep. This continuous pressure keeps your  airway open.  Nasal expiratory positive airway pressure (EPAP) device. The EPAP device creates positive air pressure as you exhale. The device consists of single-use valves, which are inserted into each nostril and held in place by adhesive. The valves create very little resistance when you inhale but create much more resistance when you exhale. That increased resistance creates the positive airway pressure. This positive pressure while you exhale keeps your airway open, making it easier to breath when you inhale again.  Bilevel positive airway pressure (BPAP) device. The BPAP device is used mainly in patients with central sleep apnea. This device is similar to the CPAP device because it also uses an air pump to deliver continuous air pressure through a mask. However, with the BPAP machine, the pressure is set at two different levels. The pressure when you exhale is lower than the pressure when you inhale.  Surgery. Typically, surgery is only done if you cannot comply with less invasive treatments or if the less invasive treatments do not improve your condition. Surgery involves removing excess tissue in your airway to create a wider passage way. Document Released: 03/12/2002 Document Revised: 07/17/2012 Document Reviewed: 07/29/2011 [facility] Patient Information 2015 [place], [place]. This information is not intended to replace advice given to you by your health care provider. Make sure you discuss any questions you have with your health care provider.

## 2016-10-12 ENCOUNTER — Encounter: Payer: Self-pay | Admitting: Nurse Practitioner

## 2016-10-12 ENCOUNTER — Other Ambulatory Visit: Payer: Self-pay | Admitting: *Deleted

## 2016-10-12 ENCOUNTER — Ambulatory Visit (INDEPENDENT_AMBULATORY_CARE_PROVIDER_SITE_OTHER): Admitting: Nurse Practitioner

## 2016-10-12 ENCOUNTER — Telehealth: Payer: Self-pay | Admitting: Cardiovascular Disease

## 2016-10-12 VITALS — BP 140/70 | HR 57 | Ht 68.0 in | Wt 252.0 lb

## 2016-10-12 DIAGNOSIS — I480 Paroxysmal atrial fibrillation: Secondary | ICD-10-CM | POA: Diagnosis not present

## 2016-10-12 DIAGNOSIS — R0789 Other chest pain: Secondary | ICD-10-CM | POA: Diagnosis not present

## 2016-10-12 DIAGNOSIS — R002 Palpitations: Secondary | ICD-10-CM | POA: Diagnosis not present

## 2016-10-12 LAB — VITAMIN B12: VITAMIN B 12: 352 pg/mL (ref 200–1100)

## 2016-10-12 LAB — CELIAC DISEASE COMPREHENSIVE PANEL WITH REFLEXES
IGA: 290 mg/dL (ref 81–463)
TISSUE TRANSGLUTAMINASE AB, IGA: 1 U/mL (ref <4)

## 2016-10-12 LAB — MAGNESIUM: MAGNESIUM: 1.9 mg/dL (ref 1.5–2.5)

## 2016-10-12 LAB — SEDIMENTATION RATE: SED RATE: 11 mm/h (ref 0–30)

## 2016-10-12 NOTE — Patient Instructions (Addendum)
We will be checking the following labs today - NONE   Medication Instructions:    Continue with your current medicines for now.     Testing/Procedures To Be Arranged:  Event monitor  Lexiscan Myoview  Follow-Up:   Will see how your studies turn out and then decide about follow up.    Other Special Instructions:   N/A    If you need a refill on your cardiac medications before your next appointment, please call your pharmacy.   Call the [place] office at [phone] if you have any questions, problems or concerns.

## 2016-10-12 NOTE — Progress Notes (Signed)
CARDIOLOGY OFFICE NOTE  Date:  10/12/2016    [name] Date of Birth: 1950/11/03 Medical Record #[id number]  PCP:  Unk Pinto, MD  Cardiologist:  [name]  Chief Complaint  Patient presents with  . Palpitations  . Dizziness  . Chest Pain    Work in visit - seen for Dr. [name]    History of Present Illness: Claudia [name] is a 66 y.o. female who presents today for a work in visit. Seen for Dr. [name].   She has a history of HTN, obesity, PAF, borderline DM,  on chronic eliquis for CHA2VASC 3. History of bradycardia on low dose beta blocker.   Seems that Celebrex and Tumeric can exacerbate her palpitations.   Seen by Dr. [name] just last month - felt to be doing ok. She had agreed to have sleep study.   Phone call today - "Pt called today because she has been having SOB with mild activity, shaky weak hot and sweaty, since last Thursday. Pt states she has difficulty to sleep during the night.  Pt denies chest pain. Pt was seen by her PCP yesterday. Pt's  heart rate was 54 beats/minute. PCP recommended for  pt to call her cardiologist. An appointment was made,  for today with Truitt Merle NP at 3:00 Pm today. Pt is aware to come as early as 2:25 PM today for O/V. Pt verbalized understanding". Thus added to my schedule.   Comes in today. Here alone. She has lots of complaints. Seen at PCP office yesterday - lots of labs - all ok. She notes that over the past few weeks she has felt shaky and jittery. She notes an irregular and pounding heart beat. Her stools are now loose. She has difficult focusing/concentrating. Frequent urination and heartburn. Occasional tingling in hands,feet, lips, tongue. Weak in general. Trouble getting and staying asleep. Some chest pain off and on - not really with exertion but she does not exert due to "bad knees". She has tried to cut back on sugar and has lost a few pounds. She has not proceeded with her sleep study - does not sound like she is  convinced that she needs it.   Past Medical History:  Diagnosis Date  . Achilles tendonitis    R heal, limits activity  . Allergy   . Anxiety   . Elevated hemoglobin A1c   . Hyperlipidemia   . Hypertension   . IBS (irritable bowel syndrome)   . Insomnia   . Obesity   . Palpitations   . Vitamin D deficiency     Past Surgical History:  Procedure Laterality Date  . CESAREAN SECTION    . CESAREAN SECTION    . CHOLECYSTECTOMY  10 years ago  . TUBAL LIGATION Bilateral      Medications: Current Meds  Medication Sig  . ALPRAZolam (XANAX) 0.25 MG tablet Take 1 tablet (0.25 mg total) by mouth 3 (three) times daily as needed for anxiety.  Marland Kitchen apixaban (ELIQUIS) 5 MG TABS tablet Take 1 tablet (5 mg total) by mouth 2 (two) times daily.  Marland Kitchen atenolol (TENORMIN) 50 MG tablet Take 0.5 tablets (25 mg total) by mouth daily. May take extra 1/2 tab prn palpitations.  . Cholecalciferol (VITAMIN D-3 PO) Take 8,000 Units by mouth daily.   Marland Kitchen CINNAMON PO Take 1,000 mg by mouth 2 (two) times daily.  . Flaxseed, Linseed, (FLAX SEEDS PO) Take 1,000 mg by mouth 2 (two) times daily.  . hydrochlorothiazide (HYDRODIURIL) 25 MG tablet  TAKE 1 TABLET DAILY  . montelukast (SINGULAIR) 10 MG tablet Take 10 mg by mouth daily.  Marland Kitchen PREMPRO 0.625-2.5 MG tablet TAKE AS DIRECTED  . quinapril (ACCUPRIL) 40 MG tablet Take 1 tablet (40 mg total) by mouth daily.     Allergies: Allergies  Allergen Reactions  . Ciprofloxacin   . Penicillins Other (See Comments)    Had as a child=unknown reaction  . Zoloft [Sertraline Hcl]     Social History: The patient  reports that she quit smoking about 12 years ago. She has never used smokeless tobacco. She reports that she drinks alcohol. She reports that she does not use drugs.   Family History: The patient's family history includes Alcohol abuse in her father; Diabetes in her mother; Heart attack in her father and mother; Heart disease in her mother; Macular degeneration in  her mother; Ovarian cancer in her maternal grandmother; Stroke in her mother.   Review of Systems: Please see the history of present illness.   Otherwise, the review of systems is positive for none.   All other systems are reviewed and negative.   Physical Exam: VS:  BP 140/70   Pulse (!) 57   Ht 5\' 8"  (1.727 m)   Wt 252 lb (114.3 kg)   BMI 38.32 kg/m  .  BMI Body mass index is 38.32 kg/m.  Wt Readings from Last 3 Encounters:  10/12/16 252 lb (114.3 kg)  10/11/16 254 lb 6.4 oz (115.4 kg)  09/15/16 260 lb 9.6 oz (118.2 kg)    General: Pleasant. Obese. She has lost a few pounds. She is alert and in no acute distress.   HEENT: Normal.  Neck: Supple, no JVD, carotid bruits, or masses noted.  Cardiac: Regular rate and rhythm. No murmurs, rubs, or gallops. No edema.  Respiratory:  Lungs are clear to auscultation bilaterally with normal work of breathing.  GI: Soft and nontender.  MS: No deformity or atrophy. Gait and ROM intact.  Skin: Warm and dry. Color is normal.  Neuro:  Strength and sensation are intact and no gross focal deficits noted.  Psych: Alert, appropriate and with normal affect.   LABORATORY DATA:  EKG:  EKG is ordered today. This demonstrates NSR.   Lab Results  Component Value Date   WBC 7.8 10/11/2016   HGB 13.9 10/11/2016   HCT 41.9 10/11/2016   PLT 337 10/11/2016   GLUCOSE 106 (H) 10/11/2016   CHOL 153 08/17/2016   TRIG 297 (H) 08/17/2016   HDL 40 (L) 08/17/2016   LDLCALC 54 08/17/2016   ALT 12 10/11/2016   AST 16 10/11/2016   NA 138 10/11/2016   K 3.8 10/11/2016   CL 102 10/11/2016   CREATININE 0.87 10/11/2016   BUN 15 10/11/2016   CO2 22 10/11/2016   TSH 0.73 10/11/2016   HGBA1C 5.6 08/17/2016   MICROALBUR 0.3 11/04/2015     BNP (last 3 results) No results for input(s): BNP in the last 8760 hours.  ProBNP (last 3 results) No results for input(s): PROBNP in the last 8760 hours.   Other Studies Reviewed Today:  Echo Study Conclusions  from 2015  - Left ventricle: The cavity size was normal. Wall thickness was increased in a pattern of mild LVH. Systolic function was normal. The estimated ejection fraction was in the range of 60% to 65%. Wall motion was normal; there were no regional wall motion abnormalities. Features are consistent with a pseudonormal left ventricular filling pattern, with concomitant abnormal relaxation and increased  filling pressure (grade 2 diastolic dysfunction). - Aortic valve: There was no stenosis. - Aorta: Ascending aortic diameter: 2mm (S). - Ascending aorta: The ascending aorta was mildly dilated. - Mitral valve: No significant regurgitation. - Left atrium: The atrium was mildly dilated. - Right ventricle: The cavity size was normal. Systolic function was normal. - Right atrium: The atrium was mildly dilated. - Tricuspid valve: Peak RV-RA gradient: 29mm Hg (S). - Pulmonary arteries: PA peak pressure: 41mm Hg (S). - Inferior vena cava: The vessel was normal in size; the respirophasic diameter changes were in the normal range (= 50%); findings are consistent with normal central venous pressure. Impressions:  - Normal LV size and systolic function, EF 73-41%. Mild LV hypertrophy. Moderate diastolic dysfunction. Normal RV size and systolic function. No significant valvular abnormalities. Mildly dilated ascending aorta.   CTA CHEST MPRESSION 04/2016: Ascending thoracic aortic diameter measures 4.2 x 4.0 cm. Recommend annual imaging followup by CTA or MRA. This recommendation follows 2010 ACCF/AHA/AATS/ACR/ASA/SCA/SCAI/SIR/STS/SVM Guidelines for the Diagnosis and Management of Patients with Thoracic Aortic Disease. Circulation. 2010; 121: P379-K240. No thoracic aortic dissection.  No edema or consolidation. 3 mm nodular opacity right upper lobe anteriorly. No follow-up needed if patient is low-risk. Non-contrast chest CT can be considered in 12 months  if patient is high-risk. This recommendation follows the consensus statement: Guidelines for Management of Incidental Pulmonary Nodules Detected on CT Images: From the Fleischner Society 2017; Radiology 2017; 284:228-243.   Assessment/Plan:  1. Multitude of somatic complaints - will arrange for event monitor and Lexiscan Myoview - further disposition to follow. Her labs from yesterday are all ok and are reported to her.   2. Palpitations with remote event monitor showing frequent pacs, rare and short nonsustained atrial tachycardia, short runs of afib, and also a more prolonged regular SVT which was felt tolikely be atrial flutter. She is now on anticoagulation. Now with more palpitations and worrying that "something has changed". Will repeat the study. May need to consider EP referral/possible AAD therapy.  3. Obesity - encouraged weight loss in the past - she does not exercise. Explained this is a risk factor for her AF.   4. History of bradycardia - stable on 1/2 dose atenolol. She does use extra prn.   5. Snoring - She has declinedsleep study in the past. Declined again today despite this being a risk factor for AF.   6. Dilated aorta - recent CTA  4.2 x 4.0 cm - will need recheck in one year (04/2017). Needs good BP control.   Current medicines are reviewed with the patient today.  The patient does not have concerns regarding medicines other than what has been noted above.  The following changes have been made:  See above.  Labs/ tests ordered today include:    Orders Placed This Encounter  Procedures  . Cardiac event monitor  . Myocardial Perfusion Imaging  . EKG 12-Lead     Disposition:   Further disposition to follow.    Patient is agreeable to this plan and will call if any problems develop in the interim.   SignedTruitt Merle, NP  10/12/2016 3:20 PM  [facility] [address] [place], [place]  [address] Phone:  [phone] Fax: [phone]

## 2016-10-12 NOTE — Telephone Encounter (Signed)
Pt called today because she has been having SOB with mild activity, shaky weak hot and sweaty  , since last Thursday. Pt states she has difficulty to sleep during the night.  Pt denies chest pain. Pt was seen by her PCP yesterday. Pt's  heart rate was 54 beats/minute. PCP recommended for  pt to call her cardiologist. An appointment was made,  for today with Truitt Merle NP at 3:00 Pm today. Pt is aware to come as early as 2:25 PM today for O/V. Pt verbalized understanding.

## 2016-10-12 NOTE — Telephone Encounter (Signed)
Patient called for an appt, schedule with Truitt Merle for 10-19-16 @9 :00am. Patient states that she has been feeling "shakey, weak, hot and sweating a lot." Patient mentions difficultly breathing on exertion and denies chest pain, this has been going on since last Thursday. Thanks.

## 2016-10-14 ENCOUNTER — Telehealth (HOSPITAL_COMMUNITY): Payer: Self-pay | Admitting: *Deleted

## 2016-10-14 NOTE — Telephone Encounter (Signed)
Left message on voicemail in reference to upcoming appointment scheduled for 10/18/16. Phone number given for a call back so details instructions can be given. Kirstie Peri

## 2016-10-15 ENCOUNTER — Other Ambulatory Visit: Payer: Self-pay | Admitting: Internal Medicine

## 2016-10-18 ENCOUNTER — Ambulatory Visit (HOSPITAL_COMMUNITY): Attending: Cardiovascular Disease

## 2016-10-18 DIAGNOSIS — R002 Palpitations: Secondary | ICD-10-CM

## 2016-10-18 DIAGNOSIS — R0609 Other forms of dyspnea: Secondary | ICD-10-CM | POA: Insufficient documentation

## 2016-10-18 DIAGNOSIS — R079 Chest pain, unspecified: Secondary | ICD-10-CM | POA: Diagnosis not present

## 2016-10-18 DIAGNOSIS — R0789 Other chest pain: Secondary | ICD-10-CM

## 2016-10-18 DIAGNOSIS — I480 Paroxysmal atrial fibrillation: Secondary | ICD-10-CM | POA: Diagnosis not present

## 2016-10-18 DIAGNOSIS — I10 Essential (primary) hypertension: Secondary | ICD-10-CM | POA: Insufficient documentation

## 2016-10-18 DIAGNOSIS — R9439 Abnormal result of other cardiovascular function study: Secondary | ICD-10-CM | POA: Insufficient documentation

## 2016-10-18 MED ORDER — REGADENOSON 0.4 MG/5ML IV SOLN
0.4000 mg | Freq: Once | INTRAVENOUS | Status: AC
  Administered 2016-10-18: 0.4 mg via INTRAVENOUS

## 2016-10-18 MED ORDER — TECHNETIUM TC 99M TETROFOSMIN IV KIT
33.0000 | PACK | Freq: Once | INTRAVENOUS | Status: AC | PRN
  Administered 2016-10-18: 33 via INTRAVENOUS
  Filled 2016-10-18: qty 33

## 2016-10-19 ENCOUNTER — Ambulatory Visit (HOSPITAL_COMMUNITY): Attending: Cardiology

## 2016-10-19 ENCOUNTER — Ambulatory Visit: Admitting: Nurse Practitioner

## 2016-10-19 LAB — MYOCARDIAL PERFUSION IMAGING
LV dias vol: 106 mL (ref 46–106)
LV sys vol: 34 mL
Peak HR: 77 {beats}/min
RATE: 0.3
Rest HR: 70 {beats}/min
SDS: 3
SRS: 6
SSS: 9
TID: 0.87

## 2016-10-19 MED ORDER — TECHNETIUM TC 99M TETROFOSMIN IV KIT
32.4000 | PACK | Freq: Once | INTRAVENOUS | Status: AC | PRN
  Administered 2016-10-19: 32.4 via INTRAVENOUS
  Filled 2016-10-19: qty 33

## 2016-10-24 NOTE — Progress Notes (Signed)
[facility] Unk Pinto, M.D.      Uvaldo Bristle. Silverio Lay, P.A.-C [facility]                [address] [place], [place] [address] Telephone [phone] Telefax [phone]  Annual Screening/Preventative Visit & Comprehensive Evaluation &  Examination     This very nice 66 y.o. DWF presents for a Screening/Preventative Visit & comprehensive evaluation and management of multiple medical co-morbidities.  Patient has been followed for HTN, Prediabetes, Hyperlipidemia and Vitamin D Deficiency.     Patient reports snoring and a question of apnea and has rapid fatigability and late day hypersomnolence. Patient endorses non restorative sleeping with difficulty both falling and staying asleep.      HTN predates since 1996.She does have CKD 2 (GFR 71 mk/min)   Patient had a negative Cardiolite in 2004 and is followed by Dr [name] for pAfib and is on Eliquis for ChadsVasc 3.   Patient was seen here recently on July 9th and had extensive negative lab testing for a lot of vague sx's suspect for anxiety and then seen the next day by Truitt Merle, NP with Dr [name] for sx's of tremor /anxiety and also has exertional dyspnea (w/o CP).  She's been scheduled for an event monitor and Lexiscan Myoview. Patient did again decline recommended Sleep study then. Patient's BP has been controlled at home and patient denies any cardiac symptoms as chest pain, palpitations, shortness of breath, dizziness or ankle swelling. Today's BP is elevated at 148/80.      Patient's hyperlipidemia is controlled with diet and medications. Patient denies myalgias or other medication SE's. Last lipids were at goal albeit elevated Trig's: Lab Results  Component Value Date   CHOL 153 08/17/2016   HDL 40 (L) 08/17/2016   LDLCALC 54 08/17/2016   TRIG 297 (H) 08/17/2016   CHOLHDL 3.8 08/17/2016      Patient has Severe Morbid Obesity (BMI 39+) and  prediabetes (A1c 5.8% in 2012 and 5.7% in Mar 2017) and patient denies reactive hypoglycemic symptoms, visual blurring, diabetic polys, or paresthesias. Last A1c was at goal: Lab Results  Component Value Date   HGBA1C 5.6 08/17/2016      Finally, patient has history of Vitamin D Deficiency ("18" in 2008) and last Vitamin D was at goal: Lab Results  Component Value Date   VD25OH 63 11/04/2015   Current Outpatient Prescriptions on File Prior to Visit  Medication Sig  . ALPRAZolam 0.25 MG  Take 1 tab 3 x daily as needed for anxiety.  Marland Kitchen ELIQUIS 5 MG  Take 1 tab 2 x daily.  Marland Kitchen atenolol  50 MG  Take 1/2 tab  daily. May take extra 1/2 tab prn palpitations.  Marland Kitchen VITAMIN D Take 8,000 Units  daily.   Marland Kitchen CINNAMON 1,000 mg   Take 2 x daily.  Marland Kitchen FLAX SEEDS  1,000 mg Take   2 x daily.  . hctz 25 MG  TAKE 1 TAB DAILY  . montelukast 10 MG Take  daily.  Marland Kitchen PREMPRO 0.625-2.5 MG TAKE AS DIRECTED  . quinapril  40 MG TAKE 1 TAB DAILY   Allergies  Allergen Reactions  . Ciprofloxacin   . Penicillins Other (See Comments)    Had as a child=unknown reaction  . Zoloft [Sertraline Hcl]    Past Medical History:  Diagnosis Date  . Achilles  tendonitis    R heal, limits activity  . Allergy   . Anxiety   . Elevated hemoglobin A1c   . Hyperlipidemia   . Hypertension   . IBS (irritable bowel syndrome)   . Insomnia   . Obesity   . Palpitations   . Vitamin D deficiency    Health Maintenance  Topic Date Due  . TETANUS/TDAP  10/11/1971  . PAP SMEAR  10/10/1973  . MAMMOGRAM  10/11/2002  . INFLUENZA VACCINE  11/03/2016  . COLONOSCOPY  11/04/2022  . Hepatitis C Screening  Completed  . HIV Screening  Completed   Immunization History  Administered Date(s) Administered  . Influenza Split 02/19/2013  . Influenza, Seasonal, Injecte, Preservative Fre 03/12/2015  . Influenza-Unspecified 04/06/2011  . PPD Test 10/15/2013  . Pneumococcal-Unspecified 04/06/1995  . Tdap 04/05/2006  . Zoster 07/04/2013   Past  Surgical History:  Procedure Laterality Date  . CESAREAN SECTION    . CESAREAN SECTION    . CHOLECYSTECTOMY  10 years ago  . TUBAL LIGATION Bilateral    Family History  Problem Relation Age of Onset  . Heart attack Mother   . Stroke Mother   . Heart disease Mother   . Diabetes Mother   . Macular degeneration Mother   . Heart attack Father   . Alcohol abuse Father   . Ovarian cancer Maternal Grandmother   . Colon cancer Neg Hx    Social History  Substance Use Topics  . Smoking status: Former Smoker    Quit date: 04/01/2004  . Smokeless tobacco: Never Used     Comment: Social smoker times 10 years  . Alcohol use 0.0 oz/week     Comment: rarely, < 1 beer per week    ROS Constitutional: Denies fever, chills, weight loss/gain, headaches, insomnia,  night sweats, and change in appetite. Does c/o fatigue. Eyes: Denies redness, blurred vision, diplopia, discharge, itchy, watery eyes.  ENT: Denies discharge, congestion, post nasal drip, epistaxis, sore throat, earache, hearing loss, dental pain, Tinnitus, Vertigo, Sinus pain, snoring.  Cardio: Denies chest pain, palpitations, irregular heartbeat, syncope, dyspnea, diaphoresis, orthopnea, PND, claudication, edema Respiratory: denies cough, dyspnea, DOE, pleurisy, hoarseness, laryngitis, wheezing.  Gastrointestinal: Denies dysphagia, heartburn, reflux, water brash, pain, cramps, nausea, vomiting, bloating, diarrhea, constipation, hematemesis, melena, hematochezia, jaundice, hemorrhoids Genitourinary: Denies dysuria, frequency, urgency, nocturia, hesitancy, discharge, hematuria, flank pain Breast: Breast lumps, nipple discharge, bleeding.  Musculoskeletal: Denies arthralgia, myalgia, stiffness, Jt. Swelling, pain, limp, and strain/sprain. Denies falls. Skin: Denies puritis, rash, hives, warts, acne, eczema, changing in skin lesion Neuro: No weakness, tremor, incoordination, spasms, paresthesia, pain Psychiatric: Denies confusion, memory  loss, sensory loss. Denies Depression. Endocrine: Denies change in weight, skin, hair change, nocturia, and paresthesia, diabetic polys, visual blurring, hyper / hypo glycemic episodes.  Heme/Lymph: No excessive bleeding, bruising, enlarged lymph nodes.  Physical Exam  BP (!) 148/80   Pulse (!) 52   Temp (!) 97.5 F (36.4 C)   Resp 16   Ht 5' 7.75" (1.721 m)   Wt 254 lb 12.8 oz (115.6 kg)   BMI 39.03 kg/m   General Appearance: Over nourished, well groomed and in no apparent distress.  Eyes: PERRLA, EOMs, conjunctiva no swelling or erythema, normal fundi and vessels. Sinuses: No frontal/maxillary tenderness ENT/Mouth: EACs patent / TMs  nl. Nares clear without erythema, swelling, mucoid exudates. Oral hygiene is good. No erythema, swelling, or exudate. Mallampati 2. Hearing normal.  Neck: Supple, prominent redundant waddle, thyroid not palpable. No bruits, nodes or JVD.  Respiratory: Respiratory effort normal.  BS equal and clear bilateral without rales, rhonci, wheezing or stridor. Cardio: Heart sounds are normal with regular rate and rhythm and no murmurs, rubs or gallops. Peripheral pulses are normal and equal bilaterally without edema. No aortic or femoral bruits. Chest: symmetric with normal excursions and percussion. Breasts: Symmetric, pendulous with dense lumpy  fibrocystic changes.  No nipple discharge or retractions.  Abdomen: Flat, soft with bowel sounds active. Nontender, no guarding, rebound, hernias, masses, or organomegaly.  Lymphatics: Non tender without lymphadenopathy.  Genitourinary:  Musculoskeletal: Full ROM all peripheral extremities, joint stability, 5/5 strength, and normal gait. Skin: Warm and dry without rashes, lesions, cyanosis, clubbing or  ecchymosis.  Neuro: Cranial nerves intact, reflexes equal bilaterally. Normal muscle tone, no cerebellar symptoms. Sensation intact.  Pysch: Alert and oriented X 3, normal affect, Insight and Judgment appropriate.    Assessment and Plan  1. Annual Preventative Screening Examination   2. Essential hypertension  - Urinalysis, Routine w reflex microscopic - Microalbumin / creatinine urine ratio - Ambulatory referral to Sleep Studies  3. Hyperlipidemia, mixed   4. Prediabetes  - Hemoglobin A1c - Insulin, random  5. Vitamin D deficiency  - VITAMIN D 25 Hydroxy  6. Paroxysmal atrial fibrillation (HCC)  - Ambulatory referral to Sleep Studies  7. Need for prophylactic vaccination with tetanus-diphtheria (Td)  - Td : Tetanus/diphtheria >7yo Preservative  free  8. Bilateral fibrocystic breast disease (FCBD)   9. Fibrous breast lumps   10. OSA (obstructive sleep apnea)  - Ambulatory referral to Sleep Studies  11. Fatigue, unspecified type  - Ambulatory referral to Sleep Studies  12. Insomnia, unspecified type  - Ambulatory referral to Sleep Studies  13. Medication management  - Hemoglobin A1c - Insulin, random - VITAMIN D 25 Hydroxy  - Urinalysis, Routine w reflex microscopic - Microalbumin / creatinine urine ratio       Patient was counseled in prudent diet to achieve/maintain BMI less than 25 for weight control, BP monitoring, regular exercise and medications. Discussed med's effects and SE's. Screening labs and tests as requested with regular follow-up as recommended. Over 40 minutes of exam, counseling, chart review and high complex critical decision making was performed.

## 2016-10-24 NOTE — Patient Instructions (Signed)

## 2016-10-25 ENCOUNTER — Ambulatory Visit (INDEPENDENT_AMBULATORY_CARE_PROVIDER_SITE_OTHER): Admitting: Internal Medicine

## 2016-10-25 ENCOUNTER — Encounter: Payer: Self-pay | Admitting: Internal Medicine

## 2016-10-25 VITALS — BP 148/80 | HR 52 | Temp 97.5°F | Resp 16 | Ht 67.75 in | Wt 254.8 lb

## 2016-10-25 DIAGNOSIS — Z0000 Encounter for general adult medical examination without abnormal findings: Secondary | ICD-10-CM

## 2016-10-25 DIAGNOSIS — R5383 Other fatigue: Secondary | ICD-10-CM

## 2016-10-25 DIAGNOSIS — I480 Paroxysmal atrial fibrillation: Secondary | ICD-10-CM

## 2016-10-25 DIAGNOSIS — Z0001 Encounter for general adult medical examination with abnormal findings: Secondary | ICD-10-CM

## 2016-10-25 DIAGNOSIS — N63 Unspecified lump in unspecified breast: Secondary | ICD-10-CM

## 2016-10-25 DIAGNOSIS — E782 Mixed hyperlipidemia: Secondary | ICD-10-CM

## 2016-10-25 DIAGNOSIS — Z23 Encounter for immunization: Secondary | ICD-10-CM

## 2016-10-25 DIAGNOSIS — E559 Vitamin D deficiency, unspecified: Secondary | ICD-10-CM

## 2016-10-25 DIAGNOSIS — G4700 Insomnia, unspecified: Secondary | ICD-10-CM

## 2016-10-25 DIAGNOSIS — N6012 Diffuse cystic mastopathy of left breast: Secondary | ICD-10-CM

## 2016-10-25 DIAGNOSIS — G4733 Obstructive sleep apnea (adult) (pediatric): Secondary | ICD-10-CM

## 2016-10-25 DIAGNOSIS — N6011 Diffuse cystic mastopathy of right breast: Secondary | ICD-10-CM

## 2016-10-25 DIAGNOSIS — R7303 Prediabetes: Secondary | ICD-10-CM

## 2016-10-25 DIAGNOSIS — I10 Essential (primary) hypertension: Secondary | ICD-10-CM

## 2016-10-25 DIAGNOSIS — Z79899 Other long term (current) drug therapy: Secondary | ICD-10-CM

## 2016-10-26 ENCOUNTER — Ambulatory Visit (INDEPENDENT_AMBULATORY_CARE_PROVIDER_SITE_OTHER)

## 2016-10-26 DIAGNOSIS — R002 Palpitations: Secondary | ICD-10-CM

## 2016-10-26 DIAGNOSIS — I480 Paroxysmal atrial fibrillation: Secondary | ICD-10-CM

## 2016-10-26 DIAGNOSIS — R0789 Other chest pain: Secondary | ICD-10-CM | POA: Diagnosis not present

## 2016-10-26 LAB — VITAMIN D 25 HYDROXY (VIT D DEFICIENCY, FRACTURES): Vit D, 25-Hydroxy: 65 ng/mL (ref 30–100)

## 2016-10-26 LAB — HEMOGLOBIN A1C
Hgb A1c MFr Bld: 5.5 % (ref <5.7)
Mean Plasma Glucose: 111 mg/dL

## 2016-10-26 LAB — INSULIN, RANDOM: Insulin: 40.3 u[IU]/mL — ABNORMAL HIGH (ref 2.0–19.6)

## 2016-11-03 ENCOUNTER — Other Ambulatory Visit: Payer: Self-pay | Admitting: Internal Medicine

## 2016-11-03 DIAGNOSIS — N6029 Fibroadenosis of unspecified breast: Secondary | ICD-10-CM

## 2016-11-03 DIAGNOSIS — N951 Menopausal and female climacteric states: Secondary | ICD-10-CM

## 2016-11-05 ENCOUNTER — Other Ambulatory Visit: Payer: Self-pay | Admitting: Internal Medicine

## 2016-11-05 DIAGNOSIS — E2839 Other primary ovarian failure: Secondary | ICD-10-CM

## 2016-11-05 DIAGNOSIS — N6011 Diffuse cystic mastopathy of right breast: Secondary | ICD-10-CM

## 2016-11-05 DIAGNOSIS — N6012 Diffuse cystic mastopathy of left breast: Secondary | ICD-10-CM

## 2016-11-05 DIAGNOSIS — N63 Unspecified lump in unspecified breast: Secondary | ICD-10-CM

## 2016-11-23 ENCOUNTER — Telehealth: Payer: Self-pay | Admitting: Cardiovascular Disease

## 2016-11-23 NOTE — Telephone Encounter (Signed)
New Message     Patient wants to know if she can a topical ointment pennsaid  On her knees?  Will it effect the Eliquis?

## 2016-11-24 NOTE — Telephone Encounter (Signed)
Informed patient that she needs to be cautious when taking oral NSAIDS when taking eliquis. Informed patient that she should be fine taking an OTC topical ointment for her arthritis in her knees, but she should check with her PCP as well.  Patient verbalize understanding.

## 2016-11-29 ENCOUNTER — Other Ambulatory Visit: Payer: Self-pay | Admitting: Internal Medicine

## 2016-11-29 MED ORDER — DICLOFENAC SODIUM 2 % TD SOLN: TRANSDERMAL | 11 refills | Status: DC

## 2016-11-30 ENCOUNTER — Other Ambulatory Visit: Payer: Self-pay | Admitting: Physician Assistant

## 2016-11-30 ENCOUNTER — Other Ambulatory Visit: Payer: Self-pay | Admitting: Internal Medicine

## 2016-11-30 MED ORDER — DICLOFENAC SODIUM 1 % TD GEL: 4.0000 g | Freq: Four times a day (QID) | TRANSDERMAL | 3 refills | Status: DC

## 2016-12-03 ENCOUNTER — Encounter: Payer: Self-pay | Admitting: Internal Medicine

## 2016-12-08 ENCOUNTER — Other Ambulatory Visit: Payer: Self-pay | Admitting: Physician Assistant

## 2016-12-08 MED ORDER — DICLOFENAC SODIUM 1 % TD GEL: 4.0000 g | Freq: Four times a day (QID) | TRANSDERMAL | 3 refills | Status: DC

## 2016-12-28 ENCOUNTER — Ambulatory Visit

## 2016-12-28 ENCOUNTER — Ambulatory Visit
Admission: RE | Admit: 2016-12-28 | Discharge: 2016-12-28 | Disposition: A | Discharge status: Home / Self Care | Source: Ambulatory Visit | Attending: Internal Medicine | Admitting: Internal Medicine

## 2016-12-28 ENCOUNTER — Other Ambulatory Visit: Payer: Self-pay | Admitting: Internal Medicine

## 2016-12-28 DIAGNOSIS — N6012 Diffuse cystic mastopathy of left breast: Principal | ICD-10-CM

## 2016-12-28 DIAGNOSIS — M810 Age-related osteoporosis without current pathological fracture: Secondary | ICD-10-CM

## 2016-12-28 DIAGNOSIS — N63 Unspecified lump in unspecified breast: Secondary | ICD-10-CM

## 2016-12-28 DIAGNOSIS — E2839 Other primary ovarian failure: Secondary | ICD-10-CM

## 2016-12-28 DIAGNOSIS — N6011 Diffuse cystic mastopathy of right breast: Secondary | ICD-10-CM

## 2016-12-28 MED ORDER — ALENDRONATE SODIUM 70 MG PO TABS: ORAL_TABLET | ORAL | 3 refills | Status: DC

## 2017-01-13 NOTE — Progress Notes (Signed)
CARDIOLOGY OFFICE NOTE  Date:  01/17/2017    Claudia [name] Date of Birth: 1950-04-18 Medical Record #[id number]  PCP:  Unk Pinto, MD  Cardiologist:  [name]  No chief complaint on file.   History of Present Illness: Claudia [name] is a 66 y.o. female  She has a history of HTN, obesity, PAF, borderline DM,  on chronic eliquis for CHA2VASC 3. History of bradycardia on low dose beta blocker.   Seems that Celebrex and Tumeric can exacerbate her palpitations. Likely OSA But has not arranged her sleep study    Seen by PA 10/21/16 with lots of somatic complaints. Seen at PCP office yesterday - lots of labs - all ok. She notes that over the past few weeks she has felt shaky and jittery. She notes an irregular and pounding heart beat. Her stools are now loose. She has difficult focusing/concentrating. Frequent urination and heartburn. Occasional tingling in hands,feet, lips, tongue. Weak in general. Trouble getting and staying asleep. Some chest pain off and on - not really with exertion but she does not exert due to "bad knees". She has tried to cut back on sugar and has lost a few pounds. She has not proceeded with her sleep study - does not sound like she is convinced that she needs it.    Event monitor benign PAC;s 10/26/16 Myovue normal EF 68% 10/19/16   Primary did lots blood work including TSH which was normal   Past Medical History:  Diagnosis Date  . Achilles tendonitis    R heal, limits activity  . Allergy   . Anxiety   . Elevated hemoglobin A1c   . Hyperlipidemia   . Hypertension   . IBS (irritable bowel syndrome)   . Insomnia   . Obesity   . Palpitations   . Vitamin D deficiency     Past Surgical History:  Procedure Laterality Date  . CESAREAN SECTION    . CESAREAN SECTION    . CHOLECYSTECTOMY  10 years ago  . TUBAL LIGATION Bilateral      Medications: Current Meds  Medication Sig  . ALPRAZolam (XANAX) 0.25 MG tablet Take 1 tablet (0.25 mg  total) by mouth 3 (three) times daily as needed for anxiety.  Marland Kitchen apixaban (ELIQUIS) 5 MG TABS tablet Take 1 tablet (5 mg total) by mouth 2 (two) times daily.  Marland Kitchen atenolol (TENORMIN) 50 MG tablet Take 0.5 tablets (25 mg total) by mouth daily. May take extra 1/2 tab prn palpitations.  . Cholecalciferol (VITAMIN D-3 PO) Take 8,000 Units by mouth daily.   Marland Kitchen CINNAMON PO Take 1,000 mg by mouth 2 (two) times daily.  . diclofenac sodium (VOLTAREN) 1 % GEL Apply 4 g topically 4 (four) times daily.  . DiphenhydrAMINE HCl (BENADRYL ALLERGY PO) Take by mouth as needed.  . Flaxseed, Linseed, (FLAX SEEDS PO) Take 1,000 mg by mouth 2 (two) times daily.  . hydrochlorothiazide (HYDRODIURIL) 25 MG tablet TAKE 1 TABLET DAILY  . montelukast (SINGULAIR) 10 MG tablet Take 10 mg by mouth daily.  Marland Kitchen PREMPRO 0.625-2.5 MG tablet TAKE AS DIRECTED  . quinapril (ACCUPRIL) 40 MG tablet TAKE 1 TABLET DAILY  . vitamin B-12 (CYANOCOBALAMIN) 1000 MCG tablet Take 1,000 mcg by mouth daily.     Allergies: Allergies  Allergen Reactions  . Ciprofloxacin   . Penicillins Other (See Comments)    Had as a child=unknown reaction  . Zoloft [Sertraline Hcl]     Social History: The patient  reports that she quit  smoking about 12 years ago. She has never used smokeless tobacco. She reports that she drinks alcohol. She reports that she does not use drugs.   Family History: The patient's family history includes Alcohol abuse in her father; Diabetes in her mother; Heart attack in her father and mother; Heart disease in her mother; Macular degeneration in her mother; Ovarian cancer in her maternal grandmother; Stroke in her mother.   Review of Systems: Please see the history of present illness.   Otherwise, the review of systems is positive for none.   All other systems are reviewed and negative.   Physical Exam: VS:  BP 138/80   Pulse 62   Ht 5\' 8"  (1.727 m)   Wt 249 lb 4 oz (113.1 kg)   BMI 37.90 kg/m  .  BMI Body mass index is  37.9 kg/m.  Wt Readings from Last 3 Encounters:  01/17/17 249 lb 4 oz (113.1 kg)  10/25/16 254 lb 12.8 oz (115.6 kg)  10/18/16 252 lb (114.3 kg)   Affect appropriate Healthy:  appears stated age 66: normal Neck supple with no adenopathy JVP normal no bruits no thyromegaly Lungs clear with no wheezing and good diaphragmatic motion Heart:  S1/S2 no murmur, no rub, gallop or click PMI normal Abdomen: benighn, BS positve, no tenderness, no AAA no bruit.  No HSM or HJR Distal pulses intact with no bruits No edema Neuro non-focal Skin warm and dry No muscular weakness    LABORATORY DATA:  EKG:  10/12/16  This demonstrates NSR.   Lab Results  Component Value Date   WBC 7.8 10/11/2016   HGB 13.9 10/11/2016   HCT 41.9 10/11/2016   PLT 337 10/11/2016   GLUCOSE 106 (H) 10/11/2016   CHOL 153 08/17/2016   TRIG 297 (H) 08/17/2016   HDL 40 (L) 08/17/2016   LDLCALC 54 08/17/2016   ALT 12 10/11/2016   AST 16 10/11/2016   NA 138 10/11/2016   K 3.8 10/11/2016   CL 102 10/11/2016   CREATININE 0.87 10/11/2016   BUN 15 10/11/2016   CO2 22 10/11/2016   TSH 0.73 10/11/2016   HGBA1C 5.5 10/25/2016   MICROALBUR 0.3 11/04/2015     BNP (last 3 results) No results for input(s): BNP in the last 8760 hours.  ProBNP (last 3 results) No results for input(s): PROBNP in the last 8760 hours.   Other Studies Reviewed Today:  Echo Study Conclusions from 2015  - Left ventricle: The cavity size was normal. Wall thickness was increased in a pattern of mild LVH. Systolic function was normal. The estimated ejection fraction was in the range of 60% to 65%. Wall motion was normal; there were no regional wall motion abnormalities. Features are consistent with a pseudonormal left ventricular filling pattern, with concomitant abnormal relaxation and increased filling pressure (grade 2 diastolic dysfunction). - Aortic valve: There was no stenosis. - Aorta: Ascending  aortic diameter: 89mm (S). - Ascending aorta: The ascending aorta was mildly dilated. - Mitral valve: No significant regurgitation. - Left atrium: The atrium was mildly dilated. - Right ventricle: The cavity size was normal. Systolic function was normal. - Right atrium: The atrium was mildly dilated. - Tricuspid valve: Peak RV-RA gradient: 18mm Hg (S). - Pulmonary arteries: PA peak pressure: 60mm Hg (S). - Inferior vena cava: The vessel was normal in size; the respirophasic diameter changes were in the normal range (= 50%); findings are consistent with normal central venous pressure. Impressions:  - Normal LV size and systolic  function, EF 60-65%. Mild LV hypertrophy. Moderate diastolic dysfunction. Normal RV size and systolic function. No significant valvular abnormalities. Mildly dilated ascending aorta.   CTA CHEST MPRESSION 04/2016: Ascending thoracic aortic diameter measures 4.2 x 4.0 cm. Recommend annual imaging followup by CTA or MRA. This recommendation follows 2010 ACCF/AHA/AATS/ACR/ASA/SCA/SCAI/SIR/STS/SVM Guidelines for the Diagnosis and Management of Patients with Thoracic Aortic Disease. Circulation. 2010; 121: Z308-M578. No thoracic aortic dissection.  No edema or consolidation. 3 mm nodular opacity right upper lobe anteriorly. No follow-up needed if patient is low-risk. Non-contrast chest CT can be considered in 12 months if patient is high-risk. This recommendation follows the consensus statement: Guidelines for Management of Incidental Pulmonary Nodules Detected on CT Images: From the Fleischner Society 2017; Radiology 2017; 284:228-243.   Assessment/Plan:  1. Multitude of somatic complaints - none seem related to heart f/u with primary   2. Palpitations most recent event monitor 10/26/16 only benign PAC;s   3. Obesity - encouraged weight loss in the past - she does not exercise. Explained this is a risk factor for her AF.   4. History of  bradycardia - stable on 1/2 dose atenolol. She does use extra prn.   5. Snoring - She has declinedsleep study in the past. Declined again today despite this being a risk factor for AF.   6. Dilated aorta - recent CTA  4.2 x 4.0 cm - will need recheck in one year (04/2017). Needs good BP control.      Jenkins Rouge

## 2017-01-17 ENCOUNTER — Encounter: Payer: Self-pay | Admitting: Cardiovascular Disease

## 2017-01-17 ENCOUNTER — Ambulatory Visit (INDEPENDENT_AMBULATORY_CARE_PROVIDER_SITE_OTHER): Admitting: Cardiovascular Disease

## 2017-01-17 ENCOUNTER — Other Ambulatory Visit: Payer: Self-pay | Admitting: *Deleted

## 2017-01-17 VITALS — BP 138/80 | HR 62 | Ht 68.0 in | Wt 249.2 lb

## 2017-01-17 DIAGNOSIS — I712 Thoracic aortic aneurysm, without rupture, unspecified: Secondary | ICD-10-CM

## 2017-01-17 DIAGNOSIS — I480 Paroxysmal atrial fibrillation: Secondary | ICD-10-CM | POA: Diagnosis not present

## 2017-01-17 NOTE — Patient Instructions (Addendum)
Medication Instructions:  Your physician recommends that you continue on your current medications as directed. Please refer to the Current Medication list given to you today.  Labwork: NONE  Testing/Procedures: Cardiac CT Angiography (CTA), is a special type of CT scan that uses a computer to produce multi-dimensional views of major blood vessels throughout the body. In CT angiography, a contrast material is injected through an IV to help visualize the blood vessels  Follow-Up: Your physician wants you to follow-up in: 12 months with Dr. [name]. You will receive a reminder letter in the mail two months in advance. If you don't receive a letter, please call our office to schedule the follow-up appointment.   If you need a refill on your cardiac medications before your next appointment, please call your pharmacy.

## 2017-02-07 ENCOUNTER — Other Ambulatory Visit: Payer: Self-pay

## 2017-02-07 MED ORDER — ATENOLOL 50 MG PO TABS: 25.0000 mg | ORAL_TABLET | Freq: Every day | ORAL | 3 refills | Status: DC

## 2017-02-14 ENCOUNTER — Other Ambulatory Visit: Payer: Self-pay | Admitting: Internal Medicine

## 2017-02-15 NOTE — Progress Notes (Signed)
Assessment and Plan:   Essential hypertension - continue medications, DASH diet, exercise and monitor at home. Call if greater than 130/80.   SVT (supraventricular tachycardia) (HCC) Continue meds, continue cardio follow up, no symptoms at this time.   Paroxysmal atrial fibrillation (HCC) Continue meds, continue cardio follow up, no symptoms at this time.   Thoracic aortic aneurysm without rupture (HCC) Control blood pressure, cholesterol, glucose, increase exercise.   Severe obesity (BMI >= 40) (HCC) - follow up 3 months for progress monitoring - increase veggies, decrease carbs - long discussion about weight loss, diet, and exercise  Mixed hyperlipidemia -continue medications, check lipids, decrease fatty foods, increase activity.   Will get lab in Feb Continue diet and meds as discussed. Further disposition pending results of labs. Future Appointments  Date Time Provider [place]  04/07/2017 10:30 AM [facility]  04/11/2017 10:30 AM [facility]  05/10/2017 11:00 AM Burtis Junes, NP [facility]  05/25/2017 10:30 AM Unk Pinto, MD [facility] None  11/09/2017  3:00 PM Unk Pinto, MD [facility] None    HPI 66 y.o. female  presents for 3 month follow up with hypertension, hyperlipidemia, prediabetes and vitamin D.   Her blood pressure has been controlled at home, today their BP is BP: 128/86.   She does not workout. She denies chest pain, shortness of breath, dizziness. She has been following with [facility], has Afib, has Thoracic aortic anuerysm, on elliquis.    She is NOT on cholesterol medication and denies myalgias. Her cholesterol is at goal. The cholesterol last visit was:   Lab Results  Component Value Date   CHOL 153 08/17/2016   HDL 40 (L) 08/17/2016   LDLCALC 54 08/17/2016   TRIG 297 (H) 08/17/2016   CHOLHDL 3.8 08/17/2016    She has been working on diet and exercise for  prediabetes, and denies foot ulcerations, hyperglycemia, hypoglycemia , increased appetite, nausea, paresthesia of the feet, polydipsia, polyuria, visual disturbances, vomiting and weight loss. Last A1C in the office was:  Lab Results  Component Value Date   HGBA1C 5.5 10/25/2016   Patient is on Vitamin D supplement.  Lab Results  Component Value Date   VD25OH 65 10/25/2016     BMI is Body mass index is 37.86 kg/m., she is working on diet and exercise. Snores but no morning fatigue, no witnessed apnea, weight loss advised Wt Readings from Last 3 Encounters:  02/16/17 249 lb (112.9 kg)  01/17/17 249 lb 4 oz (113.1 kg)  10/25/16 254 lb 12.8 oz (115.6 kg)     Current Medications:  Current Outpatient Medications on File Prior to Visit  Medication Sig Dispense Refill  . ALPRAZolam (XANAX) 0.25 MG tablet Take 1 tablet (0.25 mg total) by mouth 3 (three) times daily as needed for anxiety. 30 tablet 0  . apixaban (ELIQUIS) 5 MG TABS tablet Take 1 tablet (5 mg total) by mouth 2 (two) times daily. 180 tablet 3  . atenolol (TENORMIN) 50 MG tablet Take 0.5 tablets (25 mg total) daily by mouth. May take extra 1/2 tab prn palpitations. 135 tablet 3  . Cholecalciferol (VITAMIN D-3 PO) Take 8,000 Units by mouth daily.     Marland Kitchen CINNAMON PO Take 1,000 mg by mouth 2 (two) times daily.    . diclofenac sodium (VOLTAREN) 1 % GEL Apply 4 g topically 4 (four) times daily. 100 g 3  . DiphenhydrAMINE HCl (BENADRYL ALLERGY PO) Take by mouth as needed.    Marland Kitchen  Flaxseed, Linseed, (FLAX SEEDS PO) Take 1,000 mg by mouth 2 (two) times daily.    . hydrochlorothiazide (HYDRODIURIL) 25 MG tablet TAKE 1 TABLET DAILY 90 tablet 1  . montelukast (SINGULAIR) 10 MG tablet TAKE 1 TABLET DAILY 90 tablet 3  . PREMPRO 0.625-2.5 MG tablet TAKE AS DIRECTED 84 tablet 99  . quinapril (ACCUPRIL) 40 MG tablet TAKE 1 TABLET DAILY 90 tablet 2  . vitamin B-12 (CYANOCOBALAMIN) 1000 MCG tablet Take 1,000 mcg by mouth daily.     No current  facility-administered medications on file prior to visit.     Medical History:  Past Medical History:  Diagnosis Date  . Achilles tendonitis    R heal, limits activity  . Allergy   . Anxiety   . Elevated hemoglobin A1c   . Hyperlipidemia   . Hypertension   . IBS (irritable bowel syndrome)   . Insomnia   . Obesity   . Palpitations   . Vitamin D deficiency     Allergies:  Allergies  Allergen Reactions  . Ciprofloxacin   . Penicillins Other (See Comments)    Had as a child=unknown reaction  . Zoloft [Sertraline Hcl]      Review of Systems:  Review of Systems  Constitutional: Negative for chills, fever and malaise/fatigue.  HENT: Negative for congestion, ear pain and sore throat.   Eyes: Negative.   Respiratory: Negative for cough, shortness of breath and wheezing.   Cardiovascular: Negative for chest pain, palpitations and leg swelling.  Gastrointestinal: Negative for abdominal pain, blood in stool, constipation, diarrhea, heartburn and melena.  Genitourinary: Negative.   Musculoskeletal: Negative for joint pain.  Skin: Negative.   Neurological: Negative for dizziness, sensory change, loss of consciousness and headaches.  Psychiatric/Behavioral: Negative for depression. The patient is not nervous/anxious and does not have insomnia.     Family history- Review and unchanged  Social history- Review and unchanged  Physical Exam: BP 128/86   Pulse 68   Ht 5\' 8"  (1.727 m)   Wt 249 lb (112.9 kg)   SpO2 95%   BMI 37.86 kg/m  Wt Readings from Last 3 Encounters:  02/16/17 249 lb (112.9 kg)  01/17/17 249 lb 4 oz (113.1 kg)  10/25/16 254 lb 12.8 oz (115.6 kg)    General Appearance: Well nourished well developed, in no apparent distress. Eyes: PERRLA, EOMs, conjunctiva no swelling or erythema ENT/Mouth: Ear canals normal without obstruction, swelling, erythma, discharge.  TMs normal bilaterally.  Oropharynx moist, clear, without exudate, or postoropharyngeal  swelling. Neck: Supple, thyroid normal,no cervical adenopathy  Respiratory: Respiratory effort normal, Breath sounds clear A&P without rhonchi, wheeze, or rale.  No retractions, no accessory usage. Cardio: RRR with no MRGs. Brisk peripheral pulses without edema.  Abdomen: Soft, + BS, obese,  Non tender, no guarding, rebound, hernias, masses. Musculoskeletal: Full ROM, 5/5 strength.  Skin: Warm, dry without rashes, lesions, ecchymosis.  Neuro: Awake and oriented X 3, Cranial nerves intact. Normal muscle tone, no cerebellar symptoms. Psych: Normal affect, Insight and Judgment appropriate.    Vicie Mutters, PA-C 10:03 AM [facility]

## 2017-02-16 ENCOUNTER — Encounter: Payer: Self-pay | Admitting: Physician Assistant

## 2017-02-16 ENCOUNTER — Ambulatory Visit (INDEPENDENT_AMBULATORY_CARE_PROVIDER_SITE_OTHER): Admitting: Physician Assistant

## 2017-02-16 VITALS — BP 128/86 | HR 68 | Ht 68.0 in | Wt 249.0 lb

## 2017-02-16 DIAGNOSIS — I480 Paroxysmal atrial fibrillation: Secondary | ICD-10-CM

## 2017-02-16 DIAGNOSIS — Z79899 Other long term (current) drug therapy: Secondary | ICD-10-CM

## 2017-02-16 DIAGNOSIS — I471 Supraventricular tachycardia: Secondary | ICD-10-CM

## 2017-02-16 DIAGNOSIS — I712 Thoracic aortic aneurysm, without rupture, unspecified: Secondary | ICD-10-CM

## 2017-02-16 DIAGNOSIS — I10 Essential (primary) hypertension: Secondary | ICD-10-CM

## 2017-02-16 DIAGNOSIS — E782 Mixed hyperlipidemia: Secondary | ICD-10-CM

## 2017-02-16 NOTE — Patient Instructions (Addendum)
Drink 80-100 oz a day of water, measure it out Eat 3 meals a day, have to do breakfast, eat protein- hard boiled eggs, protein bar like nature valley protein bar, greek yogurt like oikos triple zero, chobani 100, or light n fit greek Please increase your fluids, this can cause your kidney function to be decreased. Being dehydrated can also cause fatigue, headaches, muscle aches, joint pain, and dry skin/nails so please increase your fluids.      Bad carbs also include fruit juice, alcohol, and sweet tea. These are empty calories that do not signal to your brain that you are full.   Please remember the good carbs are still carbs which convert into sugar. So please measure them out no more than 1/2-1 cup of rice, oatmeal, pasta, and beans  Veggies are however free foods! Pile them on.   Not all fruit is created equal. Please see the list below, the fruit at the bottom is higher in sugars than the fruit at the top. Please avoid all dried fruits.     Simple math prevails.    1st - exercise does not produce significant weight loss - at best one converts fat into muscle , "bulks up", loses inches, but usually stays "weight neutral"     2nd - think of your body weightas a check book: If you eat more calories than you burn up - you save money or gain weight .... Or if you spend more money than you put in the check book, ie burn up more calories than you eat, then you lose weight     3rd - if you walk or run 1 mile, you burn up 100 calories - you have to burn up 3,500 calories to lose 1 pound, ie you have to walk/run 35 miles to lose 1 measly pound. So if you want to lose 10 #, then you have to walk/run 350 miles, so.... clearly exercise is not the solution.     4. So if you consume 1,500 calories, then you have to burn up the equivalent of 15 miles to stay weight neutral - It also stands to reason that if you consume 1,500 cal/day and don't lose weight, then you must be burning up about 1,500  cals/day to stay weight neutral.     5. If you really want to lose weight, you must cut your calorie intake 300 calories /day and at that rate you should lose about 1 # every 3 days.   6. Please purchase Dr Fara Olden Fuhrman's book(s) "The End of Dieting" & "Eat to Live" . It has some great concepts and recipes.

## 2017-02-17 ENCOUNTER — Ambulatory Visit: Payer: Self-pay | Admitting: Physician Assistant

## 2017-02-20 ENCOUNTER — Other Ambulatory Visit: Payer: Self-pay | Admitting: Nurse Practitioner

## 2017-02-21 NOTE — Telephone Encounter (Signed)
Eliquis 5mg  refill received; pt is 66 yrs old, wt-112.9kg, Crea-0.87 on 10/11/16, last seen by Dr. [name] on 01/17/17; refill request sent per request.

## 2017-03-03 IMAGING — CT CT ANGIO CHEST
2 of 7 series · 18 of 36 positions shown · IV contrast (isovue)
Comparison: None.

CLINICAL DATA: No adenopathy evident.

EXAM:
CT ANGIOGRAPHY CHEST WITH CONTRAST
TECHNIQUE: Multidetector CT imaging of the chest was performed using the
standard protocol during bolus administration of intravenous
contrast. Multiplanar CT image reconstructions and MIPs were
obtained to evaluate the vascular anatomy.
CONTRAST:  100 mL Isovue 370 nonionic

[Series 4: aorta 3.0 i31f 2 · axial · 0.77mm/px · z∈[-335,-62]mm · 17 of 101 slices shown]
[im 5/101  lung]
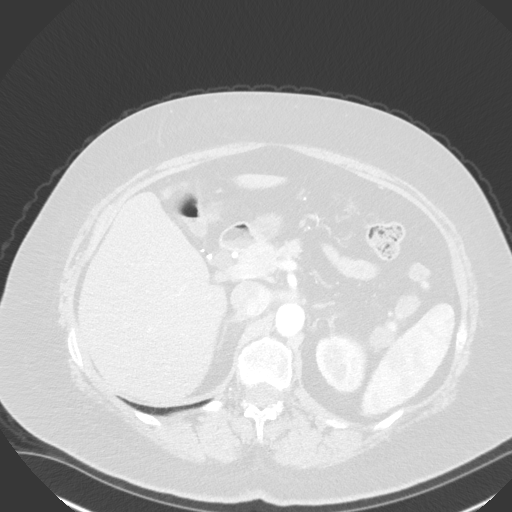
[im 9/101  mediastinal]
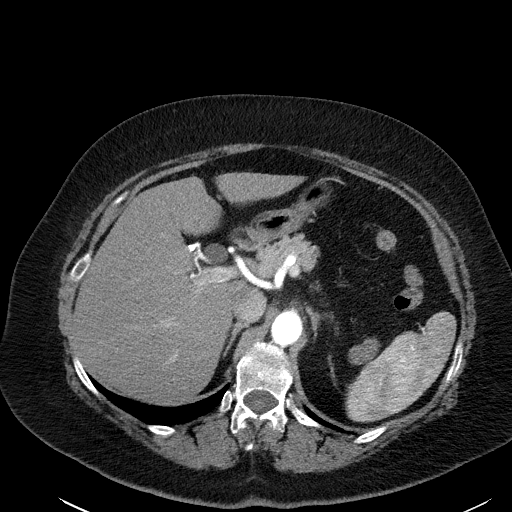
[im 18/101  lung]
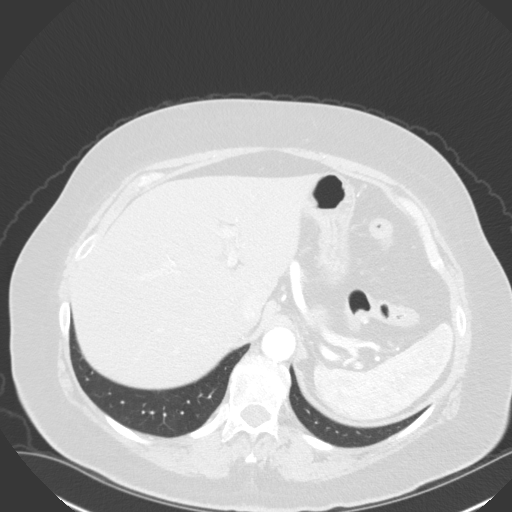
[im 22/101  mediastinal]
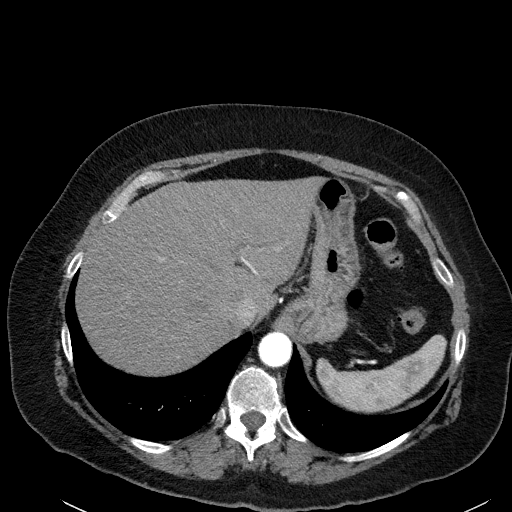
[im 27/101  lung]
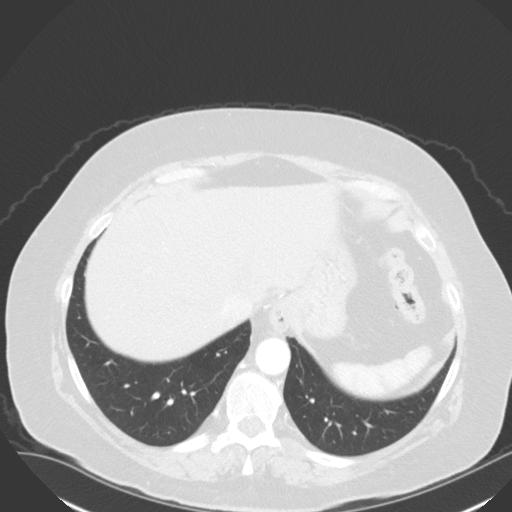
[im 35/101  mediastinal]
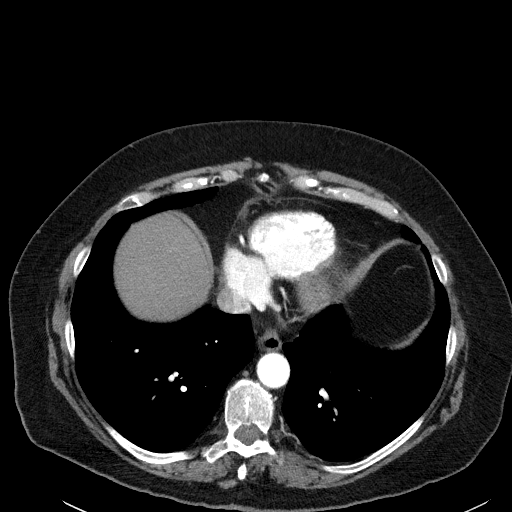
[im 40/101  lung]
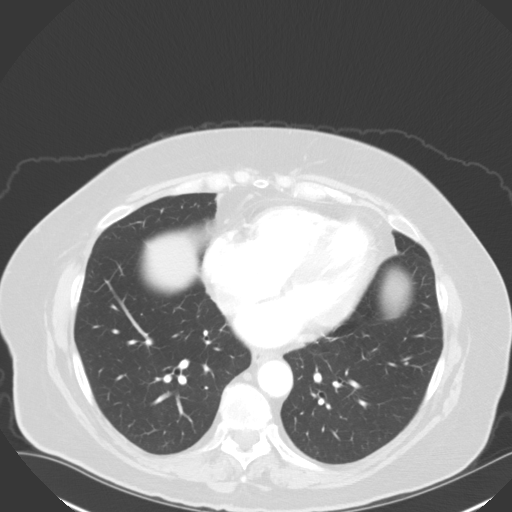
[im 44/101  mediastinal]
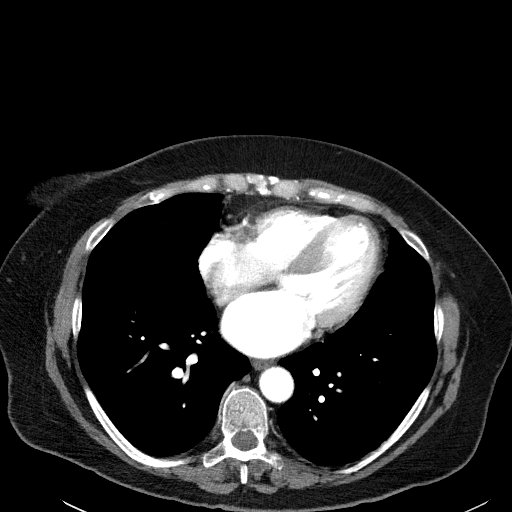
[im 53/101  lung]
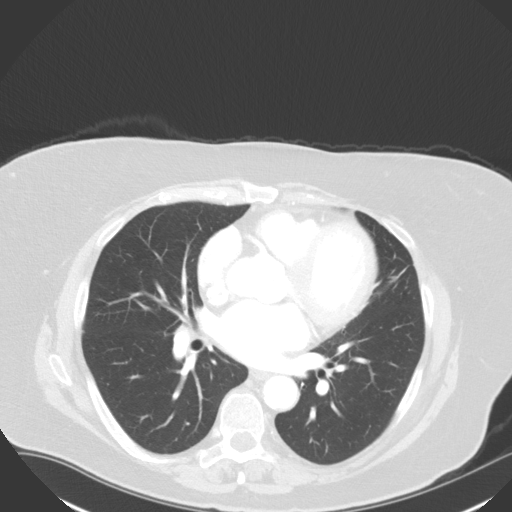
[im 57/101  mediastinal]
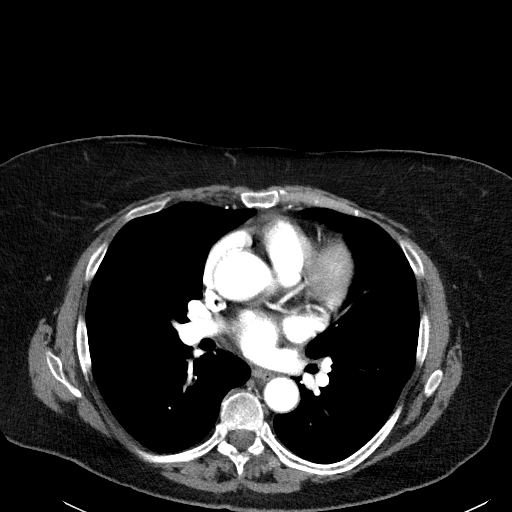
[im 61/101  lung]
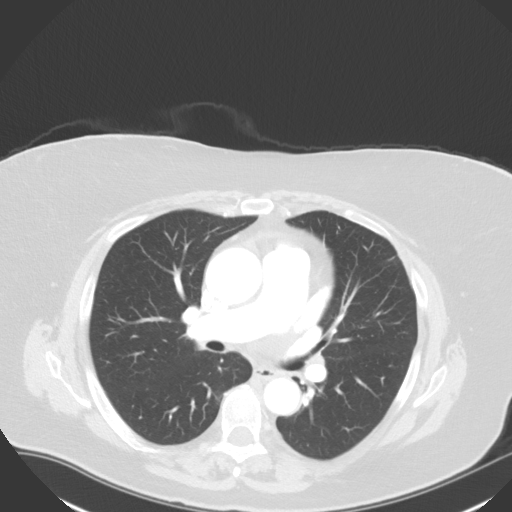
[im 66/101  mediastinal]
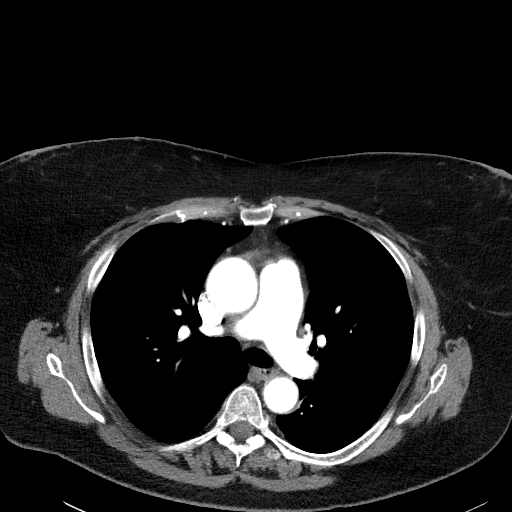
[im 74/101  lung]
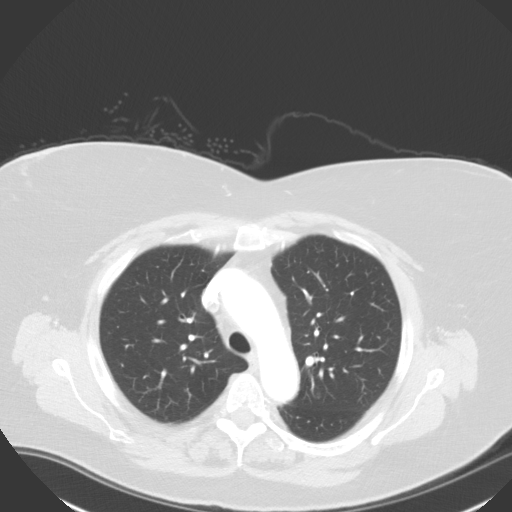
[im 79/101  mediastinal]
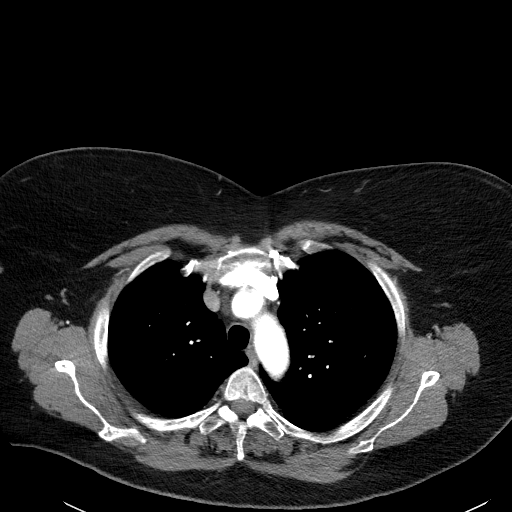
[im 83/101  lung]
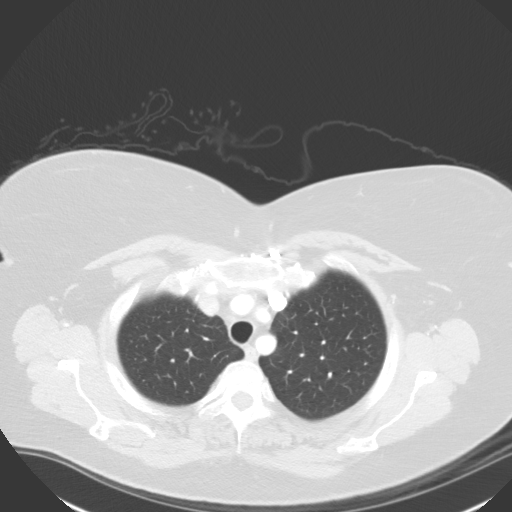
[im 92/101  mediastinal]
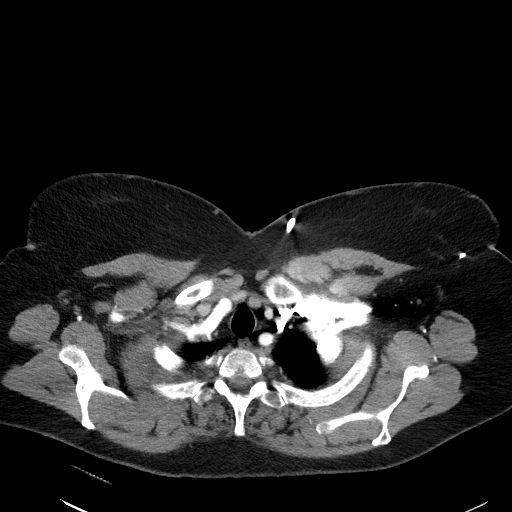
[im 96/101  lung]
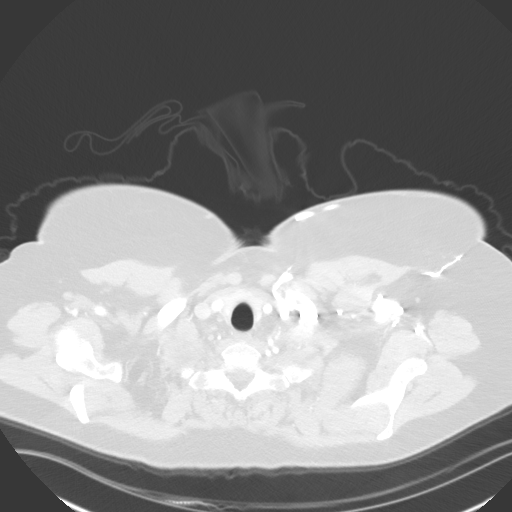

[Series 7: coronals · coronal · 0.58mm/px · 1 of 138 slices shown]
[im 69/138  mediastinal]
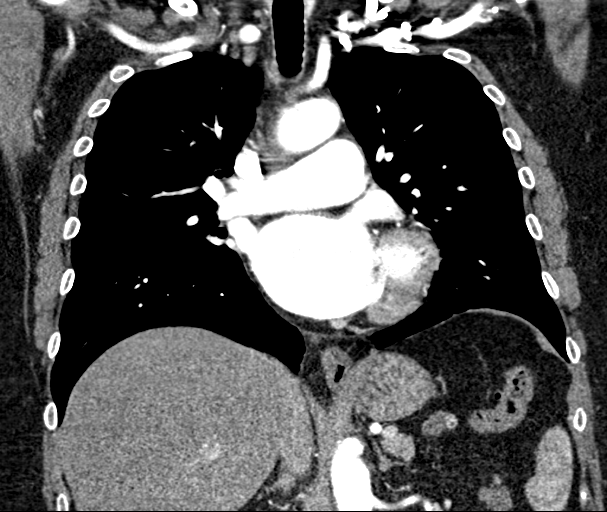

[18 of 36 positions shown; findings below may reference images not displayed]

FINDINGS: Cardiovascular: The ascending thoracic aortic diameter measures
x 4.0 cm, measured on axial slice 39 series 4. There is no thoracic
aortic dissection. The visualized great vessels appear normal. Note
that the right and left common carotid arteries arise as a common
trunk, an anatomic variant. Pericardium is not appreciably
thickened. There is no major vessel pulmonary embolus evident. The
main pulmonary outflow tract measures 3.3 cm in diameter.

Mediastinum/Nodes: Visualized thyroid appears normal. There is no
appreciable thoracic adenopathy.

Lungs/Pleura: On axial slice 47 series 5, there is a 3 mm nodular
opacity in the inferior aspect of the anterior segment of the right
upper lobe. There is mild scarring in the lingula. There is no edema
or consolidation. There is no appreciable pleural effusion or
pleural thickening.

Upper Abdomen: In the visualized upper abdomen, gallbladder is
absent. There is a degree of reflux of contrast into the inferior
vena cava appear Visualized upper abdominal structures otherwise
appear unremarkable.

Musculoskeletal: There is degenerative change in the thoracic spine.
There are no blastic or lytic bone lesions.

Review of the MIP images confirms the above findings.
IMPRESSION: Ascending thoracic aortic diameter measures 4.2 x 4.0 cm. Recommend
annual imaging followup by CTA or MRA. This recommendation follows
1161 ACCF/AHA/AATS/ACR/ASA/SCA/LIBURD/HOSUNG/ELBA/DEMETRIUS Guidelines for the
Diagnosis and Management of Patients with Thoracic Aortic Disease.
Circulation. 1161; 121: e266-e369. No thoracic aortic dissection.

No edema or consolidation. 3 mm nodular opacity right upper lobe
anteriorly. No follow-up needed if patient is low-risk. Non-contrast
chest CT can be considered in 12 months if patient is high-risk.
This recommendation follows the consensus statement: Guidelines for
Management of Incidental Pulmonary Nodules Detected on CT Images:

No demonstrable adenopathy. Reflux of contrast into the inferior
vena cava may indicate a degree of increased right heart pressure.

## 2017-04-07 ENCOUNTER — Other Ambulatory Visit: Admitting: *Deleted

## 2017-04-07 ENCOUNTER — Other Ambulatory Visit: Payer: Self-pay

## 2017-04-07 DIAGNOSIS — I712 Thoracic aortic aneurysm, without rupture, unspecified: Secondary | ICD-10-CM

## 2017-04-07 LAB — BASIC METABOLIC PANEL
BUN / CREAT RATIO: 16 (ref 12–28)
BUN: 14 mg/dL (ref 8–27)
CO2: 23 mmol/L (ref 20–29)
Calcium: 9.5 mg/dL (ref 8.7–10.3)
Chloride: 100 mmol/L (ref 96–106)
Creatinine, Ser: 0.86 mg/dL (ref 0.57–1.00)
GFR, EST AFRICAN AMERICAN: 83 mL/min/{1.73_m2} (ref >59)
GFR, EST NON AFRICAN AMERICAN: 72 mL/min/{1.73_m2} (ref >59)
Glucose: 103 mg/dL — ABNORMAL HIGH (ref 65–99)
POTASSIUM: 4 mmol/L (ref 3.5–5.2)
Sodium: 138 mmol/L (ref 134–144)

## 2017-04-11 ENCOUNTER — Ambulatory Visit (INDEPENDENT_AMBULATORY_CARE_PROVIDER_SITE_OTHER)
Admission: RE | Admit: 2017-04-11 | Discharge: 2017-04-11 | Disposition: A | Discharge status: Home / Self Care | Payer: PRIVATE HEALTH INSURANCE | Source: Ambulatory Visit | Attending: Nurse Practitioner | Admitting: Nurse Practitioner

## 2017-04-11 DIAGNOSIS — I712 Thoracic aortic aneurysm, without rupture, unspecified: Secondary | ICD-10-CM

## 2017-04-11 MED ORDER — IOPAMIDOL (ISOVUE-370) INJECTION 76%: 100.0000 mL | Freq: Once | INTRAVENOUS | Status: DC | PRN

## 2017-05-10 ENCOUNTER — Encounter: Payer: Self-pay | Admitting: Nurse Practitioner

## 2017-05-10 ENCOUNTER — Ambulatory Visit (INDEPENDENT_AMBULATORY_CARE_PROVIDER_SITE_OTHER): Admitting: Nurse Practitioner

## 2017-05-10 VITALS — BP 160/80 | HR 55 | Ht 68.0 in | Wt 250.1 lb

## 2017-05-10 DIAGNOSIS — I10 Essential (primary) hypertension: Secondary | ICD-10-CM

## 2017-05-10 DIAGNOSIS — R0789 Other chest pain: Secondary | ICD-10-CM | POA: Diagnosis not present

## 2017-05-10 DIAGNOSIS — I480 Paroxysmal atrial fibrillation: Secondary | ICD-10-CM

## 2017-05-10 DIAGNOSIS — Z7901 Long term (current) use of anticoagulants: Secondary | ICD-10-CM

## 2017-05-10 DIAGNOSIS — I7789 Other specified disorders of arteries and arterioles: Secondary | ICD-10-CM

## 2017-05-10 NOTE — Progress Notes (Signed)
CARDIOLOGY OFFICE NOTE  Date:  05/10/2017    Claudia [name] Date of Birth: [birth year] Medical Record #[id number]  PCP:  Unk Pinto, MD  Cardiologist:  Gillian Shields  Chief Complaint  Patient presents with  . Hypertension  . Atrial Fibrillation    4 month check - seen for Dr. [name]    History of Present Illness: Claudia [name] is a 67 y.o. female who presents today for a 4 month check. Seen for Dr. [name].   She has a history of HTN, obesity, PAF, borderline DM,  on chronic eliquis for CHA2VASC 3. History of bradycardia on low dose beta blocker.   Seems that Celebrex and Tumeric can exacerbate her palpitations. Likely has OSA but has not arranged her sleep study.   I last saw her in July - had lots of complaints. lexiscan and event monitor arranged.  She was not convinced that she needed a sleep study. Her event monitor showed benign PACs and Myoview was normal. Her labs from her PCP were normal.   She saw Dr. [name] back in October - seemed to be doing better.   Comes in today. Here alone. She is doing well. No real issues. Admits she has "been eating crap". BP is a little higher here initially. She is trying to get back on track. Seeing PCP later this month and plans to have lab there. No problems with her Eliquis. No bleeding/bruising. No real palpitations. No chest pain and not short of breath. Her scan last month was stable. Precautions discussed.   Aortic valve is trileaflet.   Past Medical History:  Diagnosis Date  . Achilles tendonitis    R heal, limits activity  . Allergy   . Anxiety   . Elevated hemoglobin A1c   . Hyperlipidemia   . Hypertension   . IBS (irritable bowel syndrome)   . Insomnia   . Obesity   . Palpitations   . Vitamin D deficiency     Past Surgical History:  Procedure Laterality Date  . CESAREAN SECTION    . CESAREAN SECTION    . CHOLECYSTECTOMY  10 years ago  . TUBAL LIGATION Bilateral      Medications: Current  Meds  Medication Sig  . ALPRAZolam (XANAX) 0.25 MG tablet Take 1 tablet (0.25 mg total) by mouth 3 (three) times daily as needed for anxiety.  Marland Kitchen atenolol (TENORMIN) 50 MG tablet Take 0.5 tablets (25 mg total) daily by mouth. May take extra 1/2 tab prn palpitations.  . Cholecalciferol (VITAMIN D-3 PO) Take 8,000 Units by mouth daily.   Marland Kitchen CINNAMON PO Take 1,000 mg by mouth 2 (two) times daily.  . diclofenac sodium (VOLTAREN) 1 % GEL Apply 4 g topically 4 (four) times daily.  . DiphenhydrAMINE HCl (BENADRYL ALLERGY PO) Take by mouth as needed.  Marland Kitchen ELIQUIS 5 MG TABS tablet TAKE 1 TABLET TWICE A DAY  . Flaxseed, Linseed, (FLAX SEEDS PO) Take 1,000 mg by mouth 2 (two) times daily.  . hydrochlorothiazide (HYDRODIURIL) 25 MG tablet TAKE 1 TABLET DAILY  . montelukast (SINGULAIR) 10 MG tablet TAKE 1 TABLET DAILY  . PREMPRO 0.625-2.5 MG tablet TAKE AS DIRECTED  . quinapril (ACCUPRIL) 40 MG tablet TAKE 1 TABLET DAILY  . vitamin B-12 (CYANOCOBALAMIN) 1000 MCG tablet Take 1,000 mcg by mouth daily.     Allergies: Allergies  Allergen Reactions  . Ciprofloxacin   . Penicillins Other (See Comments)    Had as a child=unknown reaction  . Zoloft [Sertraline  Hcl]     Social History: The patient  reports that she quit smoking about 13 years ago. she has never used smokeless tobacco. She reports that she drinks alcohol. She reports that she does not use drugs.   Family History: The patient's family history includes Alcohol abuse in her father; Diabetes in her mother; Heart attack in her father and mother; Heart disease in her mother; Macular degeneration in her mother; Ovarian cancer in her maternal grandmother; Stroke in her mother.   Review of Systems: Please see the history of present illness.   Otherwise, the review of systems is positive for none.   All other systems are reviewed and negative.   Physical Exam: VS:  BP (!) 160/80 (BP Location: Left Arm, Patient Position: Sitting, Cuff Size: Large)    Pulse (!) 55   Ht 5\' 8"  (1.727 m)   Wt 250 lb 1.9 oz (113.5 kg)   BMI 38.03 kg/m  .  BMI Body mass index is 38.03 kg/m.  Wt Readings from Last 3 Encounters:  05/10/17 250 lb 1.9 oz (113.5 kg)  02/16/17 249 lb (112.9 kg)  01/17/17 249 lb 4 oz (113.1 kg)   Repeat BP by me is 130/80  General: Pleasant. Well developed, well nourished and in no acute distress. She remains obese.   HEENT: Normal.  Neck: Supple, no JVD, carotid bruits, or masses noted.  Cardiac: Regular rate and rhythm. No murmurs, rubs, or gallops. No edema.  Respiratory:  Lungs are clear to auscultation bilaterally with normal work of breathing.  GI: Soft and nontender.  MS: No deformity or atrophy. Gait and ROM intact.  Skin: Warm and dry. Color is normal.  Neuro:  Strength and sensation are intact and no gross focal deficits noted.  Psych: Alert, appropriate and with normal affect.   LABORATORY DATA:  EKG:  EKG is ordered today. This demonstrates sinus brady.  Lab Results  Component Value Date   WBC 7.8 10/11/2016   HGB 13.9 10/11/2016   HCT 41.9 10/11/2016   PLT 337 10/11/2016   GLUCOSE 103 (H) 04/07/2017   CHOL 153 08/17/2016   TRIG 297 (H) 08/17/2016   HDL 40 (L) 08/17/2016   LDLCALC 54 08/17/2016   ALT 12 10/11/2016   AST 16 10/11/2016   NA 138 04/07/2017   K 4.0 04/07/2017   CL 100 04/07/2017   CREATININE 0.86 04/07/2017   BUN 14 04/07/2017   CO2 23 04/07/2017   TSH 0.73 10/11/2016   HGBA1C 5.5 10/25/2016   MICROALBUR 0.3 11/04/2015     BNP (last 3 results) No results for input(s): BNP in the last 8760 hours.  ProBNP (last 3 results) No results for input(s): PROBNP in the last 8760 hours.   Other Studies Reviewed Today:  CT CHEST IMPRESSION 04/2017: Stable 4.2 x 4 cm ascending thoracic aortic aneurysm. No associated complication. Recommend annual imaging followup by CTA or MRA. This recommendation follows 2010 ACCF/AHA/AATS/ACR/ASA/SCA/SCAI/SIR/STS/SVM Guidelines for the Diagnosis  and Management of Patients with Thoracic Aortic Disease. Circulation. 2010; 121: Y606-T016.   Myoview Study Highlights 10/2016    The left ventricular ejection fraction is hyperdynamic (>65%).  Nuclear stress EF: 68%. Overall normal wall motion  There was no ST segment deviation noted during stress.  Defect 1: There is a small defect present in the apex location. This may be representative a small apical infarct or perhaps represents artifact.  This is a low risk study.   Candee Furbish, MD    Event Monitor Study  Highlights 10/2016  Sinus Rhythm  PAC;s Symptoms fluttering do not always correlate with arrhythmia     Echo Study Conclusions from 2015  - Left ventricle: The cavity size was normal. Wall thickness was increased in a pattern of mild LVH. Systolic function was normal. The estimated ejection fraction was in the range of 60% to 65%. Wall motion was normal; there were no regional wall motion abnormalities. Features are consistent with a pseudonormal left ventricular filling pattern, with concomitant abnormal relaxation and increased filling pressure (grade 2 diastolic dysfunction). - Aortic valve: There was no stenosis. - Aorta: Ascending aortic diameter: 60mm (S). - Ascending aorta: The ascending aorta was mildly dilated. - Mitral valve: No significant regurgitation. - Left atrium: The atrium was mildly dilated. - Right ventricle: The cavity size was normal. Systolic function was normal. - Right atrium: The atrium was mildly dilated. - Tricuspid valve: Peak RV-RA gradient: 66mm Hg (S). - Pulmonary arteries: PA peak pressure: 33mm Hg (S). - Inferior vena cava: The vessel was normal in size; the respirophasic diameter changes were in the normal range (= 50%); findings are consistent with normal central venous pressure. Impressions:  - Normal LV size and systolic function, EF 82-95%. Mild LV hypertrophy. Moderate diastolic  dysfunction. Normal RV size and systolic function. No significant valvular abnormalities. Mildly dilated ascending aorta.     Assessment/Plan:  1. PAF - maintaining NSR.   2. High risk medicine - planning on labs with her PCP  3. Obesity - discussed at length.   4. Snoring - probably has OSA - has not wished to have sleep study.   5. History of bradycardia - stable on 1/2 dose atenolol. Asymptomtaic. She does use extra prn.   6. Dilated aorta - unchanged at 4.2 x 4 - will continue with annual surveillance - good BP control - avoid quinolones. Precautions given.   Current medicines are reviewed with the patient today.  The patient does not have concerns regarding medicines other than what has been noted above.  The following changes have been made:  See above.  Labs/ tests ordered today include:    Orders Placed This Encounter  Procedures  . EKG 12-Lead     Disposition:   FU with Dr. [name] in 6 months. Consider updating echo on return.   Patient is agreeable to this plan and will call if any problems develop in the interim.   SignedTruitt Merle, NP  05/10/2017 11:42 AM  [facility] [address] [place], [place]  [address] Phone: [phone] Fax: [phone]

## 2017-05-10 NOTE — Patient Instructions (Addendum)
We will be checking the following labs today - NONE  Ask your PCP about checking your blood count at next visit for [place].    Medication Instructions:    Continue with your current medicines.     Testing/Procedures To Be Arranged:  N/A  Follow-Up:   See Dr. [name] in 6 months    Other Special Instructions:   Keep a check on your BP  Get back on track    If you need a refill on your cardiac medications before your next appointment, please call your pharmacy.                               Ascending Aortic Aneurysm/ Thoracic Aortic Aneurysm   Recent studies have raised concern that fluoroquinolone antibiotics could be associated with an increased risk of aortic aneurysm or aortic dissection. You should avoid use of Cipro and other associated antibiotics (flouroquinolone antibiotics )  It is  best to avoid activities that cause grunting or straining (medically referred to as a "valsalva maneuver"). This happens when a person bears down against a closed throat to increase the strength of arm or abdominal muscles. There's often a tendency to do this when lifting heavy weights, doing sit-ups, push-ups or chin-ups, etc., but it may be harmful.     An aneurysm is a bulge in an artery. It happens when blood pushes up against a weakened or damaged artery wall. A thoracic aortic aneurysm is an aneurysm that occurs in the first part of the aorta, between the heart and the diaphragm. The aorta is the main artery of the body. It supplies blood from the heart to the rest of the body. Some aneurysms may not cause symptoms or problems. However, the major concern with a thoracic aortic aneurysm is that it can enlarge and burst (rupture), or blood can flow between the layers of the wall of the aorta through a tear (aorticdissection). Both of these conditions can cause bleeding inside the body and can be life-threatening if they are not diagnosed and treated right away. What are the causes? The  exact cause of this condition is not known. What increases the risk? The following factors may make you more likely to develop this condition:  Being age 49 or older.  Having a hardening of the arteries caused by the buildup of fat and other substances in the lining of a blood vessel (arteriosclerosis).  Having inflammation of the walls of an artery (arteritis).  Having a genetic disease that weakens the body's connective tissue, such as Marfan syndrome.  Having an injury or trauma to the aorta.  Having an infection that is caused by bacteria, such as syphilis or staphylococcus, in the wall of the aorta (infectious aortitis).  Having high blood pressure (hypertension).  Being female.  Being white (Caucasian).  Having high cholesterol.  Having a family history of aneurysms.  Using tobacco.  Having chronic obstructive pulmonary disease (COPD). What are the signs or symptoms? Symptoms of this condition vary depending on the size and rate of growth of the aneurysm. Most grow slowly and do not cause any symptoms. When symptoms do occur, they may include:  Pain in the chest, back, sides, or abdomen. The pain may vary in intensity. A sudden onset of severe pain may indicate that the aneurysm has ruptured.  Hoarseness.  Cough.  Shortness of breath.  Swallowing problems.  Swelling in the face, arms, or legs.  Fever.  Unexplained weight loss. How is this diagnosed? This condition may be diagnosed with:  An ultrasound.  X-rays.  A CT scan.  An MRI.  Tests to check the arteries for damage or blockages (angiogram). Most unruptured thoracic aortic aneurysms cause no symptoms, so they are often found during exams for other conditions. How is this treated? Treatment for this condition depends on:  The size of the aneurysm.  How fast the aneurysm is growing.  Your age.  Risk factors for rupture. Aneurysms that are smaller than 2.2 inches (5.5 cm) may be managed by  using medicines to control blood pressure, manage pain, or fight infection. You may need regular monitoring to see if the aneurysm is getting bigger. Your health care provider may recommend that you have an ultrasound every year or every 6 months. How often you need to have an ultrasound depends on the size of the aneurysm, how fast it is growing, and whether you have a family history of aneurysms. Surgical repair may be needed if your aneurysm is larger than 2.2 inches or if it is growing quickly. Follow these instructions at home: Eating and drinking   Eat a healthy diet. Your health care provider may recommend that you:  Lower your salt (sodium) intake. In some people, too much salt can raise blood pressure and increase the risk of thoracic aortic aneurysm.  Avoid foods that are high in saturated fat and cholesterol, such as red meat and dairy.  Eat a diet that is low in sugar.  Increase your fiber intake by including whole grains, vegetables, and fruits in your diet. Eating these foods may help to lower blood pressure.  Limit or avoid alcohol as recommended by your health care provider. Lifestyle   Follow instructions from your health care provider about healthy lifestyle habits. Your health care provider may recommend that you:  Do not use any products that contain nicotine or tobacco, such as cigarettes and e-cigarettes. If you need help quitting, ask your health care provider.  Keep your blood pressure within normal limits. The target limit for most people is below 120/80. Check your blood pressure regularly. If it is high, ask your health care provider about ways that you can control it.  Keep your blood sugar (glucose) level and cholesterol levels within normal limits. Target limits for most people are:  Blood glucose level: Less than 100 mg/dL.  Total cholesterol level: Less than 200 mg/dL.  Maintain a healthy weight. Activity   Stay physically active and exercise  regularly. Talk with your health care provider about how often you should exercise and ask which types of exercise are safe for you.  Avoid heavy lifting and activities that take a lot of effort (are strenuous). Ask your health care provider what activities are safe for you. General instructions   Keep all follow-up visits as told by your health care provider. This is important.  Talk with your health care provider about regular screenings to see if the aneurysm is getting bigger.  Take over-the-counter and prescription medicines only as told by your health care provider. Contact a health care provider if:  You have discomfort in your upper back, neck, or abdomen.  You have trouble swallowing.  You have a cough or hoarseness.  You have a family history of aneurysms.  You have unexplained weight loss. Get help right away if:  You have sudden, severe pain in your upper back and abdomen. This pain may move into your chest and arms.  You  have shortness of breath.  You have a fever. This information is not intended to replace advice given to you by your health care provider. Make sure you discuss any questions you have with your health care provider. Document Released: 03/22/2005 Document Revised: 01/02/2016 Document Reviewed: 01/02/2016 Elsevier Interactive Patient Education  2017 [employer].   Aortic Dissection An aortic dissection happens when there is a tear in the main blood vessel of the body (aorta). The aorta comes out of the heart, curves around, and then goes down the chest (thoracic aorta) and into the abdomen (abdominal aorta) to supply arteries with blood. The wall of the aorta has inner and outer layers. Aortic dissection occurs most often in the thoracic aorta. As the tear widens and blood flows through it, the aorta becomes "double-barreled." This means that one part of the aorta continues to carry blood to the body, but blood also flows into the tear, between the  layers of the aorta. The torn part of the aorta fills with blood and swells up. This can reduce blood flow through the part of the aorta that is still supplying blood to the body. Aortic dissection is a medical emergency. What are the causes? An aortic dissection is commonly caused by weakening of the artery wall due to high blood pressure. Other causes may include:  An injury, such as from a car crash.  Birth defects that affect the heart (congenital heart defects).  Thickening of the artery walls. In some cases, the cause is not known. What increases the risk? The following factors may make you more likely to develop this condition:  Having certain medical conditions, such as:  High blood pressure (hypertension).  Hardening and narrowing of the arteries (atherosclerosis).  A genetic disorder that affects the connective tissue, such as Marfan syndrome or Ehlers-Danlos syndrome.  A condition that causes inflammation of blood vessels, such as giant cell arteritis.  Having a chest injury.  Having surgery on the aorta.  Being born with a congenital heart defect.  Being female.  Being older than age 54.  Using cocaine.  Smoking.  Lifting heavy weights or doing other types of high-intensity resistance training. What are the signs or symptoms? Signs and symptoms of aortic dissection start suddenly. The most common symptoms are:  Severe chest pain that may feel like tearing, stabbing, or sharp pain.  Severe pain that spreads (radiates) to the back, neck, jaw, or abdomen. Other symptoms may include:  Trouble breathing.  Dizziness or fainting.  Sudden weakness on one side of the body.  Nausea or vomiting.  Trouble swallowing.  Coughing up blood.  Vomiting blood.  Clammy skin. How is this diagnosed? This condition may be diagnosed based on:  Your symptoms.  A physical exam. This may include:  Listening for abnormal blood flow sounds (murmurs) in your chest or  abdomen.  Checking your pulse in your arms and legs.  Checking your blood pressure to see whether it is low or whether there is a difference between the measurements in your right and left arm.  Electrocardiogram (ECG). This test measures the electrical activity in your heart.  Chest X-ray.  CT scan.  MRI.  Aortic angiogram. This test involves injecting dye to make it easier to see your blood vessels clearly.  Echocardiogram to study your heart using sound waves.  Blood tests. How is this treated? It is important to treat an aortic dissection as quickly as possible. Treatment may start as soon as your health care provider  thinks that you have aortic dissection. Treatment depends on the location and severity of your dissection and your overall health. Treatment may include:  Medicines to lower your blood pressure.  Surgery to repair the dissected part of your aorta with artificial material (syntheticgraft).  A medical procedure to insert a stent-graft into the aorta (endovascular procedure). During this procedure, a long, thin tube (stent) is inserted into an artery near the groin (femoral artery) and moved up to the damaged part of the aorta. Then, the stent is opened to help improve blood flow and prevent future dissection. Follow these instructions at home: Activity   Avoid activities that could injure your chest or your abdomen. Ask your health care provider what activities are safe for you.  After you have recovered, try to stay active. Ask your health care provider what activities are safe for you after recovery.  Do not lift anything that is heavier than 10 lb (4.5 kg) until your health care provider approves.  Do not drive or use heavy machinery while taking prescription pain medicine. Eating and drinking   Eat a heart-healthy diet, which includes lots of fresh fruits and vegetables, low-fat (lean) protein, and whole grains.  Check ingredients and nutrition facts on  packaged foods and beverages, and avoid foods with high amounts of:  Salt (sodium).  Saturated fats (like red meat).  Trans fats (like fried food). General instructions   Take over-the-counter and prescription medicines only as told by your health care provider.  Work with your health care provider to manage your blood pressure.  Talk with your health care provider about how to manage stress.  Do not use any products that contain nicotine or tobacco, such as cigarettes and e-cigarettes. If you need help quitting, ask your health care provider.  Keep all follow-up visits as told by your health care provider. This is important. Get help right away if:  You develop any symptoms of aortic dissection after treatment, including severe pain in your chest, back, or abdomen.  You have a pain in your abdomen.  You have trouble breathing or you develop a cough.  You faint.  You develop a racing heartbeat. These symptoms may represent a serious problem that is an emergency. Do not wait to see if the symptoms will go away. Get medical help right away. Call your local emergency services (911 in the [place] Do not drive yourself to the hospital. Summary  An aortic dissection happens when there is a tear in the main blood vessel of the body (aorta). It is a medical emergency.  The most common symptom is severe pain in the chest that spreads (radiates) to the back, neck, jaw, or abdomen.  It is important to treat an aortic dissection as quickly as possible. Treatment typically includes surgery and medicines. This information is not intended to replace advice given to you by your health care provider. Make sure you discuss any questions you have with your health care provider. Document Released: 06/29/2007 Document Revised: 02/09/2016 Document Reviewed: 02/09/2016 Elsevier Interactive Patient Education  2017 [employer].     Call the [place] office at [phone] if you have any questions, problems or concerns.

## 2017-05-25 ENCOUNTER — Ambulatory Visit (INDEPENDENT_AMBULATORY_CARE_PROVIDER_SITE_OTHER): Admitting: Internal Medicine

## 2017-05-25 ENCOUNTER — Encounter: Payer: Self-pay | Admitting: Internal Medicine

## 2017-05-25 VITALS — BP 138/84 | HR 64 | Temp 97.3°F | Resp 18 | Ht 67.75 in | Wt 250.6 lb

## 2017-05-25 DIAGNOSIS — Z6838 Body mass index (BMI) 38.0-38.9, adult: Secondary | ICD-10-CM | POA: Diagnosis not present

## 2017-05-25 DIAGNOSIS — I10 Essential (primary) hypertension: Secondary | ICD-10-CM

## 2017-05-25 DIAGNOSIS — E782 Mixed hyperlipidemia: Secondary | ICD-10-CM

## 2017-05-25 DIAGNOSIS — M810 Age-related osteoporosis without current pathological fracture: Secondary | ICD-10-CM | POA: Diagnosis not present

## 2017-05-25 DIAGNOSIS — R7303 Prediabetes: Secondary | ICD-10-CM

## 2017-05-25 DIAGNOSIS — E559 Vitamin D deficiency, unspecified: Secondary | ICD-10-CM | POA: Diagnosis not present

## 2017-05-25 DIAGNOSIS — I480 Paroxysmal atrial fibrillation: Secondary | ICD-10-CM | POA: Diagnosis not present

## 2017-05-25 DIAGNOSIS — Z79899 Other long term (current) drug therapy: Secondary | ICD-10-CM | POA: Diagnosis not present

## 2017-05-25 MED ORDER — ALENDRONATE SODIUM 70 MG PO TABS: ORAL_TABLET | ORAL | 3 refills | Status: DC

## 2017-05-25 NOTE — Patient Instructions (Signed)

## 2017-05-25 NOTE — Progress Notes (Signed)
This very nice 67 y.o. MWF presents for 3 month follow up with Hypertension, Hyperlipidemia, Pre-Diabetes and Vitamin D Deficiency.      Patient is treated for HTN (1996) & BP has been controlled at home. Today's BP is at goal - 138/84. Patient has hx/o pAfib followed by Dr [name] & has declined Rx anticoagulation.  Patient has had no complaints of any cardiac type chest pain, palpitations, dyspnea / orthopnea / PND, dizziness, claudication, or dependent edema.     Hyperlipidemia is controlled with diet & meds. Patient denies myalgias or other med SE's. Last Lipids were  Lab Results  Component Value Date   CHOL 153 08/17/2016   HDL 40 (L) 08/17/2016   LDLCALC 54 08/17/2016   TRIG 297 (H) 08/17/2016   CHOLHDL 3.8 08/17/2016      Also, the patient has history of Morbid Obesity (BMI 38+) and PreDiabetes (A1c 5.8% /2012) and has had no symptoms of reactive hypoglycemia, diabetic polys, paresthesias or visual blurring.  Last A1c was Normal & at goal: Lab Results  Component Value Date   HGBA1C 5.5 10/25/2016      Further, the patient also has history of Vitamin D Deficiency ("18"/2008) and supplements vitamin D without any suspected side-effects. Last vitamin D was at goal: Lab Results  Component Value Date   VD25OH 82 10/25/2016   Current Outpatient Medications on File Prior to Visit  Medication Sig  . ALPRAZolam (XANAX) 0.25 MG tablet Take 1 tablet (0.25 mg total) by mouth 3 (three) times daily as needed for anxiety.  Marland Kitchen atenolol (TENORMIN) 50 MG tablet Take 0.5 tablets (25 mg total) daily by mouth. May take extra 1/2 tab prn palpitations.  . Cholecalciferol (VITAMIN D-3 PO) Take 8,000 Units by mouth daily.   Marland Kitchen CINNAMON PO Take 1,000 mg by mouth 2 (two) times daily.  . diclofenac sodium (VOLTAREN) 1 % GEL Apply 4 g topically 4 (four) times daily.  . DiphenhydrAMINE HCl (BENADRYL ALLERGY PO) Take by mouth as needed.  Marland Kitchen ELIQUIS 5 MG TABS tablet TAKE 1 TABLET TWICE A DAY  . Flaxseed,  Linseed, (FLAX SEEDS PO) Take 1,000 mg by mouth 2 (two) times daily.  . hydrochlorothiazide (HYDRODIURIL) 25 MG tablet TAKE 1 TABLET DAILY  . montelukast (SINGULAIR) 10 MG tablet TAKE 1 TABLET DAILY  . PREMPRO 0.625-2.5 MG tablet TAKE AS DIRECTED  . quinapril (ACCUPRIL) 40 MG tablet TAKE 1 TABLET DAILY  . vitamin B-12 (CYANOCOBALAMIN) 1000 MCG tablet Take 1,000 mcg by mouth daily.   No current facility-administered medications on file prior to visit.    Allergies  Allergen Reactions  . Ciprofloxacin   . Penicillins Other (See Comments)    Had as a child=unknown reaction  . Zoloft [Sertraline Hcl]    PMHx:   Past Medical History:  Diagnosis Date  . Achilles tendonitis    R heal, limits activity  . Allergy   . Anxiety   . Elevated hemoglobin A1c   . Hyperlipidemia   . Hypertension   . IBS (irritable bowel syndrome)   . Insomnia   . Obesity   . Palpitations   . Vitamin D deficiency    Immunization History  Administered Date(s) Administered  . Influenza Split 02/19/2013  . Influenza, Seasonal, Injecte, Preservative Fre 03/12/2015  . Influenza-Unspecified 04/06/2011  . PPD Test 10/15/2013  . Pneumococcal-Unspecified 04/06/1995  . Td 10/25/2016  . Tdap 04/05/2006  . Zoster 07/04/2013   Past Surgical History:  Procedure Laterality Date  .  CESAREAN SECTION    . CESAREAN SECTION    . CHOLECYSTECTOMY  10 years ago  . TUBAL LIGATION Bilateral    FHx:    Reviewed / unchanged  SHx:    Reviewed / unchanged   Systems Review:  Constitutional: Denies fever, chills, wt changes, headaches, insomnia, fatigue, night sweats, change in appetite. Eyes: Denies redness, blurred vision, diplopia, discharge, itchy, watery eyes.  ENT: Denies discharge, congestion, post nasal drip, epistaxis, sore throat, earache, hearing loss, dental pain, tinnitus, vertigo, sinus pain, snoring.  CV: Denies chest pain, palpitations, irregular heartbeat, syncope, dyspnea, diaphoresis, orthopnea, PND,  claudication or edema. Respiratory: denies cough, dyspnea, DOE, pleurisy, hoarseness, laryngitis, wheezing.  Gastrointestinal: Denies dysphagia, odynophagia, heartburn, reflux, water brash, abdominal pain or cramps, nausea, vomiting, bloating, diarrhea, constipation, hematemesis, melena, hematochezia  or hemorrhoids. Genitourinary: Denies dysuria, frequency, urgency, nocturia, hesitancy, discharge, hematuria or flank pain. Musculoskeletal: Denies arthralgias, myalgias, stiffness, jt. swelling, pain, limping or strain/sprain.  Skin: Denies pruritus, rash, hives, warts, acne, eczema or change in skin lesion(s). Neuro: No weakness, tremor, incoordination, spasms, paresthesia or pain. Psychiatric: Denies confusion, memory loss or sensory loss. Endo: Denies change in weight, skin or hair change.  Heme/Lymph: No excessive bleeding, bruising or enlarged lymph nodes.  Physical Exam  BP 138/84   Pulse 64   Temp (!) 97.3 F (36.3 C)   Resp 18   Ht 5' 7.75" (1.721 m)   Wt 250 lb 9.6 oz (113.7 kg)   BMI 38.39 kg/m   Appears over nourished, well groomed  and in no distress.  Eyes: PERRLA, EOMs, conjunctiva no swelling or erythema. Sinuses: No frontal/maxillary tenderness ENT/Mouth: EAC's clear, TM's nl w/o erythema, bulging. Nares clear w/o erythema, swelling, exudates. Oropharynx clear without erythema or exudates. Oral hygiene is good. Tongue normal, non obstructing. Hearing intact.  Neck: Supple. Thyroid not palpable. Car 2+/2+ without bruits, nodes or JVD. Chest: Respirations nl with BS clear & equal w/o rales, rhonchi, wheezing or stridor.  Cor: Heart sounds normal w/ regular rate and rhythm without sig. murmurs, gallops, clicks or rubs. Peripheral pulses normal and equal  without edema.  Abdomen: Soft & bowel sounds normal. Non-tender w/o guarding, rebound, hernias, masses or organomegaly.  Lymphatics: Unremarkable.  Musculoskeletal: Full ROM all peripheral extremities, joint stability, 5/5  strength and normal gait.  Skin: Warm, dry without exposed rashes, lesions or ecchymosis apparent.  Neuro: Cranial nerves intact, reflexes equal bilaterally. Sensory-motor testing grossly intact. Tendon reflexes grossly intact.  Pysch: Alert & oriented x 3.  Insight and judgement nl & appropriate. No ideations.  Assessment and Plan:  1. Essential hypertension  - Continue medication, monitor blood pressure at home.  - Continue DASH diet. Reminder to go to the ER if any CP,  SOB, nausea, dizziness, severe HA, changes vision/speech.  - CBC with Differential/Platelet - BASIC METABOLIC PANEL WITH GFR - Magnesium - TSH  2. Hyperlipidemia, mixed  - Continue diet/meds, exercise,& lifestyle modifications.  - Continue monitor periodic cholesterol/liver & renal functions   - Hepatic function panel - Lipid panel - TSH  3. Prediabetes  - Continue diet, exercise, lifestyle modifications.  - Monitor appropriate labs.  - Hemoglobin A1c - Insulin, random  4. Vitamin D deficiency  - Continue supplementation.  - VITAMIN D 25 Hydroxy  5. Paroxysmal atrial fibrillation (HCC)  - TSH  6. Osteoporosis  - Restart  - alendronate (FOSAMAX) 70 MG tablet; Take 1 tablet weekly on an empty stomach with only  a full glass of water for  60 minutes  Dispense: 14 tablet; Refill: 3  7. Class 2 severe obesity due to excess calories with serious comorbidity and body mass index (BMI) of 38.0 to 38.9 in adult ([place])  8. Medication management  - CBC with Differential/Platelet - BASIC METABOLIC PANEL WITH GFR - Hepatic function panel - Magnesium - Lipid panel - TSH - Hemoglobin A1c - Insulin, random - VITAMIN D 25 Hydroxy        Discussed  regular exercise, BP monitoring, weight control to achieve/maintain BMI less than 25 and discussed med and SE's. Recommended labs to assess and monitor clinical status with further disposition pending results of labs. Over 30 minutes of exam, counseling, chart  review was performed.

## 2017-05-26 LAB — BASIC METABOLIC PANEL WITH GFR
BUN: 15 mg/dL (ref 7–25)
CALCIUM: 9.2 mg/dL (ref 8.6–10.4)
CHLORIDE: 103 mmol/L (ref 98–110)
CO2: 25 mmol/L (ref 20–32)
CREATININE: 0.89 mg/dL (ref 0.50–0.99)
GFR, EST NON AFRICAN AMERICAN: 68 mL/min/{1.73_m2} (ref >=60)
GFR, Est African American: 79 mL/min/{1.73_m2} (ref >=60)
Glucose, Bld: 97 mg/dL (ref 65–99)
Potassium: 4.6 mmol/L (ref 3.5–5.3)
Sodium: 137 mmol/L (ref 135–146)

## 2017-05-26 LAB — CBC WITH DIFFERENTIAL/PLATELET
Basophils Absolute: 58 cells/uL (ref 0–200)
Basophils Relative: 0.8 %
Eosinophils Absolute: 180 cells/uL (ref 15–500)
Eosinophils Relative: 2.5 %
HCT: 38.5 % (ref 35.0–45.0)
Hemoglobin: 13.2 g/dL (ref 11.7–15.5)
Lymphs Abs: 1908 cells/uL (ref 850–3900)
MCH: 29.6 pg (ref 27.0–33.0)
MCHC: 34.3 g/dL (ref 32.0–36.0)
MCV: 86.3 fL (ref 80.0–100.0)
MPV: 10.5 fL (ref 7.5–12.5)
Monocytes Relative: 11.9 %
NEUTROS PCT: 58.3 %
Neutro Abs: 4198 cells/uL (ref 1500–7800)
Platelets: 294 10*3/uL (ref 140–400)
RBC: 4.46 10*6/uL (ref 3.80–5.10)
RDW: 12.4 % (ref 11.0–15.0)
Total Lymphocyte: 26.5 %
WBC: 7.2 10*3/uL (ref 3.8–10.8)
WBCMIX: 857 {cells}/uL (ref 200–950)

## 2017-05-26 LAB — LIPID PANEL
Cholesterol: 190 mg/dL (ref <200)
HDL: 56 mg/dL (ref >50)
LDL Cholesterol (Calc): 97 mg/dL (calc)
NON-HDL CHOLESTEROL (CALC): 134 mg/dL — AB (ref <130)
TRIGLYCERIDES: 244 mg/dL — AB (ref <150)
Total CHOL/HDL Ratio: 3.4 (calc) (ref <5.0)

## 2017-05-26 LAB — HEPATIC FUNCTION PANEL
AG Ratio: 1.2 (calc) (ref 1.0–2.5)
ALBUMIN MSPROF: 3.9 g/dL (ref 3.6–5.1)
ALT: 13 U/L (ref 6–29)
AST: 18 U/L (ref 10–35)
Alkaline phosphatase (APISO): 61 U/L (ref 33–130)
BILIRUBIN DIRECT: 0.1 mg/dL (ref 0.0–0.2)
BILIRUBIN TOTAL: 0.7 mg/dL (ref 0.2–1.2)
Globulin: 3.2 g/dL (calc) (ref 1.9–3.7)
Indirect Bilirubin: 0.6 mg/dL (calc) (ref 0.2–1.2)
Total Protein: 7.1 g/dL (ref 6.1–8.1)

## 2017-05-26 LAB — INSULIN, RANDOM: INSULIN: 7.9 u[IU]/mL (ref 2.0–19.6)

## 2017-05-26 LAB — MAGNESIUM: Magnesium: 2 mg/dL (ref 1.5–2.5)

## 2017-05-26 LAB — TSH: TSH: 1.22 mIU/L (ref 0.40–4.50)

## 2017-05-26 LAB — HEMOGLOBIN A1C
HEMOGLOBIN A1C: 5.6 %{Hb} (ref <5.7)
Mean Plasma Glucose: 114 (calc)
eAG (mmol/L): 6.3 (calc)

## 2017-05-26 LAB — VITAMIN D 25 HYDROXY (VIT D DEFICIENCY, FRACTURES): Vit D, 25-Hydroxy: 66 ng/mL (ref 30–100)

## 2017-05-29 ENCOUNTER — Other Ambulatory Visit: Payer: Self-pay | Admitting: Internal Medicine

## 2017-06-12 ENCOUNTER — Other Ambulatory Visit: Payer: Self-pay | Admitting: Internal Medicine

## 2017-07-12 ENCOUNTER — Other Ambulatory Visit: Payer: Self-pay | Admitting: Physician Assistant

## 2017-07-21 ENCOUNTER — Other Ambulatory Visit: Payer: Self-pay | Admitting: *Deleted

## 2017-08-18 ENCOUNTER — Telehealth: Payer: Self-pay | Admitting: Cardiovascular Disease

## 2017-08-18 NOTE — Telephone Encounter (Signed)
Called patient back about her question. Informed patient that eliquis is not usually stopped for routine dental cleanings. Informed patient to have dentist send any request for medical or pharmacy clearance to our office before any dental procedure other then cleanings. Patient verbalized understanding.

## 2017-08-18 NOTE — Telephone Encounter (Signed)
New Message:       Pt is calling to inquire on whether she should stop taking ELIQUIS 5 MG TABS tablet before a dental cleaning.

## 2017-09-14 ENCOUNTER — Ambulatory Visit: Payer: Self-pay | Admitting: Adult Health

## 2017-10-11 NOTE — Progress Notes (Signed)
FOLLOW UP  Assessment and Plan:   Paroxysmal atrial fibrillation (HCC) Continue meds, continue cardio follow up, no symptoms at this time.   Hypertension Well controlled with current medications  Monitor blood pressure at home; patient to call if consistently greater than 130/80 Continue DASH diet.   Reminder to go to the ER if any CP, SOB, nausea, dizziness, severe HA, changes vision/speech, left arm numbness and tingling and jaw pain.  Cholesterol Currently at LDL goal without medication; diet for trigs discussed, suggested omega 3 Continue low cholesterol diet and exercise.  Check lipid panel.   Prediabetes Recent A1Cs borderline Continue diet and exercise.  Perform daily foot/skin check, notify office of any concerning changes.  Check A1C  Obesity with co morbidities Long discussion about weight loss, diet, and exercise Recommended diet heavy in fruits and veggies and low in animal meats, cheeses, and dairy products, appropriate calorie intake Discussed ideal weight for height  Patient will work on continue plant based, watch portions, increase exercise Will follow up in 3 months  Vitamin D Def At goal at last visit; continue supplementation to maintain goal of 70-100 Defer Vit D level  Insomnia good sleep hygiene discussed, increase day time activity Doing well with benadryl   Continue diet and meds as discussed. Further disposition pending results of labs. Discussed med's effects and SE's.   Over 30 minutes of exam, counseling, chart review, and critical decision making was performed.   Future Appointments  Date Time Provider [place]  11/10/2017 11:00 AM Josue Hector, MD [facility]  12/21/2017 11:00 AM Unk Pinto, MD [facility] None    ----------------------------------------------------------------------------------------------------------------------  HPI 67 y.o. female  presents for 3 month follow up on hypertension,  cholesterol, diabetes, weight and vitamin D deficiency. She has been following with [facility], has Afib, Thoracic aortic anuerysm, on elliquis.    She takes benadryl at night for sleep; has been prescribed xanax for anxiety but has never taken in over a year.   BMI is Body mass index is 37.99 kg/m., she has been working on diet, has switched to plant based diet in the past 2 months.  Wt Readings from Last 3 Encounters:  10/12/17 248 lb (112.5 kg)  05/25/17 250 lb 9.6 oz (113.7 kg)  05/10/17 250 lb 1.9 oz (113.5 kg)   Her blood pressure has been controlled at home, today their BP is BP: 124/72  She does not workout. She denies chest pain, shortness of breath, dizziness.   She is not on cholesterol medication and denies myalgias. Her cholesterol is at goal. The cholesterol last visit was:   Lab Results  Component Value Date   CHOL 190 05/25/2017   HDL 56 05/25/2017   LDLCALC 97 05/25/2017   TRIG 244 (H) 05/25/2017   CHOLHDL 3.4 05/25/2017    She has been working on diet and exercise for prediabetes, and denies foot ulcerations, increased appetite, nausea, paresthesia of the feet, polydipsia, polyuria, visual disturbances, vomiting and weight loss. Last A1C in the office was:  Lab Results  Component Value Date   HGBA1C 5.6 05/25/2017   Patient is on Vitamin D supplement.   Lab Results  Component Value Date   VD25OH 66 05/25/2017        Current Medications:  Current Outpatient Medications on File Prior to Visit  Medication Sig  . atenolol (TENORMIN) 50 MG tablet Take 0.5 tablets (25 mg total) daily by mouth. May take extra 1/2 tab prn palpitations.  . Cholecalciferol (VITAMIN D-3  PO) Take 8,000 Units by mouth daily.   Marland Kitchen CINNAMON PO Take 1,000 mg by mouth 2 (two) times daily.  . diclofenac sodium (VOLTAREN) 1 % GEL Apply 4 g topically 4 (four) times daily.  . DiphenhydrAMINE HCl (BENADRYL ALLERGY PO) Take by mouth as needed.  Marland Kitchen ELIQUIS 5 MG TABS tablet TAKE 1 TABLET  TWICE A DAY  . Flaxseed, Linseed, (FLAX SEEDS PO) Take 1,000 mg by mouth 2 (two) times daily.  . hydrochlorothiazide (HYDRODIURIL) 25 MG tablet TAKE 1 TABLET DAILY  . PREMPRO 0.625-2.5 MG tablet TAKE AS DIRECTED  . quinapril (ACCUPRIL) 40 MG tablet TAKE 1 TABLET DAILY  . vitamin B-12 (CYANOCOBALAMIN) 1000 MCG tablet Take 1,000 mcg by mouth daily.  . montelukast (SINGULAIR) 10 MG tablet TAKE 1 TABLET DAILY (Patient not taking: Reported on 10/12/2017)   No current facility-administered medications on file prior to visit.      Allergies:  Allergies  Allergen Reactions  . Ciprofloxacin   . Levaquin [Levofloxacin In D5w]     No fluroquinolones due to ascending thoracic aortic aneurysm  . Penicillins Other (See Comments)    Had as a child=unknown reaction  . Zoloft [Sertraline Hcl]      Medical History:  Past Medical History:  Diagnosis Date  . Achilles tendonitis    R heal, limits activity  . Allergy   . Anxiety   . Elevated hemoglobin A1c   . Hyperlipidemia   . Hypertension   . IBS (irritable bowel syndrome)   . Insomnia   . Obesity   . Palpitations   . Vitamin D deficiency    Family history- Reviewed and unchanged Social history- Reviewed and unchanged   Review of Systems:  Review of Systems  Constitutional: Negative for malaise/fatigue and weight loss.  HENT: Negative for hearing loss and tinnitus.   Eyes: Negative for blurred vision and double vision.  Respiratory: Negative for cough, shortness of breath and wheezing.   Cardiovascular: Positive for palpitations (Intermittent, can feel a. fib). Negative for chest pain, orthopnea, claudication and leg swelling.  Gastrointestinal: Negative for abdominal pain, blood in stool, constipation, diarrhea, heartburn, melena, nausea and vomiting.  Genitourinary: Negative.   Musculoskeletal: Negative for joint pain and myalgias.  Skin: Negative for rash.  Neurological: Negative for dizziness, tingling, sensory change, weakness  and headaches.  Endo/Heme/Allergies: Negative for polydipsia.  Psychiatric/Behavioral: Negative.   All other systems reviewed and are negative.     Physical Exam: BP 124/72   Pulse (!) 58   Temp (!) 97.3 F (36.3 C)   Ht 5' 7.75" (1.721 m)   Wt 248 lb (112.5 kg)   SpO2 98%   BMI 37.99 kg/m  Wt Readings from Last 3 Encounters:  10/12/17 248 lb (112.5 kg)  05/25/17 250 lb 9.6 oz (113.7 kg)  05/10/17 250 lb 1.9 oz (113.5 kg)   General Appearance: Well nourished, in no apparent distress. Eyes: PERRLA, EOMs, conjunctiva no swelling or erythema Sinuses: No Frontal/maxillary tenderness ENT/Mouth: Ext aud canals clear, TMs without erythema, bulging. No erythema, swelling, or exudate on post pharynx.  Tonsils not swollen or erythematous. Hearing normal.  Neck: Supple, thyroid normal.  Respiratory: Respiratory effort normal, BS equal bilaterally without rales, rhonchi, wheezing or stridor.  Cardio: RRR with no MRGs. Brisk peripheral pulses without edema.  Abdomen: Soft, + BS.  Non tender, no guarding, rebound, hernias, masses. Lymphatics: Non tender without lymphadenopathy.  Musculoskeletal: Full ROM, 5/5 strength, Normal gait Skin: Warm, dry without rashes, lesions, ecchymosis.  Neuro:  Cranial nerves intact. No cerebellar symptoms.  Psych: Awake and oriented X 3, normal affect, Insight and Judgment appropriate.    Claudia Ribas, NP 11:00 AM [facility]

## 2017-10-12 ENCOUNTER — Encounter: Payer: Self-pay | Admitting: Adult Health

## 2017-10-12 ENCOUNTER — Ambulatory Visit (INDEPENDENT_AMBULATORY_CARE_PROVIDER_SITE_OTHER): Payer: Medicare Other | Admitting: Adult Health

## 2017-10-12 VITALS — BP 124/72 | HR 58 | Temp 97.3°F | Ht 67.75 in | Wt 248.0 lb

## 2017-10-12 DIAGNOSIS — E559 Vitamin D deficiency, unspecified: Secondary | ICD-10-CM

## 2017-10-12 DIAGNOSIS — R7303 Prediabetes: Secondary | ICD-10-CM | POA: Diagnosis not present

## 2017-10-12 DIAGNOSIS — I10 Essential (primary) hypertension: Secondary | ICD-10-CM

## 2017-10-12 DIAGNOSIS — Z79899 Other long term (current) drug therapy: Secondary | ICD-10-CM | POA: Diagnosis not present

## 2017-10-12 DIAGNOSIS — G4700 Insomnia, unspecified: Secondary | ICD-10-CM

## 2017-10-12 DIAGNOSIS — I480 Paroxysmal atrial fibrillation: Secondary | ICD-10-CM

## 2017-10-12 DIAGNOSIS — E782 Mixed hyperlipidemia: Secondary | ICD-10-CM

## 2017-10-12 NOTE — Patient Instructions (Signed)
Aim for 7+ servings of fruits and vegetables daily  80+ fluid ounces of water or unsweet tea for healthy kidneys  Limit animal fats in diet for cholesterol and heart health - choose grass fed whenever available  Aim for low stress - take time to unwind and care for your mental health  Aim for 150 min of moderate intensity exercise weekly for heart health, and weights twice weekly for bone health  Aim for 7-9 hours of sleep daily   Drink 1/2 your body weight in fluid ounces of water daily; drink a tall glass of water 30 min before meals  Don't eat until you're stuffed- listen to your stomach and eat until you are 80% full   Try eating off of a salad plate; wait 10 min after finishing before going back for seconds  Start by eating the vegetables on your plate; aim for 50% of your meals to be fruits or vegetables  Then eat your protein - lean meats (grass fed if possible), fish, beans, nuts in moderation  Eat your carbs/starch last ONLY if you still are hungry. If you can, stop before finishing it all  Avoid sugar and flour - the closer it looks to it's original form in nature, typically the better it is for you  Splurge in moderation - "assign" days when you get to splurge and have the "bad stuff" - I like to follow a 80% - 20% plan- "good" choices 80 % of the time, "bad" choices in moderation 20% of the time  Simple equation is: Calories out > calories in = weight loss - even if you eat the bad stuff, if you limit portions, you will still lose weight      When it comes to diets, agreement about the perfect plan isn't easy to find, even among the experts. Experts at the [employer] developed an idea known as the Healthy Eating Plate. Just imagine a plate divided into logical, healthy portions.  The emphasis is on diet quality:  Load up on vegetables and fruits - one-half of your plate: Aim for color and variety, and remember that potatoes don't count.  Go  for whole grains - one-quarter of your plate: Whole wheat, barley, wheat berries, quinoa, oats, brown rice, and foods made with them. If you want pasta, go with whole wheat pasta.  Protein power - one-quarter of your plate: Fish, chicken, beans, and nuts are all healthy, versatile protein sources. Limit red meat.  The diet, however, does go beyond the plate, offering a few other suggestions.  Use healthy plant oils, such as olive, canola, soy, corn, sunflower and peanut. Check the labels, and avoid partially hydrogenated oil, which have unhealthy trans fats.  If you're thirsty, drink water. Coffee and tea are good in moderation, but skip sugary drinks and limit milk and dairy products to one or two daily servings.  The type of carbohydrate in the diet is more important than the amount. Some sources of carbohydrates, such as vegetables, fruits, whole grains, and beans-are healthier than others.  Finally, stay active.   

## 2017-10-13 ENCOUNTER — Other Ambulatory Visit: Payer: Self-pay

## 2017-10-13 LAB — COMPLETE METABOLIC PANEL WITH GFR
AG RATIO: 1.4 (calc) (ref 1.0–2.5)
ALBUMIN MSPROF: 3.9 g/dL (ref 3.6–5.1)
ALT: 15 U/L (ref 6–29)
AST: 16 U/L (ref 10–35)
Alkaline phosphatase (APISO): 53 U/L (ref 33–130)
BILIRUBIN TOTAL: 0.6 mg/dL (ref 0.2–1.2)
BUN: 11 mg/dL (ref 7–25)
CALCIUM: 9.4 mg/dL (ref 8.6–10.4)
CHLORIDE: 103 mmol/L (ref 98–110)
CO2: 26 mmol/L (ref 20–32)
Creat: 0.85 mg/dL (ref 0.50–0.99)
GFR, EST AFRICAN AMERICAN: 83 mL/min/{1.73_m2} (ref >=60)
GFR, EST NON AFRICAN AMERICAN: 72 mL/min/{1.73_m2} (ref >=60)
Globulin: 2.8 g/dL (calc) (ref 1.9–3.7)
Glucose, Bld: 101 mg/dL — ABNORMAL HIGH (ref 65–99)
POTASSIUM: 4.2 mmol/L (ref 3.5–5.3)
Sodium: 139 mmol/L (ref 135–146)
TOTAL PROTEIN: 6.7 g/dL (ref 6.1–8.1)

## 2017-10-13 LAB — HEMOGLOBIN A1C
HEMOGLOBIN A1C: 5.4 %{Hb} (ref <5.7)
MEAN PLASMA GLUCOSE: 108 (calc)
eAG (mmol/L): 6 (calc)

## 2017-10-13 LAB — CBC WITH DIFFERENTIAL/PLATELET
BASOS PCT: 0.8 %
Basophils Absolute: 52 cells/uL (ref 0–200)
EOS ABS: 163 {cells}/uL (ref 15–500)
Eosinophils Relative: 2.5 %
HCT: 40.3 % (ref 35.0–45.0)
HEMOGLOBIN: 13.4 g/dL (ref 11.7–15.5)
LYMPHS ABS: 1684 {cells}/uL (ref 850–3900)
MCH: 29.5 pg (ref 27.0–33.0)
MCHC: 33.3 g/dL (ref 32.0–36.0)
MCV: 88.8 fL (ref 80.0–100.0)
MPV: 10.6 fL (ref 7.5–12.5)
Monocytes Relative: 11.3 %
Neutro Abs: 3868 cells/uL (ref 1500–7800)
Neutrophils Relative %: 59.5 %
Platelets: 309 10*3/uL (ref 140–400)
RBC: 4.54 10*6/uL (ref 3.80–5.10)
RDW: 12.6 % (ref 11.0–15.0)
Total Lymphocyte: 25.9 %
WBC mixed population: 735 cells/uL (ref 200–950)
WBC: 6.5 10*3/uL (ref 3.8–10.8)

## 2017-10-13 LAB — LIPID PANEL
Cholesterol: 163 mg/dL (ref <200)
HDL: 54 mg/dL (ref >50)
LDL CHOLESTEROL (CALC): 75 mg/dL
NON-HDL CHOLESTEROL (CALC): 109 mg/dL (ref <130)
Total CHOL/HDL Ratio: 3 (calc) (ref <5.0)
Triglycerides: 245 mg/dL — ABNORMAL HIGH (ref <150)

## 2017-10-13 LAB — TSH: TSH: 1.25 m[IU]/L (ref 0.40–4.50)

## 2017-10-13 MED ORDER — DICYCLOMINE HCL 10 MG PO CAPS: ORAL_CAPSULE | ORAL | 1 refills | Status: DC

## 2017-10-13 NOTE — Telephone Encounter (Signed)
Verbal order for Bentyl 10mg  capsule QID PRN, #120 with 1 RF.

## 2017-10-31 DIAGNOSIS — M1711 Unilateral primary osteoarthritis, right knee: Secondary | ICD-10-CM | POA: Diagnosis not present

## 2017-10-31 DIAGNOSIS — M1712 Unilateral primary osteoarthritis, left knee: Secondary | ICD-10-CM | POA: Diagnosis not present

## 2017-11-07 DIAGNOSIS — M1712 Unilateral primary osteoarthritis, left knee: Secondary | ICD-10-CM | POA: Diagnosis not present

## 2017-11-07 DIAGNOSIS — M1711 Unilateral primary osteoarthritis, right knee: Secondary | ICD-10-CM | POA: Diagnosis not present

## 2017-11-09 ENCOUNTER — Encounter: Payer: Self-pay | Admitting: Internal Medicine

## 2017-11-09 NOTE — Progress Notes (Signed)
CARDIOLOGY OFFICE NOTE  Date:  11/10/2017    Claudia [name] Date of Birth: 09/10/1950 Medical Record #[id number]  PCP:  Unk Pinto, MD  Cardiologist:  [name]  No chief complaint on file.   History of Present Illness: 67 y.o. with HTN, PAF, DM likely OSA but no sleep study done. She has CHA2VASC score 3 and is maintained on eliquis. Tends toward bradycardia and beta blocker dose has been decreased   Event monitor benign PAC;s 10/26/16 Myovue normal EF 68% 10/19/16   Stable mild aortic root dilatation by CT 04/11/17 4.2 cm   Primary did lots blood work including TSH which was normal   IBS bothering her more   Past Medical History:  Diagnosis Date  . Achilles tendonitis    R heal, limits activity  . Allergy   . Anxiety   . Elevated hemoglobin A1c   . Hyperlipidemia   . Hypertension   . IBS (irritable bowel syndrome)   . Insomnia   . Obesity   . Palpitations   . Vitamin D deficiency     Past Surgical History:  Procedure Laterality Date  . CESAREAN SECTION    . CESAREAN SECTION    . CHOLECYSTECTOMY  10 years ago  . TUBAL LIGATION Bilateral      Medications: Current Meds  Medication Sig  . atenolol (TENORMIN) 50 MG tablet Take 0.5 tablets (25 mg total) daily by mouth. May take extra 1/2 tab prn palpitations.  . Cholecalciferol (VITAMIN D-3 PO) Take 8,000 Units by mouth daily.   Marland Kitchen CINNAMON PO Take 1,000 mg by mouth 2 (two) times daily.  . diclofenac sodium (VOLTAREN) 1 % GEL Apply 4 g topically 4 (four) times daily.  Marland Kitchen dicyclomine (BENTYL) 10 MG capsule Take one capsule by mouth four times a day as needed for cramping/urgency  . DiphenhydrAMINE HCl (BENADRYL ALLERGY PO) Take by mouth as needed (allergies).   Marland Kitchen ELIQUIS 5 MG TABS tablet TAKE 1 TABLET TWICE A DAY  . Flaxseed, Linseed, (FLAX SEEDS PO) Take 1,000 mg by mouth 2 (two) times daily.  . hydrochlorothiazide (HYDRODIURIL) 25 MG tablet TAKE 1 TABLET DAILY  . PREMPRO 0.625-2.5 MG tablet TAKE AS  DIRECTED  . quinapril (ACCUPRIL) 40 MG tablet TAKE 1 TABLET DAILY  . vitamin B-12 (CYANOCOBALAMIN) 1000 MCG tablet Take 1,000 mcg by mouth daily.     Allergies: Allergies  Allergen Reactions  . Ciprofloxacin   . Levaquin [Levofloxacin In D5w]     No fluroquinolones due to ascending thoracic aortic aneurysm  . Penicillins Other (See Comments)    Had as a child=unknown reaction  . Zoloft [Sertraline Hcl]     Social History: The patient  reports that she quit smoking about 13 years ago. She has never used smokeless tobacco. She reports that she drinks alcohol. She reports that she does not use drugs.   Family History: The patient's family history includes Alcohol abuse in her father; Diabetes in her mother; Heart attack in her father and mother; Heart disease in her mother; Macular degeneration in her mother; Ovarian cancer in her maternal grandmother; Stroke in her mother.   Review of Systems: Please see the history of present illness.   Otherwise, the review of systems is positive for none.   All other systems are reviewed and negative.   Physical Exam: VS:  BP 118/84   Pulse (!) 53   Ht 5' 7.75" (1.721 m)   Wt 246 lb 8 oz (111.8 kg)  SpO2 98%   BMI 37.76 kg/m  .  BMI Body mass index is 37.76 kg/m.  Wt Readings from Last 3 Encounters:  11/10/17 246 lb 8 oz (111.8 kg)  10/12/17 248 lb (112.5 kg)  05/25/17 250 lb 9.6 oz (113.7 kg)   Affect appropriate Healthy:  appears stated age 46: normal Neck supple with no adenopathy JVP normal no bruits no thyromegaly Lungs clear with no wheezing and good diaphragmatic motion Heart:  S1/S2 no murmur, no rub, gallop or click PMI normal Abdomen: benighn, BS positve, no tenderness, no AAA no bruit.  No HSM or HJR Distal pulses intact with no bruits No edema Neuro non-focal Skin warm and dry No muscular weakness    LABORATORY DATA:  EKG:  10/12/16  This demonstrates NSR.   Lab Results  Component Value Date   WBC 6.5  10/12/2017   HGB 13.4 10/12/2017   HCT 40.3 10/12/2017   PLT 309 10/12/2017   GLUCOSE 101 (H) 10/12/2017   CHOL 163 10/12/2017   TRIG 245 (H) 10/12/2017   HDL 54 10/12/2017   LDLCALC 75 10/12/2017   ALT 15 10/12/2017   AST 16 10/12/2017   NA 139 10/12/2017   K 4.2 10/12/2017   CL 103 10/12/2017   CREATININE 0.85 10/12/2017   BUN 11 10/12/2017   CO2 26 10/12/2017   TSH 1.25 10/12/2017   HGBA1C 5.4 10/12/2017   MICROALBUR 0.3 11/04/2015     BNP (last 3 results) No results for input(s): BNP in the last 8760 hours.  ProBNP (last 3 results) No results for input(s): PROBNP in the last 8760 hours.   Other Studies Reviewed Today:  Echo Study Conclusions from 2015  - Left ventricle: The cavity size was normal. Wall thickness was increased in a pattern of mild LVH. Systolic function was normal. The estimated ejection fraction was in the range of 60% to 65%. Wall motion was normal; there were no regional wall motion abnormalities. Features are consistent with a pseudonormal left ventricular filling pattern, with concomitant abnormal relaxation and increased filling pressure (grade 2 diastolic dysfunction). - Aortic valve: There was no stenosis. - Aorta: Ascending aortic diameter: 32mm (S). - Ascending aorta: The ascending aorta was mildly dilated. - Mitral valve: No significant regurgitation. - Left atrium: The atrium was mildly dilated. - Right ventricle: The cavity size was normal. Systolic function was normal. - Right atrium: The atrium was mildly dilated. - Tricuspid valve: Peak RV-RA gradient: 48mm Hg (S). - Pulmonary arteries: PA peak pressure: 40mm Hg (S). - Inferior vena cava: The vessel was normal in size; the respirophasic diameter changes were in the normal range (= 50%); findings are consistent with normal central venous pressure. Impressions:  - Normal LV size and systolic function, EF 78-29%. Mild LV hypertrophy. Moderate  diastolic dysfunction. Normal RV size and systolic function. No significant valvular abnormalities. Mildly dilated ascending aorta.   CTA CHEST MPRESSION 04/2016: Ascending thoracic aortic diameter measures 4.2 x 4.0 cm. Recommend annual imaging followup by CTA or MRA. This recommendation follows 2010 ACCF/AHA/AATS/ACR/ASA/SCA/SCAI/SIR/STS/SVM Guidelines for the Diagnosis and Management of Patients with Thoracic Aortic Disease. Circulation. 2010; 121: F621-H086. No thoracic aortic dissection.  No edema or consolidation. 3 mm nodular opacity right upper lobe anteriorly. No follow-up needed if patient is low-risk. Non-contrast chest CT can be considered in 12 months if patient is high-risk. This recommendation follows the consensus statement: Guidelines for Management of Incidental Pulmonary Nodules Detected on CT Images: From the Fleischner Society 2017; Radiology 2017; 284:228-243.  Assessment/Plan:  1. IBS:  Continue Bentyl f/u GI   2. Palpitations most recent event monitor 10/26/16 only benign PAC;s   3. Obesity - encouraged weight loss in the past - she does not exercise. Explained this is a risk factor for her AF.   4. History of bradycardia - stable on 1/2 dose atenolol. She does use extra prn.   5. Snoring - She has declinedsleep study in the past. Declined again today despite this being a risk factor for AF.   6. Dilated aorta - recent CTA  4.2 x 4.0 cm - no change on CTA done 04/11/17      Jenkins Rouge

## 2017-11-10 ENCOUNTER — Ambulatory Visit (INDEPENDENT_AMBULATORY_CARE_PROVIDER_SITE_OTHER): Payer: Medicare Other | Admitting: Cardiovascular Disease

## 2017-11-10 ENCOUNTER — Encounter: Payer: Self-pay | Admitting: Cardiovascular Disease

## 2017-11-10 VITALS — BP 118/84 | HR 53 | Ht 67.75 in | Wt 246.5 lb

## 2017-11-10 DIAGNOSIS — I712 Thoracic aortic aneurysm, without rupture, unspecified: Secondary | ICD-10-CM

## 2017-11-10 DIAGNOSIS — R002 Palpitations: Secondary | ICD-10-CM | POA: Diagnosis not present

## 2017-11-10 DIAGNOSIS — R001 Bradycardia, unspecified: Secondary | ICD-10-CM | POA: Diagnosis not present

## 2017-11-10 DIAGNOSIS — I10 Essential (primary) hypertension: Secondary | ICD-10-CM | POA: Diagnosis not present

## 2017-11-10 DIAGNOSIS — I480 Paroxysmal atrial fibrillation: Secondary | ICD-10-CM | POA: Diagnosis not present

## 2017-11-10 NOTE — Patient Instructions (Addendum)

## 2017-11-14 DIAGNOSIS — M1711 Unilateral primary osteoarthritis, right knee: Secondary | ICD-10-CM | POA: Diagnosis not present

## 2017-11-14 DIAGNOSIS — M1712 Unilateral primary osteoarthritis, left knee: Secondary | ICD-10-CM | POA: Diagnosis not present

## 2017-11-18 ENCOUNTER — Other Ambulatory Visit: Payer: Self-pay | Admitting: Cardiovascular Disease

## 2017-11-25 ENCOUNTER — Other Ambulatory Visit: Payer: Self-pay | Admitting: Internal Medicine

## 2017-12-21 ENCOUNTER — Encounter: Payer: Self-pay | Admitting: Internal Medicine

## 2018-01-08 ENCOUNTER — Other Ambulatory Visit: Payer: Self-pay | Admitting: Internal Medicine

## 2018-01-17 ENCOUNTER — Encounter: Payer: Self-pay | Admitting: Internal Medicine

## 2018-01-25 ENCOUNTER — Ambulatory Visit (INDEPENDENT_AMBULATORY_CARE_PROVIDER_SITE_OTHER): Payer: Medicare Other | Admitting: Internal Medicine

## 2018-01-25 ENCOUNTER — Encounter: Payer: Self-pay | Admitting: Internal Medicine

## 2018-01-25 VITALS — BP 136/80 | HR 60 | Temp 97.9°F | Resp 16 | Ht 67.0 in | Wt 250.6 lb

## 2018-01-25 DIAGNOSIS — R7309 Other abnormal glucose: Secondary | ICD-10-CM

## 2018-01-25 DIAGNOSIS — I10 Essential (primary) hypertension: Secondary | ICD-10-CM

## 2018-01-25 DIAGNOSIS — G4733 Obstructive sleep apnea (adult) (pediatric): Secondary | ICD-10-CM

## 2018-01-25 DIAGNOSIS — Z79899 Other long term (current) drug therapy: Secondary | ICD-10-CM

## 2018-01-25 DIAGNOSIS — R7303 Prediabetes: Secondary | ICD-10-CM

## 2018-01-25 DIAGNOSIS — Z1212 Encounter for screening for malignant neoplasm of rectum: Secondary | ICD-10-CM

## 2018-01-25 DIAGNOSIS — I480 Paroxysmal atrial fibrillation: Secondary | ICD-10-CM

## 2018-01-25 DIAGNOSIS — Z136 Encounter for screening for cardiovascular disorders: Secondary | ICD-10-CM

## 2018-01-25 DIAGNOSIS — Z8249 Family history of ischemic heart disease and other diseases of the circulatory system: Secondary | ICD-10-CM

## 2018-01-25 DIAGNOSIS — Z23 Encounter for immunization: Secondary | ICD-10-CM | POA: Diagnosis not present

## 2018-01-25 DIAGNOSIS — E782 Mixed hyperlipidemia: Secondary | ICD-10-CM

## 2018-01-25 DIAGNOSIS — E559 Vitamin D deficiency, unspecified: Secondary | ICD-10-CM | POA: Diagnosis not present

## 2018-01-25 DIAGNOSIS — Z87891 Personal history of nicotine dependence: Secondary | ICD-10-CM

## 2018-01-25 DIAGNOSIS — Z1211 Encounter for screening for malignant neoplasm of colon: Secondary | ICD-10-CM

## 2018-01-25 NOTE — Patient Instructions (Signed)

## 2018-01-25 NOTE — Progress Notes (Signed)
[facility] Unk Pinto, M.D.     Claudia [name]. Silverio Lay, P.A.-C Liane Comber, [place] [address] [place] [address] Telephone [phone] Telefax [phone] Annual Screening/Preventative Visit & Comprehensive Evaluation &  Examination     This very nice 67 y.o. MWF presents for a Screening /Preventative Visit & comprehensive evaluation and management of multiple medical co-morbidities.  Patient has been followed for HTN, HLD, Prediabetes  and Vitamin D Deficiency.      HTN predates since 1996. Patient's BP has been controlled at home and patient denies any cardiac symptoms as chest pain, palpitations, shortness of breath, dizziness or ankle swelling. Patient has hx/o pAfib followed by Dr [name] & she has declined anticoagulation. Today's BP is at goal - 136/80.      Patient's hyperlipidemia is controlled with diet and medications. Patient denies myalgias or other medication SE's. Last lipids were at goal albeit elevated Trig's: Lab Results  Component Value Date   CHOL 163 10/12/2017   HDL 54 10/12/2017   LDLCALC 75 10/12/2017   TRIG 245 (H) 10/12/2017   CHOLHDL 3.0 10/12/2017      Patient has hx/o Morbid Obesity (BMI 39+) and prediabetes (A1c 5.8%/2012) and patient denies reactive hypoglycemic symptoms, visual blurring, diabetic polys or paresthesias. Last A1c was at goal: Lab Results  Component Value Date   HGBA1C 5.4 10/12/2017      Finally, patient has history of Vitamin D Deficiency ("18"/2008)  and last Vitamin D was at goal: Lab Results  Component Value Date   VD25OH 66 05/25/2017   Current Outpatient Medications on File Prior to Visit  Medication Sig  . alendronate (FOSAMAX) 70 MG tablet Take 70 mg by mouth once a week. Take with a full glass of water on an empty stomach.  Marland Kitchen atenolol (TENORMIN) 50 MG tablet Take 0.5 tablets (25 mg total) daily by mouth. May take extra 1/2 tab prn  palpitations.  . Cholecalciferol (VITAMIN D-3 PO) Take 8,000 Units by mouth daily.   Marland Kitchen CINNAMON PO Take 1,000 mg by mouth 2 (two) times daily.  . diclofenac sodium (VOLTAREN) 1 % GEL Apply 4 g topically 4 (four) times daily.  Marland Kitchen dicyclomine (BENTYL) 10 MG capsule Take one capsule by mouth four times a day as needed for cramping/urgency  . DiphenhydrAMINE HCl (BENADRYL ALLERGY PO) Take by mouth as needed (allergies).   Marland Kitchen ELIQUIS 5 MG TABS tablet TAKE 1 TABLET TWICE A DAY  . Flaxseed, Linseed, (FLAX SEEDS PO) Take 1,000 mg by mouth 2 (two) times daily.  . hydrochlorothiazide (HYDRODIURIL) 25 MG tablet TAKE 1 TABLET DAILY  . PREMPRO 0.625-2.5 MG tablet TAKE AS DIRECTED  . quinapril (ACCUPRIL) 40 MG tablet TAKE 1 TABLET DAILY  . vitamin B-12 (CYANOCOBALAMIN) 1000 MCG tablet Take 1,000 mcg by mouth daily.   No current facility-administered medications on file prior to visit.    Allergies  Allergen Reactions  . Ciprofloxacin   . Levaquin [Levofloxacin In D5w]     No fluroquinolones due to ascending thoracic aortic aneurysm  . Penicillins Other (See Comments)    Had as a child=unknown reaction  . Zoloft [Sertraline Hcl]    Past Medical History:  Diagnosis Date  . Achilles tendonitis    R heal, limits activity  . Allergy   . Anxiety   . Elevated hemoglobin A1c   . Hyperlipidemia   . Hypertension   . IBS (irritable bowel syndrome)   . Insomnia   .  Obesity   . Palpitations   . Vitamin D deficiency    Health Maintenance  Topic Date Due  . PAP SMEAR  10/10/1973  . PNA vac Low Risk Adult (1 of 2 - PCV13) 10/10/2017  . INFLUENZA VACCINE  11/03/2017  . MAMMOGRAM  12/29/2018  . COLONOSCOPY  11/04/2022  . TETANUS/TDAP  10/26/2026  . DEXA SCAN  Completed  . Hepatitis C Screening  Completed  . HIV Screening  Completed   Immunization History  Administered Date(s) Administered  . Influenza Split 02/19/2013  . Influenza, Seasonal, Injecte, Preservative Fre 03/12/2015  .  Influenza-Unspecified 04/06/2011  . PPD Test 10/15/2013  . Pneumococcal-Unspecified 04/06/1995  . Td 10/25/2016  . Tdap 04/05/2006  . Zoster 07/04/2013   Last Colon - 08/134/2014 - Dr Delfin Edis - due Aug 2014  Last MGM -  12/28/2016 & patient is aware to reschedule  Past Surgical History:  Procedure Laterality Date  . CESAREAN SECTION    . CESAREAN SECTION    . CHOLECYSTECTOMY  10 years ago  . TUBAL LIGATION Bilateral    Family History  Problem Relation Age of Onset  . Heart attack Mother   . Stroke Mother   . Heart disease Mother   . Diabetes Mother   . Macular degeneration Mother   . Heart attack Father   . Alcohol abuse Father   . Ovarian cancer Maternal Grandmother   . Colon cancer Neg Hx    Social History   Tobacco Use  . Smoking status: Former Smoker    Last attempt to quit: 04/01/2004    Years since quitting: 13.8  . Smokeless tobacco: Never Used  . Tobacco comment: Social smoker times 10 years  Substance Use Topics  . Alcohol use: Yes    Alcohol/week: 0.0 standard drinks    Comment: rarely, < 1 beer per week  . Drug use: No    ROS Constitutional: Denies fever, chills, weight loss/gain, headaches, insomnia,  night sweats, and change in appetite. Does c/o fatigue. Eyes: Denies redness, blurred vision, diplopia, discharge, itchy, watery eyes.  ENT: Denies discharge, congestion, post nasal drip, epistaxis, sore throat, earache, hearing loss, dental pain, Tinnitus, Vertigo, Sinus pain, snoring.  Cardio: Denies chest pain, palpitations, irregular heartbeat, syncope, dyspnea, diaphoresis, orthopnea, PND, claudication, edema Respiratory: denies cough, dyspnea, DOE, pleurisy, hoarseness, laryngitis, wheezing.  Gastrointestinal: Denies dysphagia, heartburn, reflux, water brash, pain, cramps, nausea, vomiting, bloating, diarrhea, constipation, hematemesis, melena, hematochezia, jaundice, hemorrhoids Genitourinary: Denies dysuria, frequency, urgency, nocturia,  hesitancy, discharge, hematuria, flank pain Breast: Breast lumps, nipple discharge, bleeding.  Musculoskeletal: Denies arthralgia, myalgia, stiffness, Jt. Swelling, pain, limp, and strain/sprain. Denies falls. Skin: Denies puritis, rash, hives, warts, acne, eczema, changing in skin lesion Neuro: No weakness, tremor, incoordination, spasms, paresthesia, pain Psychiatric: Denies confusion, memory loss, sensory loss. Denies Depression. Endocrine: Denies change in weight, skin, hair change, nocturia, and paresthesia, diabetic polys, visual blurring, hyper / hypo glycemic episodes.  Heme/Lymph: No excessive bleeding, bruising, enlarged lymph nodes.  Physical Exam  BP 136/80   Pulse 60   Temp 97.9 F (36.6 C)   Resp 16   Ht 5\' 7"  (1.702 m)   Wt 250 lb 9.6 oz (113.7 kg)   BMI 39.25 kg/m   General Appearance: Over nourished, well groomed and in no apparent distress.  Eyes: PERRLA, EOMs, conjunctiva no swelling or erythema, normal fundi and vessels. Sinuses: No frontal/maxillary tenderness ENT/Mouth: EACs patent / TMs  nl. Nares clear without erythema, swelling, mucoid exudates. Oral hygiene is  good. No erythema, swelling, or exudate. Tongue normal, non-obstructing. Tonsils not swollen or erythematous. Hearing normal.  Neck: Supple, thyroid not palpable. No bruits, nodes or JVD. Respiratory: Respiratory effort normal.  BS equal and clear bilateral without rales, rhonci, wheezing or stridor. Cardio: Heart sounds are normal with regular rate and rhythm and no murmurs, rubs or gallops. Peripheral pulses are normal and equal bilaterally without edema. No aortic or femoral bruits. Chest: symmetric with normal excursions and percussion. Breasts: Symmetric, without lumps, nipple discharge, retractions, or fibrocystic changes.  Abdomen: Flat, soft with bowel sounds active. Nontender, no guarding, rebound, hernias, masses, or organomegaly.  Lymphatics: Non tender without lymphadenopathy.   Genitourinary:  Musculoskeletal: Full ROM all peripheral extremities, joint stability, 5/5 strength, and normal gait. Skin: Warm and dry without rashes, lesions, cyanosis, clubbing or  ecchymosis.  Neuro: Cranial nerves intact, reflexes equal bilaterally. Normal muscle tone, no cerebellar symptoms. Sensation intact.  Pysch: Alert and oriented X 3, normal affect, Insight and Judgment appropriate.   Assessment and Plan  1. Essential hypertension  - EKG 12-Lead - Microalbumin / creatinine urine ratio - Urinalysis, Routine w reflex microscopic - CBC with Differential/Platelet - COMPLETE METABOLIC PANEL WITH GFR - Magnesium - TSH  2. Hyperlipidemia, mixed  - EKG 12-Lead - Lipid panel - TSH  3. Abnormal glucose  - EKG 12-Lead - Hemoglobin A1c - Insulin, random  4. Vitamin D deficiency  - VITAMIN D 25 Hydroxyl  5. Prediabetes  - Hemoglobin A1c - Insulin, random  6. Paroxysmal atrial fibrillation (HCC) - EKG 12-Lead - TSH  7. OSA (obstructive sleep apnea)  8. Screening for ischemic heart disease  - EKG 12-Lead  9. FHx: heart disease  - EKG 12-Lead  10. Former smoker  - EKG 12-Lead  11. Medication management  - Microalbumin / creatinine urine ratio - Urinalysis, Routine w reflex microscopic - CBC with Differential/Platelet - COMPLETE METABOLIC PANEL WITH GFR - Magnesium - Lipid panel - TSH - Hemoglobin A1c - Insulin, random - VITAMIN D 25 Hydroxyl  12. Screening for colorectal cancer  - POC Hemoccult Bld/Stl  13. Need for prophylactic vaccination and inoculation against influenza  - Flu vaccine HIGH DOSE PF (Fluzone High dose)  14. Need for prophylactic vaccination against Streptococcus pneumoniae (pneumococcus)  - Pneumococcal conjugate vaccine 13-valent         Patient was counseled in prudent diet to achieve/maintain BMI less than 25 for weight control, BP monitoring, regular exercise and medications. Discussed med's effects and SE's.  Screening labs and tests as requested with regular follow-up as recommended. Over 40 minutes of exam, counseling, chart review and high complex critical decision making was performed.

## 2018-01-26 LAB — CBC WITH DIFFERENTIAL/PLATELET
BASOS PCT: 0.7 %
Basophils Absolute: 60 cells/uL (ref 0–200)
Eosinophils Absolute: 189 cells/uL (ref 15–500)
Eosinophils Relative: 2.2 %
HEMATOCRIT: 40.8 % (ref 35.0–45.0)
Hemoglobin: 13.9 g/dL (ref 11.7–15.5)
LYMPHS ABS: 2270 {cells}/uL (ref 850–3900)
MCH: 29.6 pg (ref 27.0–33.0)
MCHC: 34.1 g/dL (ref 32.0–36.0)
MCV: 87 fL (ref 80.0–100.0)
MPV: 11.2 fL (ref 7.5–12.5)
Monocytes Relative: 10.3 %
NEUTROS PCT: 60.4 %
Neutro Abs: 5194 cells/uL (ref 1500–7800)
PLATELETS: 356 10*3/uL (ref 140–400)
RBC: 4.69 10*6/uL (ref 3.80–5.10)
RDW: 12.1 % (ref 11.0–15.0)
Total Lymphocyte: 26.4 %
WBC: 8.6 10*3/uL (ref 3.8–10.8)
WBCMIX: 886 {cells}/uL (ref 200–950)

## 2018-01-26 LAB — INSULIN, RANDOM: INSULIN: 3.4 u[IU]/mL (ref 2.0–19.6)

## 2018-01-26 LAB — COMPLETE METABOLIC PANEL WITH GFR
AG RATIO: 1.3 (calc) (ref 1.0–2.5)
ALBUMIN MSPROF: 4.2 g/dL (ref 3.6–5.1)
ALKALINE PHOSPHATASE (APISO): 60 U/L (ref 33–130)
ALT: 11 U/L (ref 6–29)
AST: 13 U/L (ref 10–35)
BUN: 14 mg/dL (ref 7–25)
CO2: 25 mmol/L (ref 20–32)
CREATININE: 0.86 mg/dL (ref 0.50–0.99)
Calcium: 9.7 mg/dL (ref 8.6–10.4)
Chloride: 100 mmol/L (ref 98–110)
GFR, EST NON AFRICAN AMERICAN: 71 mL/min/{1.73_m2} (ref >=60)
GFR, Est African American: 82 mL/min/{1.73_m2} (ref >=60)
Globulin: 3.2 g/dL (calc) (ref 1.9–3.7)
Glucose, Bld: 85 mg/dL (ref 65–99)
POTASSIUM: 4.1 mmol/L (ref 3.5–5.3)
SODIUM: 137 mmol/L (ref 135–146)
Total Bilirubin: 0.5 mg/dL (ref 0.2–1.2)
Total Protein: 7.4 g/dL (ref 6.1–8.1)

## 2018-01-26 LAB — MICROALBUMIN / CREATININE URINE RATIO
Creatinine, Urine: 119 mg/dL (ref 20–275)
Microalb, Ur: <0.2 mg/dL

## 2018-01-26 LAB — URINALYSIS, ROUTINE W REFLEX MICROSCOPIC
BILIRUBIN URINE: NEGATIVE
Glucose, UA: NEGATIVE
Hgb urine dipstick: NEGATIVE
Ketones, ur: NEGATIVE
LEUKOCYTES UA: NEGATIVE
Nitrite: NEGATIVE
PROTEIN: NEGATIVE
Specific Gravity, Urine: 1.017 (ref 1.001–1.03)
pH: 6 (ref 5.0–8.0)

## 2018-01-26 LAB — LIPID PANEL
CHOLESTEROL: 187 mg/dL (ref <200)
HDL: 55 mg/dL (ref >50)
LDL Cholesterol (Calc): 97 mg/dL (calc)
Non-HDL Cholesterol (Calc): 132 mg/dL (calc) — ABNORMAL HIGH (ref <130)
Total CHOL/HDL Ratio: 3.4 (calc) (ref <5.0)
Triglycerides: 248 mg/dL — ABNORMAL HIGH (ref <150)

## 2018-01-26 LAB — HEMOGLOBIN A1C
EAG (MMOL/L): 6.3 (calc)
HEMOGLOBIN A1C: 5.6 %{Hb} (ref <5.7)
MEAN PLASMA GLUCOSE: 114 (calc)

## 2018-01-26 LAB — VITAMIN D 25 HYDROXY (VIT D DEFICIENCY, FRACTURES): VIT D 25 HYDROXY: 61 ng/mL (ref 30–100)

## 2018-01-26 LAB — MAGNESIUM: MAGNESIUM: 2 mg/dL (ref 1.5–2.5)

## 2018-01-26 LAB — TSH: TSH: 1.7 mIU/L (ref 0.40–4.50)

## 2018-04-08 ENCOUNTER — Other Ambulatory Visit: Payer: Self-pay | Admitting: Adult Health

## 2018-05-03 ENCOUNTER — Other Ambulatory Visit: Payer: Self-pay | Admitting: Cardiovascular Disease

## 2018-05-04 NOTE — Progress Notes (Signed)
WELCOME TO MEDICARE ANNUAL WELLNESS VISIT AND FOLLOW UP  Assessment:    Encounter for Medicare annual wellness exam 1 year Get MGM/DEXA Defer EKG and aorta  Paroxysmal atrial fibrillation ([place]) Follows with cardio, on eliquis  SVT (supraventricular tachycardia) ([place]) Follows with cardio, on eliquis  Thoracic aortic aneurysm without rupture ([place]) Follows with cardio  Morbid obesity ([place]) - follow up 3 months for progress monitoring - increase veggies, decrease carbs - long discussion about weight loss, diet, and exercise  Mixed hyperlipidemia check lipids decrease fatty foods increase activity.  -     Lipid panel  Medication management -     CBC with Differential/Platelet -     COMPLETE METABOLIC PANEL WITH GFR -     Magnesium  Vitamin D deficiency Continue supplement  Abnormal glucose Discussed disease progression and risks Discussed diet/exercise, weight management and risk modification -     Hemoglobin A1c  Insomnia, unspecified type Continue follow up  Irritable bowel syndrome, unspecified type Can continue imodium She is up to date on colonoscopy -     Hyoscyamine Sulfate SL (LEVSIN/SL) 0.125 MG SUBL; Place 0.125 mg under the tongue every 4 (four) hours as needed (diarrhea).  Essential hypertension - continue medications, DASH diet, exercise and monitor at home. Call if greater than 130/80.  -     CBC with Differential/Platelet -     COMPLETE METABOLIC PANEL WITH GFR -     TSH  Localized osteoporosis without current pathological fracture -     DG Bone Density; Future - needs follow up continue fosamax    Over 40 minutes of exam, counseling, chart review and critical decision making was performed Future Appointments  Date Time Provider [place]  08/07/2018  2:30 PM Unk Pinto, MD [facility] None  02/27/2019  3:00 PM Unk Pinto, MD [facility] None     Plan:   During the course of the visit the patient was educated and  counseled about appropriate screening and preventive services including:    Pneumococcal vaccine   Prevnar 13  Influenza vaccine  Td vaccine  Screening electrocardiogram  Bone densitometry screening  Colorectal cancer screening  Diabetes screening  Glaucoma screening  Nutrition counseling   Advanced directives: requested   Subjective:  Claudia [name] is a 68 y.o. female who presents for Medicare Annual Wellness Visit and 3 month follow up.    Her blood pressure has been controlled at home, today their BP is BP: 128/84 She does workout, 3 days a week, does seated. She denies chest pain, shortness of breath, dizziness.  Patient has history of pAfib, followed by Dr. [name], she is on eliquis for over a year. Mom had a stroke.   She can not take NSAIDS due to elliquis use, has bilateral knee OA, has had injections, asking about possible meds for trips when she travels. Discussed able to do acute RX for tramadol before a trip.   She has osteoporosis, started on fosamax x 01/2018  She will have a BM 2-3 x a day and will have urgency with it. Her husband is now retired and she would like to travel more. She will take imodium 2 tablets before trips and do well with it.   BMI is Body mass index is 38.33 kg/m., she is working on diet and exercise. She has joined [employer] 2 weeks ago, her and her husband [employer] from Last 3 Encounters:  05/08/18 248 lb 6.4 oz (112.7 kg)  01/25/18 250 lb 9.6 oz (113.7  kg)  11/10/17 246 lb 8 oz (111.8 kg)   She is not on cholesterol medication and denies myalgias. Her cholesterol is at goal. The cholesterol last visit was:   Lab Results  Component Value Date   CHOL 187 01/25/2018   HDL 55 01/25/2018   LDLCALC 97 01/25/2018   TRIG 248 (H) 01/25/2018   CHOLHDL 3.4 01/25/2018   . She has been working on diet and exercise for prediabetes, and denies paresthesia of the feet, polydipsia and polyuria. Last A1C in the office was:  Lab  Results  Component Value Date   HGBA1C 5.6 01/25/2018   Last GFR: Lab Results  Component Value Date   GFRNONAA 71 01/25/2018   Patient is on Vitamin D supplement.   Lab Results  Component Value Date   VD25OH 61 01/25/2018      Medication Review:  Current Outpatient Medications (Endocrine & Metabolic):  .  alendronate (FOSAMAX) 70 MG tablet, Take 70 mg by mouth once a week. Take with a full glass of water on an empty stomach. Marland Kitchen  PREMPRO 0.625-2.5 MG tablet, TAKE AS DIRECTED  Current Outpatient Medications (Cardiovascular):  .  atenolol (TENORMIN) 50 MG tablet, TAKE ONE-HALF TABLET (25 MG TOTAL) DAILY. MAY TAKE EXTRA ONE-HALF TABLET (25 MG) AS NEEDED FOR PALPITATIONS .  hydrochlorothiazide (HYDRODIURIL) 25 MG tablet, TAKE 1 TABLET DAILY .  quinapril (ACCUPRIL) 40 MG tablet, TAKE 1 TABLET DAILY  Current Outpatient Medications (Respiratory):  Marland Kitchen  DiphenhydrAMINE HCl (BENADRYL ALLERGY PO), Take by mouth as needed (allergies).    Current Outpatient Medications (Hematological):  Marland Kitchen  ELIQUIS 5 MG TABS tablet, TAKE 1 TABLET TWICE A DAY .  vitamin B-12 (CYANOCOBALAMIN) 1000 MCG tablet, Take 1,000 mcg by mouth daily.  Current Outpatient Medications (Other):  Marland Kitchen  Cholecalciferol (VITAMIN D-3 PO), Take 8,000 Units by mouth daily.  Marland Kitchen  CINNAMON PO, Take 1,000 mg by mouth 2 (two) times daily. .  diclofenac sodium (VOLTAREN) 1 % GEL, Apply 4 g topically 4 (four) times daily. Marland Kitchen  dicyclomine (BENTYL) 10 MG capsule, Take one capsule by mouth four times a day as needed for cramping/urgency .  Flaxseed, Linseed, (FLAX SEEDS PO), Take 1,000 mg by mouth 2 (two) times daily. Marland Kitchen  Hyoscyamine Sulfate SL (LEVSIN/SL) 0.125 MG SUBL, Place 0.125 mg under the tongue every 4 (four) hours as needed (diarrhea).  Allergies Allergies  Allergen Reactions  . Ciprofloxacin   . Levaquin [Levofloxacin In D5w]     No fluroquinolones due to ascending thoracic aortic aneurysm  . Penicillins Other (See Comments)     Had as a child=unknown reaction  . Zoloft [Sertraline Hcl]     Current Problems (verified) Patient Active Problem List   Diagnosis Date Noted  . Osteoporosis 05/08/2018  . DJD (degenerative joint disease) 05/08/2018  . Thoracic aortic aneurysm ([place]) 08/13/2016  . Morbid obesity ([place]) 03/16/2015  . Atrial fibrillation ([place]) 10/29/2013  . SVT (supraventricular tachycardia) ([place]) 10/29/2013  . Medication management 02/18/2013  . Hypertension   . Hyperlipidemia   . Abnormal glucose   . Vitamin D deficiency   . IBS (irritable bowel syndrome)   . Insomnia     Screening Tests Immunization History  Administered Date(s) Administered  . Influenza Split 02/19/2013  . Influenza, High Dose Seasonal PF 01/25/2018  . Influenza, Seasonal, Injecte, Preservative Fre 03/12/2015  . Influenza,inj,Quad PF,6+ Mos 01/27/2017  . Influenza-Unspecified 04/06/2011  . PPD Test 10/15/2013  . Pneumococcal Conjugate-13 01/25/2018  . Pneumococcal-Unspecified 04/06/1995  . Td 10/25/2016  .  Tdap 04/05/2006  . Zoster 07/04/2013   Preventative care: Last colonoscopy: 2014 Last mammogram: 12/2016 OVERDUE Last pap smear/pelvic exam: declines   DEXA:12/2016 DUE Stress test 2018 Dr. [name], low risk study EF 86% Echo 2015  Prior vaccinations: TD or Tdap: 2018  Influenza: 2019 Pneumococcal: 1997 Prevnar13: 2019 Shingles/Zostavax: 2015  Names of Other Physician/Practitioners you currently use: 1. [facility] here for primary care 2. Dr. Sherlean Foot, eye doctor, last visit 2018 3. [facility], Dr. Dayna Barker, dentist, last visit 2018 Patient Care Team: Unk Pinto, MD as PCP - General (Internal Medicine)  SURGICAL HISTORY She  has a past surgical history that includes Cesarean section; Cholecystectomy (10 years ago); Tubal ligation (Bilateral); and Cesarean section. FAMILY HISTORY Her family history includes Alcohol abuse in her father; Diabetes in her mother;  Heart attack in her father and mother; Heart disease in her mother; Macular degeneration in her mother; Ovarian cancer in her maternal grandmother; Stroke in her mother. SOCIAL HISTORY She  reports that she quit smoking about 14 years ago. She has never used smokeless tobacco. She reports current alcohol use. She reports that she does not use drugs.   MEDICARE WELLNESS OBJECTIVES: Physical activity: Current Exercise Habits: Home exercise routine, Type of exercise: Other - see comments(seated exercises. ), Time (Minutes): 30, Frequency (Times/Week): 3, Weekly Exercise (Minutes/Week): 90, Intensity: Mild Cardiac risk factors: Cardiac Risk Factors include: advanced age (>60men, >34 women);dyslipidemia;obesity (BMI >30kg/m2);hypertension Depression/mood screen:   Depression screen [facility] 2/9 05/08/2018  Decreased Interest 0  Down, Depressed, Hopeless 0  PHQ - 2 Score 0    ADLs:  In your present state of health, do you have any difficulty performing the following activities: 05/08/2018 01/28/2018  Hearing? N N  Vision? N N  Difficulty concentrating or making decisions? N N  Walking or climbing stairs? N N  Dressing or bathing? N N  Doing errands, shopping? N N  Some recent data might be hidden     Cognitive Testing  Alert? Yes  Normal Appearance?Yes  Oriented to person? Yes  Place? Yes   Time? Yes  Recall of three objects?  Yes  Can perform simple calculations? Yes  Displays appropriate judgment?Yes  Can read the correct time from a watch face?Yes  EOL planning: Does Patient Have a Medical Advance Directive?: No Does patient want to make changes to medical advance directive?: Yes (MAU/Ambulatory/Procedural Areas - Information given)  ROS   Objective:     Today's Vitals   05/08/18 1411  BP: 128/84  Pulse: (!) 55  Temp: (!) 97.4 F (36.3 C)  SpO2: 98%  Weight: 248 lb 6.4 oz (112.7 kg)  Height: 5' 7.5" (1.715 m)   Body mass index is 38.33 kg/m.  General appearance: alert, no  distress, WD/WN, female HEENT: normocephalic, sclerae anicteric, TMs pearly, nares patent, no discharge or erythema, pharynx normal Oral cavity: MMM, no lesions Neck: supple, no lymphadenopathy, no thyromegaly, no masses Heart: RRR, normal S1, S2, no murmurs Lungs: CTA bilaterally, no wheezes, rhonchi, or rales Abdomen: +bs, soft, non tender, non distended, no masses, no hepatomegaly, no splenomegaly Musculoskeletal: nontender, no swelling, no obvious deformity Extremities: no edema, no cyanosis, no clubbing Pulses: 2+ symmetric, upper and lower extremities, normal cap refill Neurological: alert, oriented x 3, CN2-12 intact, strength normal upper extremities and lower extremities, sensation normal throughout, DTRs 2+ throughout, no cerebellar signs, gait normal Psychiatric: normal affect, behavior normal, pleasant   Medicare Attestation I have personally reviewed: The patient's medical and  social history Their use of alcohol, tobacco or illicit drugs Their current medications and supplements The patient's functional ability including ADLs,fall risks, home safety risks, cognitive, and hearing and visual impairment Diet and physical activities Evidence for depression or mood disorders  The patient's weight, height, BMI, and visual acuity have been recorded in the chart.  I have made referrals, counseling, and provided education to the patient based on review of the above and I have provided the patient with a written personalized care plan for preventive services.     Vicie Mutters, PA-C   05/08/2018

## 2018-05-08 ENCOUNTER — Encounter: Payer: Self-pay | Admitting: Physician Assistant

## 2018-05-08 ENCOUNTER — Ambulatory Visit (INDEPENDENT_AMBULATORY_CARE_PROVIDER_SITE_OTHER): Payer: Medicare Other | Admitting: Physician Assistant

## 2018-05-08 VITALS — BP 128/84 | HR 55 | Temp 97.4°F | Ht 67.5 in | Wt 248.4 lb

## 2018-05-08 DIAGNOSIS — Z79899 Other long term (current) drug therapy: Secondary | ICD-10-CM

## 2018-05-08 DIAGNOSIS — Z0001 Encounter for general adult medical examination with abnormal findings: Secondary | ICD-10-CM

## 2018-05-08 DIAGNOSIS — K589 Irritable bowel syndrome without diarrhea: Secondary | ICD-10-CM

## 2018-05-08 DIAGNOSIS — R6889 Other general symptoms and signs: Secondary | ICD-10-CM | POA: Diagnosis not present

## 2018-05-08 DIAGNOSIS — M816 Localized osteoporosis [Lequesne]: Secondary | ICD-10-CM

## 2018-05-08 DIAGNOSIS — R7309 Other abnormal glucose: Secondary | ICD-10-CM

## 2018-05-08 DIAGNOSIS — E782 Mixed hyperlipidemia: Secondary | ICD-10-CM | POA: Diagnosis not present

## 2018-05-08 DIAGNOSIS — I712 Thoracic aortic aneurysm, without rupture, unspecified: Secondary | ICD-10-CM

## 2018-05-08 DIAGNOSIS — Z0000 Encounter for general adult medical examination without abnormal findings: Secondary | ICD-10-CM

## 2018-05-08 DIAGNOSIS — M170 Bilateral primary osteoarthritis of knee: Secondary | ICD-10-CM

## 2018-05-08 DIAGNOSIS — M1990 Unspecified osteoarthritis, unspecified site: Secondary | ICD-10-CM | POA: Insufficient documentation

## 2018-05-08 DIAGNOSIS — E559 Vitamin D deficiency, unspecified: Secondary | ICD-10-CM

## 2018-05-08 DIAGNOSIS — I480 Paroxysmal atrial fibrillation: Secondary | ICD-10-CM

## 2018-05-08 DIAGNOSIS — I471 Supraventricular tachycardia: Secondary | ICD-10-CM | POA: Diagnosis not present

## 2018-05-08 DIAGNOSIS — I10 Essential (primary) hypertension: Secondary | ICD-10-CM | POA: Diagnosis not present

## 2018-05-08 DIAGNOSIS — G4700 Insomnia, unspecified: Secondary | ICD-10-CM | POA: Diagnosis not present

## 2018-05-08 DIAGNOSIS — M810 Age-related osteoporosis without current pathological fracture: Secondary | ICD-10-CM | POA: Insufficient documentation

## 2018-05-08 MED ORDER — HYOSCYAMINE SULFATE SL 0.125 MG SL SUBL: 0.1250 mg | SUBLINGUAL_TABLET | SUBLINGUAL | 0 refills | Status: DC | PRN

## 2018-05-08 NOTE — Patient Instructions (Addendum)
HOW TO SCHEDULE A MAMMOGRAM  [facility]  7 a.m.-6:30 p.m., Monday 7 a.m.-5 p.m., Tuesday-Friday Schedule an appointment by calling [phone].   Ask insurance and pharmacy about shingrix - new vaccine   Can go to [email or web address] for more information  Shingrix Vaccination  Two vaccines are licensed and recommended to prevent shingles in the [place] Zoster vaccine live (ZVL, Zostavax) has been in use since 2006. Recombinant zoster vaccine (RZV, Shingrix), has been in use since 2017 and is recommended by ACIP as the preferred shingles vaccine.  What Everyone Should Know about Shingles Vaccine (Shingrix) One of the Recommended Vaccines by Disease Shingles vaccination is the only way to protect against shingles and postherpetic neuralgia (PHN), the most common complication from shingles. CDC recommends that healthy adults 50 years and older get two doses of the shingles vaccine called Shingrix (recombinant zoster vaccine), separated by 2 to 6 months, to prevent shingles and the complications from the disease. Your doctor or pharmacist can give you Shingrix as a shot in your upper arm. Shingrix provides strong protection against shingles and PHN. Two doses of Shingrix is more than 90% effective at preventing shingles and PHN. Protection stays above 85% for at least the first four years after you get vaccinated. Shingrix is the preferred vaccine, over Zostavax (zoster vaccine live), a shingles vaccine in use since 2006. Zostavax may still be used to prevent shingles in healthy adults 60 years and older. For example, you could use Zostavax if a person is allergic to Shingrix, prefers Zostavax, or requests immediate vaccination and Shingrix is unavailable. Who Should Get Shingrix? Healthy adults 50 years and older should get two doses of Shingrix, separated by 2 to 6 months. You should get Shingrix even if in the past  you . had shingles  . received Zostavax  . are not sure if you had chickenpox There is no maximum age for getting Shingrix. If you had shingles in the past, you can get Shingrix to help prevent future occurrences of the disease. There is no specific length of time that you need to wait after having shingles before you can receive Shingrix, but generally you should make sure the shingles rash has gone away before getting vaccinated. You can get Shingrix whether or not you remember having had chickenpox in the past. Studies show that more than 99% of Americans 40 years and older have had chickenpox, even if they don't remember having the disease. Chickenpox and shingles are related because they are caused by the same virus (varicella zoster virus). After a person recovers from chickenpox, the virus stays dormant (inactive) in the body. It can reactivate years later and cause shingles. If you had Zostavax in the recent past, you should wait at least eight weeks before getting Shingrix. Talk to your healthcare provider to determine the best time to get Shingrix. Shingrix is available in [facility] and pharmacies. To find doctor's offices or pharmacies near you that offer the vaccine, visit HealthMap Vaccine FinderExternal. If you have questions about Shingrix, talk with your healthcare provider. Vaccine for Those 88 Years and Older  Shingrix reduces the risk of shingles and PHN by more than 90% in people 33 and older. CDC recommends the vaccine for healthy adults 58 and older.  Who Should Not Get Shingrix? You should not get Shingrix if you: . have ever had a severe allergic reaction to any component of the vaccine or after a dose of Shingrix  . tested negative  for immunity to varicella zoster virus. If you test negative, you should get chickenpox vaccine.  . currently have shingles  . currently are pregnant or breastfeeding. Women who are pregnant or breastfeeding should wait to get Shingrix.   Marland Kitchen receive specific antiviral drugs (acyclovir, famciclovir, or valacyclovir) 24 hours before vaccination (avoid use of these antiviral drugs for 14 days after vaccination)- zoster vaccine live only If you have a minor acute (starts suddenly) illness, such as a cold, you may get Shingrix. But if you have a moderate or severe acute illness, you should usually wait until you recover before getting the vaccine. This includes anyone with a temperature of 101.19F or higher. The side effects of the Shingrix are temporary, and usually last 2 to 3 days. While you may experience pain for a few days after getting Shingrix, the pain will be less severe than having shingles and the complications from the disease. How Well Does Shingrix Work? Two doses of Shingrix provides strong protection against shingles and postherpetic neuralgia (PHN), the most common complication of shingles. . In adults 59 to 66 years old who got two doses, Shingrix was 97% effective in preventing shingles; among adults 70 years and older, Shingrix was 91% effective.  . In adults 51 to 66 years old who got two doses, Shingrix was 91% effective in preventing PHN; among adults 70 years and older, Shingrix was 89% effective. Shingrix protection remained high (more than 85%) in people 70 years and older throughout the four years following vaccination. Since your risk of shingles and PHN increases as you get older, it is important to have strong protection against shingles in your older years. Top of Page  What Are the Possible Side Effects of Shingrix? Studies show that Shingrix is safe. The vaccine helps your body create a strong defense against shingles. As a result, you are likely to have temporary side effects from getting the shots. The side effects may affect your ability to do normal daily activities for 2 to 3 days. Most people got a sore arm with mild or moderate pain after getting Shingrix, and some also had redness and swelling where  they got the shot. Some people felt tired, had muscle pain, a headache, shivering, fever, stomach pain, or nausea. About 1 out of 6 people who got Shingrix experienced side effects that prevented them from doing regular activities. Symptoms went away on their own in about 2 to 3 days. Side effects were more common in younger people. You might have a reaction to the first or second dose of Shingrix, or both doses. If you experience side effects, you may choose to take over-the-counter pain medicine such as ibuprofen or acetaminophen. If you experience side effects from Shingrix, you should report them to the Vaccine Adverse Event Reporting System (VAERS). Your doctor might file this report, or you can do it yourself through the VAERS websiteExternal, or by calling [phone]. If you have any questions about side effects from Shingrix, talk with your doctor. The shingles vaccine does not contain thimerosal (a preservative containing mercury). Top of Page  When Should I See a Doctor Because of the Side Effects I Experience From Shingrix? In clinical trials, Shingrix was not associated with serious adverse events. In fact, serious side effects from vaccines are extremely rare. For example, for every 1 million doses of a vaccine given, only one or two people may have a severe allergic reaction. Signs of an allergic reaction happen within minutes or hours after vaccination and  include hives, swelling of the face and throat, difficulty breathing, a fast heartbeat, dizziness, or weakness. If you experience these or any other life-threatening symptoms, see a doctor right away. Shingrix causes a strong response in your immune system, so it may produce short-term side effects more intense than you are used to from other vaccines. These side effects can be uncomfortable, but they are expected and usually go away on their own in 2 or 3 days. Top of Page  How Can I Pay For Shingrix? There are several ways shingles  vaccine may be paid for: Medicare . Medicare Part D plans cover the shingles vaccine, but there may be a cost to you depending on your plan. There may be a copay for the vaccine, or you may need to pay in full then get reimbursed for a certain amount.  . Medicare Part B does not cover the shingles vaccine. Medicaid . Medicaid may or may not cover the vaccine. Contact your insurer to find out. Private health insurance . Many private health insurance plans will cover the vaccine, but there may be a cost to you depending on your plan. Contact your insurer to find out. Vaccine assistance programs . Some pharmaceutical companies provide vaccines to eligible adults who cannot afford them. You may want to check with the vaccine manufacturer, [employer], about Shingrix. If you do not currently have health insurance, learn more about affordable health coverage optionsExternal. To find doctor's offices or pharmacies near you that offer the vaccine, visit HealthMap Vaccine FinderExternal.   Google mindful eating and here are some tips and tricks below.   Rate your hunger before you eat on a scale of 1-10, try to eat closer to a 6 or higher. And if you are at below that, why are you eating? Slow down and listen to your body.       Tarsal Tunnel Syndrome  Tarsal tunnel syndrome is a condition that happens when a nerve (tibial nerve) is irritated or squeezed (compressed) as it passes through an area on the inside of your ankle (tarsal tunnel). The tarsal tunnel is a narrow passage through the connective tissue and bones in your feet (tarsals). The tibial nerve passes behind the large bony bump at your inner ankle (medial malleolus) and sends branches to your foot and toes. This nerve helps supply feeling to your heel, to the bottom of your foot, and to some of your toe muscles. Tarsal tunnel syndrome usually causes ankle and foot pain that gets worse with activity. What are the causes? Tarsal  tunnel syndrome can be caused by any condition that narrows the space in the tarsal tunnel. Athletes may get tarsal tunnel syndrome from a fractured ankle or from an outward (eversion) ankle sprain that results in scarring or swelling. Other common causes include:  Over-pronation. This is when your feet roll in and flatten too much when you stand, walk, or run.  Extra pressure on the tarsal tunnel area from tight or stiff shoes or boots.  Decreased room in the tarsal tunnel due to small, fluid-filled sacs (cysts) or growths on the bones near the tunnel (exostosis). What increases the risk? This condition is more likely to develop in people who:  Play sports where they wear high, stiff boots, such as downhill skiing.  Play sports with repetitive motion, such as running.  Play sports on uneven surfaces that can lead to a sprained ankle, such as soccer or football.  Have had an inner ankle injury.  Have flat  feet.  Have a condition such as diabetes, hypothyroidism, or rheumatoid arthritis. What are the signs or symptoms? Symptoms of this condition include:  Burning pain behind the ankle, in the heel, or in the foot that gets worse if you are standing, walking, or running.  Numbness or a tingling sensation (pins and needles) in your heel, foot, or toes. At first, your symptoms may get worse with activity and be relieved with rest. Over time, your symptoms may become constant or come on sooner with less activity. How is this diagnosed? This condition is diagnosed based on your symptoms, medical history, and physical exam. During the exam, your health care provider may tap on the area below your ankle to check for tingling in your foot or toes. Your health care provider may also inject numbing medicine into the tarsal tunnel to see if it relieves your pain. You may also have other tests, including:  X-rays to check bone structure.  MRI or ultrasound to examine nerve and tendon structures  and find where your nerve is getting compressed.  An electrical study of nerve function (electromyography, or EMG). How is this treated? Treatment may include:  Wearing a removable splint or boot for ankle support.  Using a shoe insert (orthotic) to help support your arch.  Using ice to reduce swelling.  Taking pain medicine.  Having medicine injected into your ankle joint to reduce pain and swelling.  Starting range-of-motion exercises and strengthening exercises.  Gradually returning to full activity. The timing will depend on the severity of your condition and your response to treatment. You may need surgery if there is a soft tissue growth or a bone growth, or if other treatments have not helped. After surgery, you may need to wear a removable splint or boot for support. You also may need physical therapy. Follow these instructions at home: If you have a splint or boot:  Wear the splint or boot as told by your health care provider. Remove it only as told by your health care provider.  Loosen the splint or boot if your toes tingle, become numb, or turn cold and blue.  If your splint or boot is not waterproof: ? Do not let it get wet. ? Cover it with a watertight covering when you take a bath or shower.  Keep the splint or boot clean. Managing pain, stiffness, and swelling  If directed, put ice on the injured area. ? Put ice in a plastic bag. ? Place a towel between your skin and the bag. ? Leave the ice on for 20 minutes, 2-3 times a day.  Move your toes often to avoid stiffness and to lessen swelling.  Raise (elevate) your foot above the level of your heart while you are sitting or lying down. Driving  Ask your health care provider when it is safe to drive if you have a splint or boot on a foot that you use for driving.  Do not drive or operate heavy machinery while taking prescription pain medicine. Activity  Return to your normal activities as told by your  health care provider. Ask your health care provider what activities are safe for you.  Do exercises as told by your health care provider. General instructions  Do not use any tobacco products, such as cigarettes, chewing tobacco, and e-cigarettes. Tobacco can delay healing. If you need help quitting, ask your health care provider.  Take over-the-counter and prescription medicines only as told by your health care provider.  Keep all follow-up  visits as told by your health care provider. This is important. How is this prevented?  Give your body time to rest between periods of activity.  Make sure to wear supportive and comfortable shoes during athletic activity.  Do not over-tighten ski boots or the laces on high-top shoes.  Be safe and responsible while being active to avoid falls. Contact a health care provider if:  Your ankle pain is not getting better.  You are unable to support (bear) body weight on your foot without pain. This information is not intended to replace advice given to you by your health care provider. Make sure you discuss any questions you have with your health care provider. Document Released: 03/22/2005 Document Revised: 11/26/2015 Document Reviewed: 03/04/2015 Elsevier Interactive Patient Education  2019 [place]   Tarsal Tunnel Syndrome Rehab Ask your health care provider which exercises are safe for you. Do exercises exactly as told by your health care provider and adjust them as directed. It is normal to feel mild stretching, pulling, tightness, or discomfort as you do these exercises, but you should stop right away if you feel sudden pain or your pain gets worse. Do not begin these exercises until told by your health care provider. Stretching and range of motion exercises These exercises warm up your muscles and joints and improve the movement and flexibility of your foot. These exercises also help to relieve pain, numbness, and tingling. Exercise A:  Gastrocnemius, standing  1. Stand with your hands against a wall. 2. Extend your left / right leg behind you, and bend your front knee slightly. 3. Keep your left / right heel on the floor and keep your back knee straight as you shift your weight toward the wall. Do this without arching your back. You should feel a gentle stretch in your back calf. 4. Hold this position for __________ seconds. 5. Return to the starting position. Repeat __________ times. Complete this exercise __________ times per day. Exercise B: Tibial nerve glide 1. Sit on a stable chair with both feet on the floor. 2. Clasp your hands together behind your back. Gently round your back and tuck your chin toward your chest. 3. Position your left / right foot so your toes are tipped up toward your shin and aimed out to the side. 4. Slowly straighten your knee as far as you can without increasing your symptoms. 5. Hold this position for __________ seconds. 6. Slowly bend your left / right knee to return to the starting position. Repeat __________ times. Complete this exercise __________ times per day. Strengthening exercises These exercises build strength and endurance in your foot. Endurance is the ability to use your muscles for a long time, even after they get tired. Exercise C: Plantar flexors  1. Sit on the floor with your left / right leg straight out in front of you. 2. Loop a rubber exercise band around the ball of your left / right foot. The ball of your foot is on the walking surface, right under your toes. Hold the ends of the band in your hands. 3. Slowly point your left / right toes downward, pushing them away from you. 4. Hold this position for __________ seconds. 5. Slowly return to the starting position. Repeat __________ times. Complete this exercise __________ times per day. Exercise D: Ankle inversion 1. Sit on the floor with your legs straight out in front of you. 2. Loop a rubber exercise band around  the ball of your left / right foot. The  ball of your foot is on the walking surface, right under your toes. Hold the ends of the band in your hands or secure the band to a stable object. 3. Slowly push your foot inward, toward your other leg. 4. Hold this position for __________ seconds. 5. Slowly return to the starting position. Repeat __________ times. Complete this exercise __________ times per day. Exercise E: Arch lifts (foot intrinsics) 1. Sit in a chair with your feet flat on the floor. 2. Keeping your big toe and your heel on the floor, lift only your arch, which is on the inner edge of your left / right foot. Do not move your knee or scrunch your toes. This is a small movement. 3. Hold this position for __________ seconds. 4. Slowly return to the starting position. Repeat __________ times. Complete this exercise __________ times per day. This information is not intended to replace advice given to you by your health care provider. Make sure you discuss any questions you have with your health care provider. Document Released: 03/22/2005 Document Revised: 11/26/2015 Document Reviewed: 03/04/2015 Elsevier Interactive Patient Education  2019 [employer].

## 2018-05-09 LAB — COMPLETE METABOLIC PANEL WITH GFR
AG Ratio: 1.4 (calc) (ref 1.0–2.5)
ALKALINE PHOSPHATASE (APISO): 45 U/L (ref 37–153)
ALT: 11 U/L (ref 6–29)
AST: 17 U/L (ref 10–35)
Albumin: 4.3 g/dL (ref 3.6–5.1)
BUN: 15 mg/dL (ref 7–25)
CALCIUM: 9.2 mg/dL (ref 8.6–10.4)
CO2: 25 mmol/L (ref 20–32)
CREATININE: 0.77 mg/dL (ref 0.50–0.99)
Chloride: 104 mmol/L (ref 98–110)
GFR, EST NON AFRICAN AMERICAN: 81 mL/min/{1.73_m2} (ref >=60)
GFR, Est African American: 94 mL/min/{1.73_m2} (ref >=60)
GLOBULIN: 3 g/dL (ref 1.9–3.7)
GLUCOSE: 89 mg/dL (ref 65–99)
Potassium: 4.5 mmol/L (ref 3.5–5.3)
SODIUM: 139 mmol/L (ref 135–146)
Total Bilirubin: 0.7 mg/dL (ref 0.2–1.2)
Total Protein: 7.3 g/dL (ref 6.1–8.1)

## 2018-05-09 LAB — CBC WITH DIFFERENTIAL/PLATELET
ABSOLUTE MONOCYTES: 710 {cells}/uL (ref 200–950)
BASOS PCT: 0.6 %
Basophils Absolute: 38 cells/uL (ref 0–200)
Eosinophils Absolute: 122 cells/uL (ref 15–500)
Eosinophils Relative: 1.9 %
HCT: 41.2 % (ref 35.0–45.0)
Hemoglobin: 13.6 g/dL (ref 11.7–15.5)
Lymphs Abs: 1907 cells/uL (ref 850–3900)
MCH: 29.1 pg (ref 27.0–33.0)
MCHC: 33 g/dL (ref 32.0–36.0)
MCV: 88 fL (ref 80.0–100.0)
MPV: 11.5 fL (ref 7.5–12.5)
Monocytes Relative: 11.1 %
Neutro Abs: 3622 cells/uL (ref 1500–7800)
Neutrophils Relative %: 56.6 %
PLATELETS: 303 10*3/uL (ref 140–400)
RBC: 4.68 10*6/uL (ref 3.80–5.10)
RDW: 13 % (ref 11.0–15.0)
TOTAL LYMPHOCYTE: 29.8 %
WBC: 6.4 10*3/uL (ref 3.8–10.8)

## 2018-05-09 LAB — TSH: TSH: 1.41 m[IU]/L (ref 0.40–4.50)

## 2018-05-09 LAB — HEMOGLOBIN A1C
EAG (MMOL/L): 6.2 (calc)
Hgb A1c MFr Bld: 5.5 % of total Hgb (ref <5.7)
Mean Plasma Glucose: 111 (calc)

## 2018-05-09 LAB — LIPID PANEL
CHOL/HDL RATIO: 3.3 (calc) (ref <5.0)
CHOLESTEROL: 163 mg/dL (ref <200)
HDL: 49 mg/dL — AB (ref >=50)
LDL Cholesterol (Calc): 85 mg/dL (calc)
NON-HDL CHOLESTEROL (CALC): 114 mg/dL (ref <130)
TRIGLYCERIDES: 193 mg/dL — AB (ref <150)

## 2018-05-09 LAB — MAGNESIUM: Magnesium: 2.2 mg/dL (ref 1.5–2.5)

## 2018-05-10 DIAGNOSIS — I712 Thoracic aortic aneurysm, without rupture, unspecified: Secondary | ICD-10-CM

## 2018-05-10 DIAGNOSIS — I7121 Aneurysm of the ascending aorta, without rupture: Secondary | ICD-10-CM

## 2018-05-10 NOTE — Telephone Encounter (Signed)
Placed order for CT for next year. Will send message to scheduling to help with appointment.

## 2018-05-16 ENCOUNTER — Other Ambulatory Visit: Payer: Self-pay

## 2018-05-16 DIAGNOSIS — I712 Thoracic aortic aneurysm, without rupture, unspecified: Secondary | ICD-10-CM

## 2018-05-16 DIAGNOSIS — I480 Paroxysmal atrial fibrillation: Secondary | ICD-10-CM

## 2018-05-16 NOTE — Progress Notes (Signed)
Patient having CT in 04/2019. Placed order for BMET.

## 2018-05-17 ENCOUNTER — Other Ambulatory Visit: Payer: Self-pay | Admitting: Cardiovascular Disease

## 2018-05-17 NOTE — Telephone Encounter (Signed)
Eliquis 5mg  refill request received; pt is 68 yrs old, wt-112.7kg, Crea-0.77 on 05/08/2018, last seen by Dr. [name] on 11/10/2017; will send in refill to requested pharmacy.

## 2018-05-17 NOTE — Progress Notes (Signed)
[place]

## 2018-05-31 ENCOUNTER — Other Ambulatory Visit: Payer: Self-pay | Admitting: Internal Medicine

## 2018-06-19 NOTE — Progress Notes (Signed)
  Subjective:    Patient ID: Claudia [name], female    DOB: 09-Dec-1950, 68 y.o.   MRN: [id number]  HPI   This very nice 68 yo MWF with HTN, pAfib,  HLD, Prediabetes  and Vitamin D Deficiency presents for evaluation of a pain in her Right Low Back extending to her Right buttock and Right posterior thigh.  Denies any injury.  Medication Sig  . alendronate 70 MG t TAKE 1 TABLET WEEKLY   . atenolol 50 MG tablet TAKE 1/2 TABLET DAILY  . VITAMIN D Take 8,000 Units by mouth daily.   Marland Kitchen CINNAMON Take 1,000 mg by mouth 2 (two) times daily.  . diclofenac  1 % GEL Apply 4 g topically 4 (four) times daily.  Marland Kitchen dicyclomine  10 MG  Take one capsule 4 x/day as needed for cramping/urgency  . DiphenhydrAMINE 25 mg Take by mouth as needed (allergies).   Marland Kitchen ELIQUIS 5 MG TAB TAKE 1 TABLET TWICE A DAY  . Flaxseed Take 1,000 mg by mouth 2 (two) times daily.  . hydrochlorothiazide 25 MG tablet TAKE 1 TABLET DAILY  . Hyoscyamine/SL 0.125 MG  Place 0.125 mg under tongue every 4  hrs as needed  . PREMPRO 0.625-2.5 MG tablet TAKE AS DIRECTED  . quinapril  40 MG tablet TAKE 1 TABLET DAILY  . vitamin B-12 1000 MCG tablet Take 1,000 mcg by mouth daily.   Allergies  Allergen Reactions  . Ciprofloxacin   . Levaquin [Levofloxacin In D5w]     No fluroquinolones due to ascending thoracic aortic aneurysm  . Penicillins Other (See Comments)    Had as a child=unknown reaction  . Zoloft [Sertraline Hcl]    Past Medical History:  Diagnosis Date  . Achilles tendonitis    R heal, limits activity  . Allergy   . Anxiety   . Elevated hemoglobin A1c   . Hyperlipidemia   . Hypertension   . IBS (irritable bowel syndrome)   . Insomnia   . Obesity   . Palpitations   . Vitamin D deficiency    Past Surgical History:  Procedure Laterality Date  . CESAREAN SECTION    . CESAREAN SECTION    . CHOLECYSTECTOMY  10 years ago  . TUBAL LIGATION Bilateral    Review of Systems   10 point systems review negative except as  above.    Objective:   Physical Exam  BP 132/88   Pulse 60   Temp (!) 97.2 F (36.2 C)   Resp 18   Ht 5\' 7"  (1.702 m)   Wt 240 lb 3.2 oz (109 kg)   BMI 37.62 kg/m   HEENT - WNL. Neck - supple.  Chest - Clear equal BS. Cor - Nl HS. RRR w/o sig MGR. PP 1(+). No edema. MS- FROM w/o deformities.  (+) Tender about the Rt SI area extending down to the Rt gluteal area to the posterior Rt thigh.  Gait Nl. Neuro -  Nl w/o focal abnormalities.    Assessment & Plan:   1. Thigh pain, musculoskeletal, right  - dexamethasone  1 MG tablet; Take 1 tab 3 x day - 3 days, then 2 x day - 3 days, then 1 tab daily  Disp: 20 tabs   2. Sciatica of right side  - dexamethasone 1 MG tablet; Take 1 tab 3 x day - 3 days, then 2 x day - 3 days, then 1 tab daily  Disp: 20 tabs

## 2018-06-20 ENCOUNTER — Other Ambulatory Visit: Payer: Self-pay

## 2018-06-20 ENCOUNTER — Ambulatory Visit (INDEPENDENT_AMBULATORY_CARE_PROVIDER_SITE_OTHER): Payer: Medicare Other | Admitting: Internal Medicine

## 2018-06-20 VITALS — BP 132/88 | HR 60 | Temp 97.2°F | Resp 18 | Ht 67.0 in | Wt 240.2 lb

## 2018-06-20 DIAGNOSIS — M5431 Sciatica, right side: Secondary | ICD-10-CM

## 2018-06-20 DIAGNOSIS — M79651 Pain in right thigh: Secondary | ICD-10-CM | POA: Diagnosis not present

## 2018-06-20 MED ORDER — DEXAMETHASONE 1 MG PO TABS: ORAL_TABLET | ORAL | 0 refills | Status: DC

## 2018-06-21 ENCOUNTER — Ambulatory Visit: Payer: Medicare Other | Admitting: Internal Medicine

## 2018-06-24 ENCOUNTER — Encounter: Payer: Self-pay | Admitting: Internal Medicine

## 2018-07-24 ENCOUNTER — Other Ambulatory Visit: Payer: Self-pay | Admitting: Physician Assistant

## 2018-08-06 ENCOUNTER — Other Ambulatory Visit: Payer: Self-pay | Admitting: Adult Health

## 2018-08-07 ENCOUNTER — Ambulatory Visit (INDEPENDENT_AMBULATORY_CARE_PROVIDER_SITE_OTHER): Payer: Medicare Other | Admitting: Internal Medicine

## 2018-08-07 ENCOUNTER — Encounter: Payer: Self-pay | Admitting: Internal Medicine

## 2018-08-07 ENCOUNTER — Other Ambulatory Visit: Payer: Self-pay

## 2018-08-07 VITALS — BP 132/80 | HR 60 | Temp 97.0°F | Resp 16 | Ht 67.0 in | Wt 233.2 lb

## 2018-08-07 DIAGNOSIS — E782 Mixed hyperlipidemia: Secondary | ICD-10-CM | POA: Diagnosis not present

## 2018-08-07 DIAGNOSIS — I10 Essential (primary) hypertension: Secondary | ICD-10-CM

## 2018-08-07 DIAGNOSIS — Z79899 Other long term (current) drug therapy: Secondary | ICD-10-CM | POA: Diagnosis not present

## 2018-08-07 DIAGNOSIS — I480 Paroxysmal atrial fibrillation: Secondary | ICD-10-CM | POA: Diagnosis not present

## 2018-08-07 DIAGNOSIS — R7303 Prediabetes: Secondary | ICD-10-CM | POA: Diagnosis not present

## 2018-08-07 DIAGNOSIS — R7309 Other abnormal glucose: Secondary | ICD-10-CM

## 2018-08-07 DIAGNOSIS — E559 Vitamin D deficiency, unspecified: Secondary | ICD-10-CM | POA: Diagnosis not present

## 2018-08-07 NOTE — Progress Notes (Signed)
History of Present Illness:      This very nice 68 y.o. MWF presents for 6 month follow up with HTN, pAfib,  HLD, Pre-Diabetes and Vitamin D Deficiency.       Patient is treated for HTN (1996) & BP has been controlled at home. Today's BP is at goal - 132/80.   In 2004, She had a Normal Cardiolite  (EF 70%).   In 2015, she had an episode of pAfib and is followed at [facility]. Patient has hx/o CHADs2VASc 3 and is on Eliquis. She's followed by Dr [name] & last event monitor in 2018 showed only occas PAC's.  Patient has had no complaints of any cardiac type chest pain, palpitations, dyspnea / orthopnea / PND, dizziness, claudication, or dependent edema.      Hyperlipidemia is controlled with diet & meds. Patient denies myalgias or other med SE's. Last Lipids were at goal albeit elevated Trig's: Lab Results  Component Value Date   CHOL 163 05/08/2018   HDL 49 (L) 05/08/2018   LDLCALC 85 05/08/2018   TRIG 193 (H) 05/08/2018   CHOLHDL 3.3 05/08/2018        Also, the patient has Morbid Obesity (BMI 36+) and history of PreDiabetes  (A1c 5.8% / 2012)  and has had no symptoms of reactive hypoglycemia, diabetic polys, paresthesias or visual blurring.  Last A1c was Normal & at goal: Lab Results  Component Value Date   HGBA1C 5.5 05/08/2018      Further, the patient also has history of Vitamin D Deficiency ("18" / 2008)    and supplements vitamin D without any suspected side-effects. Last vitamin D was at goal: Lab Results  Component Value Date   VD25OH 61 01/25/2018   COVID Screen     The patient does not symptoms concerning for COVID-19 infection such as CP, fever, chills, cough, loss of smell or taste or new SHORTNESS OF BREATH that suggest any further testing/ screening at this time.  Social distancing reinforced today.  Current Outpatient Medications on File Prior to Visit  Medication Sig  . alendronate (FOSAMAX) 70 MG tablet TAKE 1 TABLET WEEKLY ON AN EMPTY STOMACH WITH ONLY A FULL GLASS  OF WATER FOR 60 MINUTES  . atenolol (TENORMIN) 50 MG tablet TAKE ONE-HALF TABLET (25 MG TOTAL) DAILY. MAY TAKE EXTRA ONE-HALF TABLET (25 MG) AS NEEDED FOR PALPITATIONS  . Cholecalciferol (VITAMIN D-3 PO) Take 8,000 Units by mouth daily.   Marland Kitchen CINNAMON PO Take 1,000 mg by mouth 2 (two) times daily.  . diclofenac sodium (VOLTAREN) 1 % GEL Apply 4 g topically 4 (four) times daily.  . DiphenhydrAMINE HCl (BENADRYL ALLERGY PO) Take by mouth as needed (allergies).   Marland Kitchen ELIQUIS 5 MG TABS tablet TAKE 1 TABLET TWICE A DAY  . Flaxseed, Linseed, (FLAX SEEDS PO) Take 1,000 mg by mouth 2 (two) times daily.  . hydrochlorothiazide (HYDRODIURIL) 25 MG tablet TAKE 1 TABLET DAILY  . Hyoscyamine Sulfate SL (LEVSIN/SL) 0.125 MG SUBL Place 0.125 mg under the tongue every 4 (four) hours as needed (diarrhea).  Marland Kitchen PREMPRO 0.625-2.5 MG tablet TAKE AS DIRECTED  . quinapril (ACCUPRIL) 40 MG tablet TAKE 1 TABLET DAILY  . vitamin B-12 (CYANOCOBALAMIN) 1000 MCG tablet Take 1,000 mcg by mouth daily.   No current facility-administered medications on file prior to visit.    Allergies  Allergen Reactions  . Ciprofloxacin   . Levaquin [Levofloxacin In D5w]     No fluroquinolones due to ascending thoracic aortic aneurysm  .  Penicillins Other (See Comments)    Had as a child=unknown reaction  . Zoloft [Sertraline Hcl]    PMHx:   Past Medical History:  Diagnosis Date  . Achilles tendonitis    R heal, limits activity  . Allergy   . Anxiety   . Elevated hemoglobin A1c   . Hyperlipidemia   . Hypertension   . IBS (irritable bowel syndrome)   . Insomnia   . Obesity   . Palpitations   . Vitamin D deficiency    Immunization History  Administered Date(s) Administered  . Influenza Split 02/19/2013  . Influenza, High Dose Seasonal PF 01/25/2018  . Influenza, Seasonal, Injecte, Preservative Fre 03/12/2015  . Influenza,inj,Quad PF,6+ Mos 01/27/2017  . Influenza-Unspecified 04/06/2011  . PPD Test 10/15/2013  .  Pneumococcal Conjugate-13 01/25/2018  . Pneumococcal-Unspecified 04/06/1995  . Td 10/25/2016  . Tdap 04/05/2006  . Zoster 07/04/2013   Past Surgical History:  Procedure Laterality Date  . CESAREAN SECTION    . CESAREAN SECTION    . CHOLECYSTECTOMY  10 years ago  . TUBAL LIGATION Bilateral    FHx:    Reviewed / unchanged  SHx:    Reviewed / unchanged   Systems Review:  Constitutional: Denies fever, chills, wt changes, headaches, insomnia, fatigue, night sweats, change in appetite. Eyes: Denies redness, blurred vision, diplopia, discharge, itchy, watery eyes.  ENT: Denies discharge, congestion, post nasal drip, epistaxis, sore throat, earache, hearing loss, dental pain, tinnitus, vertigo, sinus pain, snoring.  CV: Denies chest pain, palpitations, irregular heartbeat, syncope, dyspnea, diaphoresis, orthopnea, PND, claudication or edema. Respiratory: denies cough, dyspnea, DOE, pleurisy, hoarseness, laryngitis, wheezing.  Gastrointestinal: Denies dysphagia, odynophagia, heartburn, reflux, water brash, abdominal pain or cramps, nausea, vomiting, bloating, diarrhea, constipation, hematemesis, melena, hematochezia  or hemorrhoids. Genitourinary: Denies dysuria, frequency, urgency, nocturia, hesitancy, discharge, hematuria or flank pain. Musculoskeletal: Denies arthralgias, myalgias, stiffness, jt. swelling, pain, limping or strain/sprain.  Skin: Denies pruritus, rash, hives, warts, acne, eczema or change in skin lesion(s). Neuro: No weakness, tremor, incoordination, spasms, paresthesia or pain. Psychiatric: Denies confusion, memory loss or sensory loss. Endo: Denies change in weight, skin or hair change.  Heme/Lymph: No excessive bleeding, bruising or enlarged lymph nodes.  Physical Exam  BP 132/80   Pulse 60   Temp (!) 97 F (36.1 C)   Resp 16   Ht 5\' 7"  (1.702 m)   Wt 233 lb 3.2 oz (105.8 kg)   BMI 36.52 kg/m   Appears  Over  nourished, well groomed  and in no distress.   Eyes: PERRLA, EOMs, conjunctiva no swelling or erythema. Sinuses: No frontal/maxillary tenderness ENT/Mouth: EAC's clear, TM's nl w/o erythema, bulging. Nares clear w/o erythema, swelling, exudates. Oropharynx clear without erythema or exudates. Oral hygiene is good. Tongue normal, non obstructing. Hearing intact.  Neck: Supple. Thyroid not palpable. Car 2+/2+ without bruits, nodes or JVD. Chest: Respirations nl with BS clear & equal w/o rales, rhonchi, wheezing or stridor.  Cor: Heart sounds normal w/ regular rate and rhythm without sig. murmurs, gallops, clicks or rubs. Peripheral pulses normal and equal  without edema.  Abdomen: Soft & bowel sounds normal. Non-tender w/o guarding, rebound, hernias, masses or organomegaly.  Lymphatics: Unremarkable.  Musculoskeletal: Full ROM all peripheral extremities, joint stability, 5/5 strength and normal gait.  Skin: Warm, dry without exposed rashes, lesions or ecchymosis apparent.  Neuro: Cranial nerves intact, reflexes equal bilaterally. Sensory-motor testing grossly intact. Tendon reflexes grossly intact.  Pysch: Alert & oriented x 3.  Insight and judgement nl &  appropriate. No ideations.  Assessment and Plan:  1. Essential hypertension  - Continue medication, monitor blood pressure at home.  - Continue DASH diet.  Reminder to go to the ER if any CP,  SOB, nausea, dizziness, severe HA, changes vision/speech.  - CBC with Differential/Platelet - COMPLETE METABOLIC PANEL WITH GFR - Magnesium - TSH  2. Hyperlipidemia, mixed  - Continue diet/meds, exercise,& lifestyle modifications.  - Continue monitor periodic cholesterol/liver & renal functions   - Lipid panel - TSH  3. Abnormal glucose  - Continue diet, exercise  - Lifestyle modifications.  - Monitor appropriate labs.  - Hemoglobin A1c - Insulin, random  4. Vitamin D deficiency  - Continue supplementation.  - VITAMIN D 25 Hydroxyl  5. Prediabetes  - Continue diet, exercise   - Lifestyle modifications.  - Monitor appropriate labs.  - Hemoglobin A1c - Insulin, random  6. Paroxysmal atrial fibrillation (HCC)  - TSH  7. Medication management  - CBC with Differential/Platelet - COMPLETE METABOLIC PANEL WITH GFR - Magnesium - Lipid panel - TSH - Hemoglobin A1c - Insulin, random - VITAMIN D 25 Hydroxyl      Discussed  regular exercise, BP monitoring, weight control to achieve/maintain BMI less than 25 and discussed med and SE's. Recommended labs to assess and monitor clinical status with further disposition pending results of labs. Over 30 minutes of exam, counseling, chart review was performed. I provided  over 40 minutes of exam, counseling, chart review and  complex critical decision making was performed  Kirtland Bouchard, MD

## 2018-08-07 NOTE — Patient Instructions (Signed)

## 2018-08-08 LAB — COMPLETE METABOLIC PANEL WITH GFR
AG Ratio: 1.4 (calc) (ref 1.0–2.5)
ALT: 15 U/L (ref 6–29)
AST: 17 U/L (ref 10–35)
Albumin: 4 g/dL (ref 3.6–5.1)
Alkaline phosphatase (APISO): 44 U/L (ref 37–153)
BUN: 16 mg/dL (ref 7–25)
CO2: 27 mmol/L (ref 20–32)
Calcium: 9.3 mg/dL (ref 8.6–10.4)
Chloride: 103 mmol/L (ref 98–110)
Creat: 0.85 mg/dL (ref 0.50–0.99)
GFR, Est African American: 83 mL/min/{1.73_m2} (ref >=60)
GFR, Est Non African American: 72 mL/min/{1.73_m2} (ref >=60)
Globulin: 2.8 g/dL (calc) (ref 1.9–3.7)
Glucose, Bld: 91 mg/dL (ref 65–99)
Potassium: 4.2 mmol/L (ref 3.5–5.3)
Sodium: 136 mmol/L (ref 135–146)
Total Bilirubin: 0.6 mg/dL (ref 0.2–1.2)
Total Protein: 6.8 g/dL (ref 6.1–8.1)

## 2018-08-08 LAB — CBC WITH DIFFERENTIAL/PLATELET
Absolute Monocytes: 685 {cells}/uL (ref 200–950)
Basophils Absolute: 38 {cells}/uL (ref 0–200)
Basophils Relative: 0.6 %
Eosinophils Absolute: 154 {cells}/uL (ref 15–500)
Eosinophils Relative: 2.4 %
HCT: 38.1 % (ref 35.0–45.0)
Hemoglobin: 13 g/dL (ref 11.7–15.5)
Lymphs Abs: 1715 {cells}/uL (ref 850–3900)
MCH: 30.4 pg (ref 27.0–33.0)
MCHC: 34.1 g/dL (ref 32.0–36.0)
MCV: 89.2 fL (ref 80.0–100.0)
MPV: 11.7 fL (ref 7.5–12.5)
Monocytes Relative: 10.7 %
Neutro Abs: 3808 {cells}/uL (ref 1500–7800)
Neutrophils Relative %: 59.5 %
Platelets: 305 10*3/uL (ref 140–400)
RBC: 4.27 Million/uL (ref 3.80–5.10)
RDW: 12.4 % (ref 11.0–15.0)
Total Lymphocyte: 26.8 %
WBC: 6.4 10*3/uL (ref 3.8–10.8)

## 2018-08-08 LAB — MAGNESIUM: Magnesium: 1.9 mg/dL (ref 1.5–2.5)

## 2018-08-08 LAB — INSULIN, RANDOM: Insulin: 9.9 u[IU]/mL

## 2018-08-08 LAB — LIPID PANEL
Cholesterol: 156 mg/dL
HDL: 42 mg/dL — ABNORMAL LOW
LDL Cholesterol (Calc): 85 mg/dL
Non-HDL Cholesterol (Calc): 114 mg/dL
Total CHOL/HDL Ratio: 3.7 (calc)
Triglycerides: 197 mg/dL — ABNORMAL HIGH

## 2018-08-08 LAB — HEMOGLOBIN A1C
Hgb A1c MFr Bld: 5.5 % of total Hgb (ref <5.7)
Mean Plasma Glucose: 111 (calc)
eAG (mmol/L): 6.2 (calc)

## 2018-08-08 LAB — TSH: TSH: 1.16 mIU/L (ref 0.40–4.50)

## 2018-08-08 LAB — VITAMIN D 25 HYDROXY (VIT D DEFICIENCY, FRACTURES): Vit D, 25-Hydroxy: 82 ng/mL (ref 30–100)

## 2018-08-23 DIAGNOSIS — M1712 Unilateral primary osteoarthritis, left knee: Secondary | ICD-10-CM | POA: Diagnosis not present

## 2018-08-23 DIAGNOSIS — M1711 Unilateral primary osteoarthritis, right knee: Secondary | ICD-10-CM | POA: Diagnosis not present

## 2018-08-30 DIAGNOSIS — M1711 Unilateral primary osteoarthritis, right knee: Secondary | ICD-10-CM | POA: Diagnosis not present

## 2018-08-30 DIAGNOSIS — M1712 Unilateral primary osteoarthritis, left knee: Secondary | ICD-10-CM | POA: Diagnosis not present

## 2018-09-06 DIAGNOSIS — M1712 Unilateral primary osteoarthritis, left knee: Secondary | ICD-10-CM | POA: Diagnosis not present

## 2018-09-06 DIAGNOSIS — M1711 Unilateral primary osteoarthritis, right knee: Secondary | ICD-10-CM | POA: Diagnosis not present

## 2018-10-30 ENCOUNTER — Other Ambulatory Visit: Payer: Self-pay | Admitting: Cardiovascular Disease

## 2018-11-06 NOTE — Progress Notes (Signed)
Assessment and Plan:   Essential hypertension - continue medications, DASH diet, exercise and monitor at home. Call if greater than 130/80.   SVT (supraventricular tachycardia) (HCC) Continue meds, continue cardio follow up, no symptoms at this time.   Paroxysmal atrial fibrillation (HCC) Continue meds, continue cardio follow up, no symptoms at this time.   Thoracic aortic aneurysm without rupture (HCC) Control blood pressure, cholesterol, glucose, increase exercise.   Severe obesity (BMI >= 40) (HCC) - follow up 3 months for progress monitoring - increase veggies, decrease carbs - long discussion about weight loss, diet, and exercise  Mixed hyperlipidemia -continue medications, check lipids, decrease fatty foods, increase activity.   Continue diet and meds as discussed. Further disposition pending results of labs. Future Appointments  Date Time Provider [employer]  11/13/2018 10:45 AM Vicie Mutters, PA-C [facility] None  01/17/2019 10:00 AM Josue Hector, MD [facility]  02/27/2019  3:00 PM Unk Pinto, MD [facility] None  04/17/2019  7:30 AM [facility]  04/24/2019 10:00 AM [facility]    HPI 68 y.o. female  presents for 3 month follow up with hypertension, hyperlipidemia, prediabetes and vitamin D.  She will have diarrhea frequently, takes imodium every other day, colonoscopy 2014 Dr. Olevia Perches normal. No pain with diarrhea, just urgency. Sp gallbladder removal.    Her blood pressure has been controlled at home, today their BP is BP: 132/80.   She does not workout. She denies chest pain, shortness of breath, dizziness. BMI is Body mass index is 34.02 kg/m., she is working on diet and exercise.She is doing weight watchers, walking some but is overall more active.  Wt Readings from Last 3 Encounters:  11/13/18 217 lb 3.2 oz (98.5 kg)  08/07/18 233 lb 3.2 oz (105.8 kg)  06/20/18 240 lb 3.2 oz (109 kg)    She has been following with [facility], has Afib, has Thoracic aortic anuerysm, on elliquis.    She is NOT on cholesterol medication and denies myalgias. Her cholesterol is at goal. The cholesterol last visit was:   Lab Results  Component Value Date   CHOL 156 08/07/2018   HDL 42 (L) 08/07/2018   LDLCALC 85 08/07/2018   TRIG 197 (H) 08/07/2018   CHOLHDL 3.7 08/07/2018    She has been working on diet and exercise for prediabetes, and denies foot ulcerations, hyperglycemia, hypoglycemia , increased appetite, nausea, paresthesia of the feet, polydipsia, polyuria, visual disturbances, vomiting and weight loss. Last A1C in the office was:  Lab Results  Component Value Date   HGBA1C 5.5 08/07/2018   Patient is on Vitamin D supplement.  Lab Results  Component Value Date   VD25OH 82 08/07/2018      Current Medications:  Current Outpatient Medications on File Prior to Visit  Medication Sig Dispense Refill  . alendronate (FOSAMAX) 70 MG tablet TAKE 1 TABLET WEEKLY ON AN EMPTY STOMACH WITH ONLY A FULL GLASS OF WATER FOR 60 MINUTES 12 tablet 4  . atenolol (TENORMIN) 50 MG tablet TAKE ONE-HALF (1/2) TABLET (25 MG) DAILY, MAY TAKE EXTRA ONE-HALF (1/2) TABLET AS NEEDED FOR PALPITATIONS 90 tablet 3  . Cholecalciferol (VITAMIN D-3 PO) Take 8,000 Units by mouth daily.     Marland Kitchen CINNAMON PO Take 1,000 mg by mouth 2 (two) times daily.    . diclofenac sodium (VOLTAREN) 1 % GEL Apply 4 g topically 4 (four) times daily. 100 g 3  . DiphenhydrAMINE HCl (BENADRYL ALLERGY PO) Take by mouth as  needed (allergies).     Marland Kitchen ELIQUIS 5 MG TABS tablet TAKE 1 TABLET TWICE A DAY 180 tablet 2  . Flaxseed, Linseed, (FLAX SEEDS PO) Take 1,000 mg by mouth 2 (two) times daily.    . hydrochlorothiazide (HYDRODIURIL) 25 MG tablet TAKE 1 TABLET DAILY 90 tablet 4  . Hyoscyamine Sulfate SL (LEVSIN/SL) 0.125 MG SUBL Place 0.125 mg under the tongue every 4 (four) hours as needed (diarrhea). 90 each 0  . PREMPRO 0.625-2.5 MG  tablet TAKE AS DIRECTED 84 tablet 3  . quinapril (ACCUPRIL) 40 MG tablet TAKE 1 TABLET DAILY 90 tablet 4  . vitamin B-12 (CYANOCOBALAMIN) 1000 MCG tablet Take 1,000 mcg by mouth daily.     No current facility-administered medications on file prior to visit.     Medical History:  Past Medical History:  Diagnosis Date  . Achilles tendonitis    R heal, limits activity  . Allergy   . Anxiety   . Elevated hemoglobin A1c   . Hyperlipidemia   . Hypertension   . IBS (irritable bowel syndrome)   . Insomnia   . Obesity   . Palpitations   . Vitamin D deficiency     Allergies:  Allergies  Allergen Reactions  . Ciprofloxacin   . Levaquin [Levofloxacin In D5w]     No fluroquinolones due to ascending thoracic aortic aneurysm  . Penicillins Other (See Comments)    Had as a child=unknown reaction  . Zoloft [Sertraline Hcl]      Review of Systems:  Review of Systems  Constitutional: Negative for chills, fever and malaise/fatigue.  HENT: Negative for congestion, ear pain and sore throat.   Eyes: Negative.   Respiratory: Negative for cough, shortness of breath and wheezing.   Cardiovascular: Negative for chest pain, palpitations and leg swelling.  Gastrointestinal: Negative for abdominal pain, blood in stool, constipation, diarrhea, heartburn and melena.  Genitourinary: Negative.   Musculoskeletal: Negative for joint pain.  Skin: Negative.   Neurological: Negative for dizziness, sensory change, loss of consciousness and headaches.  Psychiatric/Behavioral: Negative for depression. The patient is not nervous/anxious and does not have insomnia.     Family history- Review and unchanged  Social history- Review and unchanged  Physical Exam: BP 132/80   Pulse 60   Temp (!) 97.5 F (36.4 C)   Ht 5\' 7"  (1.702 m)   Wt 217 lb 3.2 oz (98.5 kg)   SpO2 98%   BMI 34.02 kg/m  Wt Readings from Last 3 Encounters:  11/13/18 217 lb 3.2 oz (98.5 kg)  08/07/18 233 lb 3.2 oz (105.8 kg)   06/20/18 240 lb 3.2 oz (109 kg)    General Appearance: Well nourished well developed, in no apparent distress. Eyes: PERRLA, EOMs, conjunctiva no swelling or erythema ENT/Mouth: Ear canals normal without obstruction, swelling, erythma, discharge.  TMs normal bilaterally.  Oropharynx moist, clear, without exudate, or postoropharyngeal swelling. Neck: Supple, thyroid normal,no cervical adenopathy  Respiratory: Respiratory effort normal, Breath sounds clear A&P without rhonchi, wheeze, or rale.  No retractions, no accessory usage. Cardio: RRR with no MRGs. Brisk peripheral pulses without edema.  Abdomen: Soft, + BS, obese,  Non tender, no guarding, rebound, hernias, masses. Musculoskeletal: Full ROM, 5/5 strength.  Skin: Warm, dry without rashes, lesions, ecchymosis.  Neuro: Awake and oriented X 3, Cranial nerves intact. Normal muscle tone, no cerebellar symptoms. Psych: Normal affect, Insight and Judgment appropriate.    Vicie Mutters, PA-C 10:38 AM [facility]

## 2018-11-13 ENCOUNTER — Encounter: Payer: Self-pay | Admitting: Physician Assistant

## 2018-11-13 ENCOUNTER — Other Ambulatory Visit: Payer: Self-pay

## 2018-11-13 ENCOUNTER — Ambulatory Visit (INDEPENDENT_AMBULATORY_CARE_PROVIDER_SITE_OTHER): Payer: Medicare Other | Admitting: Physician Assistant

## 2018-11-13 VITALS — BP 132/80 | HR 60 | Temp 97.5°F | Ht 67.0 in | Wt 217.2 lb

## 2018-11-13 DIAGNOSIS — I480 Paroxysmal atrial fibrillation: Secondary | ICD-10-CM

## 2018-11-13 DIAGNOSIS — E559 Vitamin D deficiency, unspecified: Secondary | ICD-10-CM | POA: Diagnosis not present

## 2018-11-13 DIAGNOSIS — Z79899 Other long term (current) drug therapy: Secondary | ICD-10-CM

## 2018-11-13 DIAGNOSIS — R7309 Other abnormal glucose: Secondary | ICD-10-CM | POA: Diagnosis not present

## 2018-11-13 DIAGNOSIS — I712 Thoracic aortic aneurysm, without rupture, unspecified: Secondary | ICD-10-CM

## 2018-11-13 DIAGNOSIS — I10 Essential (primary) hypertension: Secondary | ICD-10-CM | POA: Diagnosis not present

## 2018-11-13 DIAGNOSIS — E782 Mixed hyperlipidemia: Secondary | ICD-10-CM | POA: Diagnosis not present

## 2018-11-13 NOTE — Patient Instructions (Addendum)
Can do a steroid nasal spary 1-2 sparys at night each nostril.  Remember to spray each nostril twice towards the outer part of your eye.   Do not sniff but instead pinch your nose and tilt your head back to help the medicine get into your sinuses.   The best time to do this is at bedtime.  Stop if you get blurred vision or nose bleeds.   THIS WILL TAKE 7 DAYS TO WORK AND IS BETTER IF YOU START BEFORE SYMPTOMS SO IF YOU HAVE A SEASON OR TIME OF THE YEAR YOU ALWAYS GET A COLD, START BEFORE THAT!    Google mindful eating and here are some tips and tricks below.   Rate your hunger before you eat on a scale of 1-10, try to eat closer to a 6 or higher. And if you are at below that, why are you eating? Slow down and listen to your body.

## 2018-11-14 LAB — COMPLETE METABOLIC PANEL WITH GFR
AG Ratio: 1.3 (calc) (ref 1.0–2.5)
ALT: 15 U/L (ref 6–29)
AST: 19 U/L (ref 10–35)
Albumin: 4 g/dL (ref 3.6–5.1)
Alkaline phosphatase (APISO): 47 U/L (ref 37–153)
BUN: 13 mg/dL (ref 7–25)
CO2: 27 mmol/L (ref 20–32)
Calcium: 9.2 mg/dL (ref 8.6–10.4)
Chloride: 102 mmol/L (ref 98–110)
Creat: 0.79 mg/dL (ref 0.50–0.99)
GFR, Est African American: 90 mL/min/{1.73_m2} (ref >=60)
GFR, Est Non African American: 78 mL/min/{1.73_m2} (ref >=60)
Globulin: 3 g/dL (calc) (ref 1.9–3.7)
Glucose, Bld: 92 mg/dL (ref 65–99)
Potassium: 4.9 mmol/L (ref 3.5–5.3)
Sodium: 137 mmol/L (ref 135–146)
Total Bilirubin: 0.8 mg/dL (ref 0.2–1.2)
Total Protein: 7 g/dL (ref 6.1–8.1)

## 2018-11-14 LAB — CBC WITH DIFFERENTIAL/PLATELET
Absolute Monocytes: 601 cells/uL (ref 200–950)
Basophils Absolute: 31 cells/uL (ref 0–200)
Basophils Relative: 0.5 %
Eosinophils Absolute: 118 cells/uL (ref 15–500)
Eosinophils Relative: 1.9 %
HCT: 40.2 % (ref 35.0–45.0)
Hemoglobin: 13.4 g/dL (ref 11.7–15.5)
Lymphs Abs: 1407 cells/uL (ref 850–3900)
MCH: 30.5 pg (ref 27.0–33.0)
MCHC: 33.3 g/dL (ref 32.0–36.0)
MCV: 91.4 fL (ref 80.0–100.0)
MPV: 11.4 fL (ref 7.5–12.5)
Monocytes Relative: 9.7 %
Neutro Abs: 4042 cells/uL (ref 1500–7800)
Neutrophils Relative %: 65.2 %
Platelets: 304 10*3/uL (ref 140–400)
RBC: 4.4 10*6/uL (ref 3.80–5.10)
RDW: 12.2 % (ref 11.0–15.0)
Total Lymphocyte: 22.7 %
WBC: 6.2 10*3/uL (ref 3.8–10.8)

## 2018-11-14 LAB — HEMOGLOBIN A1C
Hgb A1c MFr Bld: 5.4 % of total Hgb (ref <5.7)
Mean Plasma Glucose: 108 (calc)
eAG (mmol/L): 6 (calc)

## 2018-11-14 LAB — LIPID PANEL
Cholesterol: 168 mg/dL (ref <200)
HDL: 50 mg/dL (ref >=50)
LDL Cholesterol (Calc): 88 mg/dL (calc)
Non-HDL Cholesterol (Calc): 118 mg/dL (calc) (ref <130)
Total CHOL/HDL Ratio: 3.4 (calc) (ref <5.0)
Triglycerides: 200 mg/dL — ABNORMAL HIGH (ref <150)

## 2018-11-14 LAB — VITAMIN D 25 HYDROXY (VIT D DEFICIENCY, FRACTURES): Vit D, 25-Hydroxy: 79 ng/mL (ref 30–100)

## 2018-11-14 LAB — MAGNESIUM: Magnesium: 2.1 mg/dL (ref 1.5–2.5)

## 2018-11-14 LAB — TSH: TSH: 1.32 mIU/L (ref 0.40–4.50)

## 2018-11-15 ENCOUNTER — Other Ambulatory Visit: Payer: Self-pay | Admitting: Physician Assistant

## 2018-11-15 DIAGNOSIS — Z1231 Encounter for screening mammogram for malignant neoplasm of breast: Secondary | ICD-10-CM

## 2018-12-15 DIAGNOSIS — Z23 Encounter for immunization: Secondary | ICD-10-CM | POA: Diagnosis not present

## 2019-01-15 NOTE — Progress Notes (Signed)
CARDIOLOGY OFFICE NOTE  Date:  01/17/2019    Claudia [name] Date of Birth: 1951/03/23 Medical Record [id number]  PCP:  Unk Pinto, MD  Cardiologist:  [name]  No chief complaint on file.   History of Present Illness:  68 y.o. with HTN, PAF, DM likely OSA but no sleep study done. She has CHA2VASC score 3 and is maintained on eliquis. Tends toward bradycardia and beta blocker dose has been decreased   Event monitor benign PAC;s 10/26/16 Myovue normal EF 68% 10/19/16   Stable mild aortic root dilatation by CT 04/11/17 4.2 cm   Primary did lots blood work including TSH which was normal   IBS bothering her more   Lost close to 40 lbs with weight watchers has bad knees but activity better Told her not to take Tumeric with her eliquis   Past Medical History:  Diagnosis Date  . Achilles tendonitis    R heal, limits activity  . Allergy   . Anxiety   . Elevated hemoglobin A1c   . Hyperlipidemia   . Hypertension   . IBS (irritable bowel syndrome)   . Insomnia   . Obesity   . Palpitations   . Vitamin D deficiency     Past Surgical History:  Procedure Laterality Date  . CESAREAN SECTION    . CESAREAN SECTION    . CHOLECYSTECTOMY  10 years ago  . TUBAL LIGATION Bilateral      Medications: Current Meds  Medication Sig  . alendronate (FOSAMAX) 70 MG tablet TAKE 1 TABLET WEEKLY ON AN EMPTY STOMACH WITH ONLY A FULL GLASS OF WATER FOR 60 MINUTES  . atenolol (TENORMIN) 50 MG tablet TAKE ONE-HALF (1/2) TABLET (25 MG) DAILY, MAY TAKE EXTRA ONE-HALF (1/2) TABLET AS NEEDED FOR PALPITATIONS  . Cholecalciferol (VITAMIN D-3 PO) Take 8,000 Units by mouth daily.   Marland Kitchen CINNAMON PO Take 1,000 mg by mouth 2 (two) times daily.  . diclofenac sodium (VOLTAREN) 1 % GEL Apply 4 g topically 4 (four) times daily.  . DiphenhydrAMINE HCl (BENADRYL ALLERGY PO) Take by mouth as needed (allergies).   Marland Kitchen ELIQUIS 5 MG TABS tablet TAKE 1 TABLET TWICE A DAY  . Flaxseed, Linseed, (FLAX SEEDS  PO) Take 1,000 mg by mouth 2 (two) times daily.  . hydrochlorothiazide (HYDRODIURIL) 25 MG tablet TAKE 1 TABLET DAILY  . loperamide (IMODIUM) 2 MG capsule Take 4 mg by mouth every other day.  Marland Kitchen PREMPRO 0.625-2.5 MG tablet TAKE AS DIRECTED  . quinapril (ACCUPRIL) 40 MG tablet TAKE 1 TABLET DAILY  . vitamin B-12 (CYANOCOBALAMIN) 1000 MCG tablet Take 1,000 mcg by mouth daily.     Allergies: Allergies  Allergen Reactions  . Ciprofloxacin   . Levaquin [Levofloxacin In D5w]     No fluroquinolones due to ascending thoracic aortic aneurysm  . Penicillins Other (See Comments)    Had as a child=unknown reaction  . Zoloft [Sertraline Hcl]     Social History: The patient  reports that she quit smoking about 14 years ago. She has never used smokeless tobacco. She reports current alcohol use. She reports that she does not use drugs.   Family History: The patient's family history includes Alcohol abuse in her father; Diabetes in her mother; Heart attack in her father and mother; Heart disease in her mother; Macular degeneration in her mother; Ovarian cancer in her maternal grandmother; Stroke in her mother.   Review of Systems: Please see the history of present illness.   Otherwise, the review  of systems is positive for none.   All other systems are reviewed and negative.   Physical Exam: VS:  BP 120/66   Pulse (!) 52   Ht 5\' 7"  (1.702 m)   Wt 210 lb (95.3 kg)   SpO2 97%   BMI 32.89 kg/m  .  BMI Body mass index is 32.89 kg/m.  Wt Readings from Last 3 Encounters:  01/17/19 210 lb (95.3 kg)  11/13/18 217 lb 3.2 oz (98.5 kg)  08/07/18 233 lb 3.2 oz (105.8 kg)   Affect appropriate Healthy:  appears stated age 32: normal Neck supple with no adenopathy JVP normal no bruits no thyromegaly Lungs clear with no wheezing and good diaphragmatic motion Heart:  S1/S2 no murmur, no rub, gallop or click PMI normal Abdomen: benighn, BS positve, no tenderness, no AAA no bruit.  No HSM or HJR  Distal pulses intact with no bruits No edema Neuro non-focal Skin warm and dry No muscular weakness    LABORATORY DATA:  EKG:  10/12/16  This demonstrates NSR.   Lab Results  Component Value Date   WBC 6.2 11/13/2018   HGB 13.4 11/13/2018   HCT 40.2 11/13/2018   PLT 304 11/13/2018   GLUCOSE 92 11/13/2018   CHOL 168 11/13/2018   TRIG 200 (H) 11/13/2018   HDL 50 11/13/2018   LDLCALC 88 11/13/2018   ALT 15 11/13/2018   AST 19 11/13/2018   NA 137 11/13/2018   K 4.9 11/13/2018   CL 102 11/13/2018   CREATININE 0.79 11/13/2018   BUN 13 11/13/2018   CO2 27 11/13/2018   TSH 1.32 11/13/2018   HGBA1C 5.4 11/13/2018   MICROALBUR <0.2 01/25/2018     BNP (last 3 results) No results for input(s): BNP in the last 8760 hours.  ProBNP (last 3 results) No results for input(s): PROBNP in the last 8760 hours.   Other Studies Reviewed Today:  Echo Study Conclusions from 2015  - Left ventricle: The cavity size was normal. Wall thickness was increased in a pattern of mild LVH. Systolic function was normal. The estimated ejection fraction was in the range of 60% to 65%. Wall motion was normal; there were no regional wall motion abnormalities. Features are consistent with a pseudonormal left ventricular filling pattern, with concomitant abnormal relaxation and increased filling pressure (grade 2 diastolic dysfunction). - Aortic valve: There was no stenosis. - Aorta: Ascending aortic diameter: 25mm (S). - Ascending aorta: The ascending aorta was mildly dilated. - Mitral valve: No significant regurgitation. - Left atrium: The atrium was mildly dilated. - Right ventricle: The cavity size was normal. Systolic function was normal. - Right atrium: The atrium was mildly dilated. - Tricuspid valve: Peak RV-RA gradient: 21mm Hg (S). - Pulmonary arteries: PA peak pressure: 31mm Hg (S). - Inferior vena cava: The vessel was normal in size; the respirophasic diameter  changes were in the normal range (= 50%); findings are consistent with normal central venous pressure. Impressions:  - Normal LV size and systolic function, EF [id number]. Mild LV hypertrophy. Moderate diastolic dysfunction. Normal RV size and systolic function. No significant valvular abnormalities. Mildly dilated ascending aorta.   CTA CHEST MPRESSION  04/11/17  IMPRESSION: Stable 4.2 x 4 cm ascending thoracic aortic aneurysm. No associated complication. Recommend annual imaging followup by CTA or MRA. This recommendation follows 2010 ACCF/AHA/AATS/ACR/ASA/SCA/SCAI/SIR/STS/SVM Guidelines for the Diagnosis and Management of Patients with Thoracic Aortic Disease. Circulation. 2010; [id number].  Stable appearance of slightly prominent main pulmonary artery measuring up to 3.4  cm.  Scattered areas of scarring/subsegmental atelectasis.   Assessment/Plan:  1. IBS:  Continue Bentyl f/u GI   2. Palpitations most recent event monitor 10/26/16 only benign PAC;s   3. Obesity - encouraged weight loss in the past - she does not exercise. Explained this is a risk factor for her AF.   4. History of bradycardia - stable on 1/2 dose atenolol. She does use extra prn.   5. Snoring - She has declinedsleep study in the past. Declined again today despite this being a risk factor for AF.   6. Dilated aorta - 04/11/17 CTA  4.2 x 4.0 cm - no change on CTA done 04/11/17 repeat ordered for 04/2019   7. PAF: CHADVASC 3 no recurrence since 2015 on eliquis      [employer]

## 2019-01-17 ENCOUNTER — Ambulatory Visit (INDEPENDENT_AMBULATORY_CARE_PROVIDER_SITE_OTHER): Payer: Medicare Other | Admitting: Cardiovascular Disease

## 2019-01-17 ENCOUNTER — Other Ambulatory Visit: Payer: Self-pay

## 2019-01-17 ENCOUNTER — Encounter: Payer: Self-pay | Admitting: Cardiovascular Disease

## 2019-01-17 VITALS — BP 120/66 | HR 52 | Ht 67.0 in | Wt 210.0 lb

## 2019-01-17 DIAGNOSIS — R002 Palpitations: Secondary | ICD-10-CM | POA: Diagnosis not present

## 2019-01-17 NOTE — Patient Instructions (Addendum)

## 2019-01-31 ENCOUNTER — Ambulatory Visit
Admission: RE | Admit: 2019-01-31 | Discharge: 2019-01-31 | Disposition: A | Discharge status: Home / Self Care | Payer: Medicare Other | Source: Ambulatory Visit | Attending: Physician Assistant | Admitting: Physician Assistant

## 2019-01-31 ENCOUNTER — Other Ambulatory Visit: Payer: Self-pay

## 2019-01-31 DIAGNOSIS — Z780 Asymptomatic menopausal state: Secondary | ICD-10-CM | POA: Diagnosis not present

## 2019-01-31 DIAGNOSIS — M816 Localized osteoporosis [Lequesne]: Secondary | ICD-10-CM

## 2019-01-31 DIAGNOSIS — Z1231 Encounter for screening mammogram for malignant neoplasm of breast: Secondary | ICD-10-CM

## 2019-01-31 DIAGNOSIS — M8589 Other specified disorders of bone density and structure, multiple sites: Secondary | ICD-10-CM | POA: Diagnosis not present

## 2019-02-11 ENCOUNTER — Other Ambulatory Visit: Payer: Self-pay | Admitting: Cardiovascular Disease

## 2019-02-12 ENCOUNTER — Telehealth: Payer: Self-pay

## 2019-02-12 DIAGNOSIS — I712 Thoracic aortic aneurysm, without rupture, unspecified: Secondary | ICD-10-CM

## 2019-02-12 NOTE — Telephone Encounter (Signed)
Pt last saw Dr [name] 01/17/19, last labs 11/13/18 Creat 0.79, age 68, weight 95.3kg, based on specified criteria pt is on appropriate dosage of Eliquis 5mg  BID.  Will refill rx.

## 2019-02-12 NOTE — Telephone Encounter (Addendum)
Per Marzetta Board in CT-            "Please put in new order and reschedule patient at another facility. We are unable to do CT because of insurance.  Patient was scheduled to have CT Angio Aorta W CM on 1/19/202. Ordering Provider Dr [name]. Patient will need new order and rescheduled at different facility. I called patient and left a message I had canceled appointment for CT."    Placed new order for CT will send message to [facility] to reschedule at [facility].

## 2019-02-18 ENCOUNTER — Other Ambulatory Visit: Payer: Self-pay | Admitting: Internal Medicine

## 2019-02-26 ENCOUNTER — Encounter: Payer: Self-pay | Admitting: Internal Medicine

## 2019-02-26 NOTE — Patient Instructions (Signed)

## 2019-02-26 NOTE — Progress Notes (Signed)
Annual Screening/Preventative Visit & Comprehensive Evaluation &  Examination     This very nice 68 y.o. MWF presents for a Screening /Preventative Visit & comprehensive evaluation and management of multiple medical co-morbidities.  Patient has been followed for HTN, HLD, Prediabetes  and Vitamin D Deficiency.     Patient also has concerns re: a pink raised lesion of the Left Upper Chest that has appeared and increased in size over the last 2 weeks.       HTN predates circa 1996. Patient's BP has been controlled at home and patient denies any cardiac symptoms as chest pain, palpitations, shortness of breath, dizziness or ankle swelling. Today's BP is at goal - 138/76 Patient has hx/o a Normal Cardiolite in 2004. Patient had an episode of pAfib in 2015 and is on Eliquis for CHADs2VASc 3. Patient is followed by Truitt Merle, NP & Dr [name] and last event monitor in 2018 showed only occasional PAC's.  CTA in Jan 2019 did show a 4.2 x 4.4 cm ascending Thoracic Aneurysm.     Patient's hyperlipidemia is controlled with diet & FlaxSeed. Last lipids were at goal albeit elevated Trig's:  Lab Results  Component Value Date   CHOL 168 11/13/2018   HDL 50 11/13/2018   LDLCALC 88 11/13/2018   TRIG 200 (H) 11/13/2018   CHOLHDL 3.4 11/13/2018      Patient has Moderate Obesity  (BMI 39+) and hx/o prediabetes (A1c 5.8% / 2012)   and patient denies reactive hypoglycemic symptoms, visual blurring, diabetic polys or paresthesias.  Patient has intentionally lost 43# from 250# to current 207# over the last year on a Weight Watcher Diet. Last A1c was Normal & at goal:  Lab Results  Component Value Date   HGBA1C 5.4 11/13/2018      Finally, patient has history of Vitamin D Deficiency ("18" / 2008)  and last Vitamin D was at goal:  Lab Results  Component Value Date   VD25OH 79 11/13/2018   Current Outpatient Medications on File Prior to Visit  Medication Sig  . alendronate (FOSAMAX) 70 MG tablet TAKE 1  TABLET WEEKLY ON AN EMPTY STOMACH WITH ONLY A FULL GLASS OF WATER FOR 60 MINUTES  . atenolol (TENORMIN) 50 MG tablet TAKE ONE-HALF (1/2) TABLET (25 MG) DAILY, MAY TAKE EXTRA ONE-HALF (1/2) TABLET AS NEEDED FOR PALPITATIONS  . Cholecalciferol (VITAMIN D-3 PO) Take 8,000 Units by mouth daily.   Marland Kitchen CINNAMON PO Take 1,000 mg by mouth daily.   . diclofenac sodium (VOLTAREN) 1 % GEL Apply 4 g topically 4 (four) times daily.  . DiphenhydrAMINE HCl (BENADRYL ALLERGY PO) Take by mouth as needed (allergies).   Marland Kitchen ELIQUIS 5 MG TABS tablet TAKE 1 TABLET TWICE A DAY  . Flaxseed, Linseed, (FLAX SEEDS PO) Take 1,000 mg by mouth 2 (two) times daily.  . hydrochlorothiazide (HYDRODIURIL) 25 MG tablet Take 1 tablet Daily for BP & Fluid Retention / Ankle swelling  . loperamide (IMODIUM) 2 MG capsule Take 4 mg by mouth every other day.  Marland Kitchen PREMPRO 0.625-2.5 MG tablet TAKE AS DIRECTED  . quinapril (ACCUPRIL) 40 MG tablet TAKE 1 TABLET DAILY  . vitamin B-12 (CYANOCOBALAMIN) 1000 MCG tablet Take 1,000 mcg by mouth daily.  . [DISCONTINUED] Hyoscyamine Sulfate SL (LEVSIN/SL) 0.125 MG SUBL Place 0.125 mg under the tongue every 4 (four) hours as needed (diarrhea).   No current facility-administered medications on file prior to visit.    Allergies  Allergen Reactions  . Ciprofloxacin   . Levaquin [  Levofloxacin In D5w]     No fluroquinolones due to ascending thoracic aortic aneurysm  . Penicillins Other (See Comments)    Had as a child=unknown reaction  . Zoloft [Sertraline Hcl]    Past Medical History:  Diagnosis Date  . Achilles tendonitis    R heal, limits activity  . Allergy   . Anxiety   . Elevated hemoglobin A1c   . Hyperlipidemia   . Hypertension   . IBS (irritable bowel syndrome)   . Insomnia   . Obesity   . Palpitations   . Vitamin D deficiency    Health Maintenance  Topic Date Due  . PNA vac Low Risk Adult (2 of 2 - PPSV23) 01/26/2019  . MAMMOGRAM  01/30/2021  . COLONOSCOPY  11/04/2022  .  TETANUS/TDAP  10/26/2026  . INFLUENZA VACCINE  Completed  . DEXA SCAN  Completed  . Hepatitis C Screening  Completed   Immunization History  Administered Date(s) Administered  . Fluad Quad(high Dose 65+) 12/15/2018  . Influenza Split 02/19/2013  . Influenza, High Dose Seasonal PF 01/25/2018  . Influenza, Seasonal, Injecte, Preservative Fre 03/12/2015  . Influenza,inj,Quad PF,6+ Mos 01/27/2017  . Influenza-Unspecified 04/06/2011  . PPD Test 10/15/2013  . Pneumococcal Conjugate-13 01/25/2018  . Pneumococcal-Unspecified 04/06/1995  . Td 10/25/2016  . Tdap 04/05/2006  . Zoster 07/04/2013   Last Colon -  08/134/2014 - Dr Delfin Edis - due 10 year f/u - Aug 2024  Last MGM - 01/31/2019  Past Surgical History:  Procedure Laterality Date  . CESAREAN SECTION    . CESAREAN SECTION    . CHOLECYSTECTOMY  10 years ago  . TUBAL LIGATION Bilateral    Family History  Problem Relation Age of Onset  . Heart attack Mother   . Stroke Mother   . Heart disease Mother   . Diabetes Mother   . Macular degeneration Mother   . Heart attack Father   . Alcohol abuse Father   . Ovarian cancer Maternal Grandmother   . Colon cancer Neg Hx    Social History   Tobacco Use  . Smoking status: Former Smoker    Quit date: 04/01/2004    Years since quitting: 14.9  . Smokeless tobacco: Never Used  . Tobacco comment: Social smoker times 10 years  Substance Use Topics  . Alcohol use: Yes    Alcohol/week: 0.0 standard drinks    Comment: rarely, < 1 beer per week  . Drug use: No    ROS Constitutional: Denies fever, chills, weight loss/gain, headaches, insomnia,  night sweats, and change in appetite. Does c/o fatigue. Eyes: Denies redness, blurred vision, diplopia, discharge, itchy, watery eyes.  ENT: Denies discharge, congestion, post nasal drip, epistaxis, sore throat, earache, hearing loss, dental pain, Tinnitus, Vertigo, Sinus pain, snoring.  Cardio: Denies chest pain, palpitations, irregular  heartbeat, syncope, dyspnea, diaphoresis, orthopnea, PND, claudication, edema Respiratory: denies cough, dyspnea, DOE, pleurisy, hoarseness, laryngitis, wheezing.  Gastrointestinal: Denies dysphagia, heartburn, reflux, water brash, pain, cramps, nausea, vomiting, bloating, diarrhea, constipation, hematemesis, melena, hematochezia, jaundice, hemorrhoids Genitourinary: Denies dysuria, frequency, urgency, nocturia, hesitancy, discharge, hematuria, flank pain Breast: Breast lumps, nipple discharge, bleeding.  Musculoskeletal: Denies arthralgia, myalgia, stiffness, Jt. Swelling, pain, limp, and strain/sprain. Denies falls. Skin: Denies puritis, rash, hives, warts, acne, eczema, changing in skin lesion Neuro: No weakness, tremor, incoordination, spasms, paresthesia, pain Psychiatric: Denies confusion, memory loss, sensory loss. Denies Depression. Endocrine: Denies change in weight, skin, hair change, nocturia, and paresthesia, diabetic polys, visual blurring, hyper / hypo glycemic  episodes.  Heme/Lymph: No excessive bleeding, bruising, enlarged lymph nodes.  Physical Exam  BP 138/76   Pulse (!) 56   Temp (!) 97.4 F (36.3 C)   Resp 16   Ht 5' 7.75" (1.721 m)   Wt 207 lb 9.6 oz (94.2 kg)   BMI 31.80 kg/m   General Appearance: Well nourished, well groomed and in no apparent distress.  Eyes: PERRLA, EOMs, conjunctiva no swelling or erythema, normal fundi and vessels. Sinuses: No frontal/maxillary tenderness ENT/Mouth: EACs patent / TMs  nl. Nares clear without erythema, swelling, mucoid exudates. Oral hygiene is good. No erythema, swelling, or exudate. Tongue normal, non-obstructing. Tonsils not swollen or erythematous. Hearing normal.  Neck: Supple, thyroid not palpable. No bruits, nodes or JVD. Respiratory: Respiratory effort normal.  BS equal and clear bilateral without rales, rhonci, wheezing or stridor. Cardio: Heart sounds are normal with regular rate and rhythm and no murmurs, rubs or  gallops. Peripheral pulses are normal and equal bilaterally without edema. No aortic or femoral bruits. Chest: symmetric with normal excursions and percussion. Breasts: Deferred by patient to recent MGM Abdomen: Flat, soft with bowel sounds active. Nontender, no guarding, rebound, hernias, masses, or organomegaly.  Lymphatics: Non tender without lymphadenopathy.  Musculoskeletal: Full ROM all peripheral extremities, joint stability, 5/5 strength, and normal gait. Skin: Warm and dry without rashes, lesions, cyanosis, clubbing or  ecchymosis. There is a 6 mm pink raised fleshy lesion of the Left upper chest.  ....................................................................................................................................................................... Procedure ( CPT- [id number])   After informed consent and aseptic prep with alcohol, the above lesion was infilterated circumferentially with 3 ml Marcaine 0.5%. Then with a #10 scalp[el the lesion was excised by excisional shave technique. Then the wound base was lightly hyfrecated for hemostasis. Antibiotic ung and a 2" x 3" Tegaderm applied . Patient was instructed in post-op wound care. .......................................................................................................................................................................  Neuro: Cranial nerves intact, reflexes equal bilaterally. Normal muscle tone, no cerebellar symptoms. Sensation intact.  Pysch: Alert and oriented x  3, normal affect, Insight and Judgment appropriate.   Assessment and Plan  1. Essential hypertension  - Urinalysis, Routine w reflex microscopic - Microalbumin / Creatinine Urine Ratio - CBC with Diff - COMPLETE METABOLIC PANEL WITH GFR - Magnesium - TSH  2. Hyperlipidemia, mixed  - Lipid Profile - TSH  3. Abnormal glucose  - Hemoglobin A1c ([facility]) - Insulin, random  4. Vitamin D deficiency  - Vitamin D (25  hydroxy)  5. Prediabetes  - Hemoglobin A1c ([facility]) - Insulin, random  6. Paroxysmal atrial fibrillation (HCC)  - TSH  7. Class 2 severe obesity due to excess calories with serious comorbidity and body mass index (BMI) of 38.0 to 38.9 in adult ([place])   8. Thoracic aortic aneurysm without rupture ([place])   9. Screening for colorectal cancer  - POC Hemoccult Bld/Stl  10. Screening for ischemic heart disease   11. FHx: heart disease   12. Former smoker   36. Medication management  - Urinalysis, Routine w reflex microscopic - Microalbumin / Creatinine Urine Ratio - CBC with Diff - COMPLETE METABOLIC PANEL WITH GFR - Magnesium - Lipid Profile - TSH - Hemoglobin A1c ([facility]) - Insulin, random - Vitamin D (25 hydroxy)  14. Neoplasm of uncertain behavior of skin  - Derm pathology         Patient was counseled in prudent diet to achieve/maintain BMI less than 25 for weight control, BP monitoring, regular exercise and medications. Discussed med's effects and SE's. Screening labs and tests as requested  with regular follow-up as recommended. Over 40 minutes of exam, counseling, chart review and high complex critical decision making was performed.   Kirtland Bouchard, MD

## 2019-02-27 ENCOUNTER — Ambulatory Visit (INDEPENDENT_AMBULATORY_CARE_PROVIDER_SITE_OTHER): Payer: Medicare Other | Admitting: Internal Medicine

## 2019-02-27 ENCOUNTER — Other Ambulatory Visit: Payer: Self-pay

## 2019-02-27 ENCOUNTER — Other Ambulatory Visit: Payer: Self-pay | Admitting: Internal Medicine

## 2019-02-27 VITALS — BP 138/76 | HR 56 | Temp 97.4°F | Resp 16 | Ht 67.75 in | Wt 207.6 lb

## 2019-02-27 DIAGNOSIS — I480 Paroxysmal atrial fibrillation: Secondary | ICD-10-CM | POA: Diagnosis not present

## 2019-02-27 DIAGNOSIS — E559 Vitamin D deficiency, unspecified: Secondary | ICD-10-CM

## 2019-02-27 DIAGNOSIS — Z79899 Other long term (current) drug therapy: Secondary | ICD-10-CM

## 2019-02-27 DIAGNOSIS — R7309 Other abnormal glucose: Secondary | ICD-10-CM

## 2019-02-27 DIAGNOSIS — Z8249 Family history of ischemic heart disease and other diseases of the circulatory system: Secondary | ICD-10-CM

## 2019-02-27 DIAGNOSIS — I10 Essential (primary) hypertension: Secondary | ICD-10-CM | POA: Diagnosis not present

## 2019-02-27 DIAGNOSIS — E782 Mixed hyperlipidemia: Secondary | ICD-10-CM

## 2019-02-27 DIAGNOSIS — D485 Neoplasm of uncertain behavior of skin: Secondary | ICD-10-CM

## 2019-02-27 DIAGNOSIS — Z87891 Personal history of nicotine dependence: Secondary | ICD-10-CM

## 2019-02-27 DIAGNOSIS — Z1211 Encounter for screening for malignant neoplasm of colon: Secondary | ICD-10-CM

## 2019-02-27 DIAGNOSIS — R7303 Prediabetes: Secondary | ICD-10-CM | POA: Diagnosis not present

## 2019-02-27 DIAGNOSIS — Z1212 Encounter for screening for malignant neoplasm of rectum: Secondary | ICD-10-CM

## 2019-02-27 DIAGNOSIS — I712 Thoracic aortic aneurysm, without rupture, unspecified: Secondary | ICD-10-CM

## 2019-02-27 DIAGNOSIS — Z136 Encounter for screening for cardiovascular disorders: Secondary | ICD-10-CM

## 2019-02-28 LAB — INSULIN, RANDOM: Insulin: 5.6 u[IU]/mL

## 2019-02-28 LAB — MICROALBUMIN / CREATININE URINE RATIO
Creatinine, Urine: 76 mg/dL (ref 20–275)
Microalb, Ur: <0.2 mg/dL

## 2019-02-28 LAB — CBC WITH DIFFERENTIAL/PLATELET
Absolute Monocytes: 639 cells/uL (ref 200–950)
Basophils Absolute: 41 cells/uL (ref 0–200)
Basophils Relative: 0.6 %
Eosinophils Absolute: 122 cells/uL (ref 15–500)
Eosinophils Relative: 1.8 %
HCT: 40 % (ref 35.0–45.0)
Hemoglobin: 13.4 g/dL (ref 11.7–15.5)
Lymphs Abs: 1822 cells/uL (ref 850–3900)
MCH: 30.5 pg (ref 27.0–33.0)
MCHC: 33.5 g/dL (ref 32.0–36.0)
MCV: 90.9 fL (ref 80.0–100.0)
MPV: 11.6 fL (ref 7.5–12.5)
Monocytes Relative: 9.4 %
Neutro Abs: 4175 cells/uL (ref 1500–7800)
Neutrophils Relative %: 61.4 %
Platelets: 303 10*3/uL (ref 140–400)
RBC: 4.4 10*6/uL (ref 3.80–5.10)
RDW: 11.8 % (ref 11.0–15.0)
Total Lymphocyte: 26.8 %
WBC: 6.8 10*3/uL (ref 3.8–10.8)

## 2019-02-28 LAB — COMPLETE METABOLIC PANEL WITH GFR
AG Ratio: 1.2 (calc) (ref 1.0–2.5)
ALT: 13 U/L (ref 6–29)
AST: 17 U/L (ref 10–35)
Albumin: 3.9 g/dL (ref 3.6–5.1)
Alkaline phosphatase (APISO): 43 U/L (ref 37–153)
BUN: 16 mg/dL (ref 7–25)
CO2: 25 mmol/L (ref 20–32)
Calcium: 9.3 mg/dL (ref 8.6–10.4)
Chloride: 100 mmol/L (ref 98–110)
Creat: 0.74 mg/dL (ref 0.50–0.99)
GFR, Est African American: 98 mL/min/{1.73_m2} (ref >=60)
GFR, Est Non African American: 84 mL/min/{1.73_m2} (ref >=60)
Globulin: 3.3 g/dL (calc) (ref 1.9–3.7)
Glucose, Bld: 92 mg/dL (ref 65–99)
Potassium: 4.1 mmol/L (ref 3.5–5.3)
Sodium: 136 mmol/L (ref 135–146)
Total Bilirubin: 0.5 mg/dL (ref 0.2–1.2)
Total Protein: 7.2 g/dL (ref 6.1–8.1)

## 2019-02-28 LAB — URINALYSIS, ROUTINE W REFLEX MICROSCOPIC
Bilirubin Urine: NEGATIVE
Glucose, UA: NEGATIVE
Hgb urine dipstick: NEGATIVE
Ketones, ur: NEGATIVE
Leukocytes,Ua: NEGATIVE
Nitrite: NEGATIVE
Protein, ur: NEGATIVE
Specific Gravity, Urine: 1.014 (ref 1.001–1.03)
pH: 5.5 (ref 5.0–8.0)

## 2019-02-28 LAB — LIPID PANEL
Cholesterol: 178 mg/dL (ref <200)
HDL: 53 mg/dL (ref >=50)
LDL Cholesterol (Calc): 94 mg/dL (calc)
Non-HDL Cholesterol (Calc): 125 mg/dL (calc) (ref <130)
Total CHOL/HDL Ratio: 3.4 (calc) (ref <5.0)
Triglycerides: 217 mg/dL — ABNORMAL HIGH (ref <150)

## 2019-02-28 LAB — HEMOGLOBIN A1C
Hgb A1c MFr Bld: 5.3 % of total Hgb (ref <5.7)
Mean Plasma Glucose: 105 (calc)
eAG (mmol/L): 5.8 (calc)

## 2019-02-28 LAB — VITAMIN D 25 HYDROXY (VIT D DEFICIENCY, FRACTURES): Vit D, 25-Hydroxy: 76 ng/mL (ref 30–100)

## 2019-02-28 LAB — MAGNESIUM: Magnesium: 2.1 mg/dL (ref 1.5–2.5)

## 2019-02-28 LAB — TSH: TSH: 1.29 mIU/L (ref 0.40–4.50)

## 2019-03-12 ENCOUNTER — Other Ambulatory Visit: Payer: Self-pay | Admitting: Internal Medicine

## 2019-03-28 ENCOUNTER — Encounter: Payer: Self-pay | Admitting: Internal Medicine

## 2019-04-17 ENCOUNTER — Other Ambulatory Visit: Payer: Self-pay

## 2019-04-17 ENCOUNTER — Other Ambulatory Visit: Payer: Medicare Other

## 2019-04-17 DIAGNOSIS — I480 Paroxysmal atrial fibrillation: Secondary | ICD-10-CM

## 2019-04-17 DIAGNOSIS — I712 Thoracic aortic aneurysm, without rupture, unspecified: Secondary | ICD-10-CM

## 2019-04-17 LAB — BASIC METABOLIC PANEL WITH GFR
BUN/Creatinine Ratio: 17 (ref 12–28)
BUN: 13 mg/dL (ref 8–27)
CO2: 24 mmol/L (ref 20–29)
Calcium: 9.4 mg/dL (ref 8.7–10.3)
Chloride: 98 mmol/L (ref 96–106)
Creatinine, Ser: 0.75 mg/dL (ref 0.57–1.00)
GFR calc Af Amer: 96 mL/min/{1.73_m2}
GFR calc non Af Amer: 83 mL/min/{1.73_m2}
Glucose: 95 mg/dL (ref 65–99)
Potassium: 4.8 mmol/L (ref 3.5–5.2)
Sodium: 136 mmol/L (ref 134–144)

## 2019-04-24 ENCOUNTER — Ambulatory Visit (HOSPITAL_COMMUNITY)
Admission: RE | Admit: 2019-04-24 | Discharge: 2019-04-24 | Disposition: A | Discharge status: Home / Self Care | Payer: Medicare Other | Source: Ambulatory Visit | Attending: Cardiovascular Disease | Admitting: Cardiovascular Disease

## 2019-04-24 ENCOUNTER — Inpatient Hospital Stay: Admission: RE | Admit: 2019-04-24 | Payer: Medicare Other | Source: Ambulatory Visit

## 2019-04-24 ENCOUNTER — Other Ambulatory Visit: Payer: Self-pay

## 2019-04-24 DIAGNOSIS — I712 Thoracic aortic aneurysm, without rupture, unspecified: Secondary | ICD-10-CM

## 2019-04-24 MED ORDER — IOHEXOL 350 MG/ML SOLN
100.0000 mL | Freq: Once | INTRAVENOUS | Status: AC | PRN
  Administered 2019-04-24: 100 mL via INTRAVENOUS

## 2019-04-27 ENCOUNTER — Ambulatory Visit: Payer: Medicare Other | Attending: Internal Medicine

## 2019-04-27 DIAGNOSIS — Z23 Encounter for immunization: Secondary | ICD-10-CM | POA: Insufficient documentation

## 2019-04-27 NOTE — Progress Notes (Signed)
   Covid-19 Vaccination Clinic  Name:  Teosha Adi    MRN: [id number] DOB: [birth year]  04/27/2019  Ms. Kreeger was observed post Covid-19 immunization for 15 minutes without incidence. She was provided with Vaccine Information Sheet and instruction to access the V-Safe system.   Ms. Armbrecht was instructed to call 911 with any severe reactions post vaccine: Marland Kitchen Difficulty breathing  . Swelling of your face and throat  . A fast heartbeat  . A bad rash all over your body  . Dizziness and weakness    Immunizations Administered    Name Date Dose VIS Date Route   Pfizer COVID-19 Vaccine 04/27/2019 10:40 AM 0.3 mL 03/16/2019 Intramuscular   Manufacturer: [employer], [employer]   Lot: [id number]   [place]: [id number]

## 2019-05-18 ENCOUNTER — Ambulatory Visit: Payer: Medicare Other | Attending: Internal Medicine

## 2019-05-18 DIAGNOSIS — Z23 Encounter for immunization: Secondary | ICD-10-CM | POA: Insufficient documentation

## 2019-05-18 NOTE — Progress Notes (Signed)
   Covid-19 Vaccination Clinic  Name:  Claudia [name]    MRN: [id number] DOB: May 16, 1950  05/18/2019  [name] was observed post Covid-19 immunization for 15 minutes without incidence. She was provided with Vaccine Information Sheet and instruction to access the V-Safe system.   Ms. Birth was instructed to call 911 with any severe reactions post vaccine: Marland Kitchen Difficulty breathing  . Swelling of your face and throat  . A fast heartbeat  . A bad rash all over your body  . Dizziness and weakness    Immunizations Administered    Name Date Dose VIS Date Route   Pfizer COVID-19 Vaccine 05/18/2019 12:35 PM 0.3 mL 03/16/2019 Intramuscular   Manufacturer: [place]   Lot: [id number]   [place]: [id number]

## 2019-06-05 ENCOUNTER — Ambulatory Visit: Payer: Medicare Other | Admitting: Adult Health

## 2019-06-05 DIAGNOSIS — I700 Atherosclerosis of aorta: Secondary | ICD-10-CM | POA: Insufficient documentation

## 2019-06-05 NOTE — Progress Notes (Signed)
FOLLOW UP  Assessment and Plan:   Atherosclerosis of aorta Per CT 04/2019 Control blood pressure, cholesterol, glucose, increase exercise.   Thoracic aortic aneurysm (HCC) Control BP; monitored annually by cardiology; follow  Paroxysmal atrial fibrillation ([place]) Continue meds, continue cardio follow up, no symptoms at this time.  Rate controlled, continue elequis.   SVT (supraventricular tachycardia) (HCC) Continue meds, continue cardio follow up, no symptoms at this time.   Hypertension Well controlled with current medications  Monitor blood pressure at home; patient to call if consistently greater than 130/80 Continue DASH diet.   Reminder to go to the ER if any CP, SOB, nausea, dizziness, severe HA, changes vision/speech, left arm numbness and tingling and jaw pain.  Cholesterol Currently at LDL goal without medication; diet for trigs discussed, suggested omega 3 Continue low cholesterol diet and exercise.  Check lipid panel.   Hx of prediabetes Recent A1Cs at goal  Continue diet and exercise.  Perform daily foot/skin check, notify office of any concerning changes.  Defer A1C; monitor weight and serum glucose   Obesity with co morbidities Long discussion about weight loss, diet, and exercise Recommended diet heavy in fruits and veggies and low in animal meats, cheeses, and dairy products, appropriate calorie intake Discussed ideal weight for height  Patient will work on continue [employer], watch portions, increase exercise Will follow up in 3 months  Vitamin D Def At goal at last visit; continue supplementation to maintain goal of 60-100 Defer Vit D level  Insomnia good sleep hygiene discussed, increase day time activity Doing well with benadryl   Continue diet and meds as discussed. Further disposition pending results of labs. Discussed med's effects and SE's.   Over 30 minutes of exam, counseling, chart review, and critical decision making was  performed.   Future Appointments  Date Time Provider [place]  09/13/2019 10:00 AM Liane Comber, NP [facility] None  03/26/2020  3:00 PM Unk Pinto, MD [facility] None    ----------------------------------------------------------------------------------------------------------------------  HPI 69 y.o. female  presents for 3 month follow up on hypertension, cholesterol, diabetes, weight and vitamin D deficiency.   She has IBS; She will have diarrhea frequently, takes imodium every other day, colonoscopy 2014 Dr. Olevia Perches normal. No pain with diarrhea, just urgency. Sp gallbladder removal.   She takes benadryl at night for sleep; had been prescribed xanax for anxiety but was able to taper off due to rare use.   She reports last week had dark red vaginal spotting "like a period" for approx 1 week, has [place] and follow up scheduled with GYN for this.   BMI is Body mass index is 31.8 kg/m., she has been working on diet, has switched to weight watchers, down from peak weight of 254 lb. Walks some but limited by advanced knee arthritis (follows with Dr. Norm Salt, getting gel injections). Wt Readings from Last 3 Encounters:  06/06/19 207 lb 9.6 oz (94.2 kg)  02/27/19 207 lb 9.6 oz (94.2 kg)  01/17/19 210 lb (95.3 kg)   She has been following with [facility] Dr. [name], has Afib, controlled SVT, Thoracic aortic anuerysm, on elliquis.  Has CTA annually; most recent in 04/2019 showed aneurysm 3.8 cm; also shows aortic atherosclerosis.  Her blood pressure has been controlled at home, today their BP is BP: 130/74  She does not workout. She denies chest pain, shortness of breath, dizziness.   She is not on cholesterol medication and denies myalgias. Her LDL cholesterol is at goal. Trigs mildly elevated, taking flax  seed oil. The cholesterol last visit was:   Lab Results  Component Value Date   CHOL 178 02/27/2019   HDL 53 02/27/2019   LDLCALC 94 02/27/2019   TRIG 217 (H)  02/27/2019   CHOLHDL 3.4 02/27/2019    She has been working on diet and exercise for hx of prediabetes (A1C 5.8% in 2016 and 2017), and denies foot ulcerations, increased appetite, nausea, paresthesia of the feet, polydipsia, polyuria, visual disturbances, vomiting and weight loss. Last A1C in the office was:  Lab Results  Component Value Date   HGBA1C 5.3 02/27/2019    Last GFR:  Lab Results  Component Value Date   GFRNONAA 83 04/17/2019   Patient is on Vitamin D supplement.   Lab Results  Component Value Date   VD25OH 76 02/27/2019        Current Medications:  Current Outpatient Medications on File Prior to Visit  Medication Sig  . alendronate (FOSAMAX) 70 MG tablet TAKE 1 TABLET WEEKLY ON AN EMPTY STOMACH WITH ONLY A FULL GLASS OF WATER FOR 60 MINUTES  . Ascorbic Acid (VITAMIN C PO) Take by mouth 2 (two) times daily.  Marland Kitchen atenolol (TENORMIN) 50 MG tablet TAKE ONE-HALF (1/2) TABLET (25 MG) DAILY, MAY TAKE EXTRA ONE-HALF (1/2) TABLET AS NEEDED FOR PALPITATIONS  . Cholecalciferol (VITAMIN D-3 PO) Take 8,000 Units by mouth daily.   Marland Kitchen CINNAMON PO Take 1,000 mg by mouth daily.   . diclofenac sodium (VOLTAREN) 1 % GEL Apply 4 g topically 4 (four) times daily.  . DiphenhydrAMINE HCl (BENADRYL ALLERGY PO) Take by mouth as needed (allergies).   Marland Kitchen ELIQUIS 5 MG TABS tablet TAKE 1 TABLET TWICE A DAY  . Ferrous Sulfate (IRON PO) Take 65 mg by mouth daily.  . Flaxseed, Linseed, (FLAX SEEDS PO) Take 1,000 mg by mouth 2 (two) times daily.  . hydrochlorothiazide (HYDRODIURIL) 25 MG tablet Take 1 tablet Daily for BP & Fluid Retention / Ankle swelling  . loperamide (IMODIUM) 2 MG capsule Take 4 mg by mouth every other day.  Marland Kitchen PREMPRO 0.625-2.5 MG tablet TAKE AS DIRECTED  . quinapril (ACCUPRIL) 40 MG tablet TAKE 1 TABLET DAILY  . vitamin B-12 (CYANOCOBALAMIN) 1000 MCG tablet Take 1,000 mcg by mouth daily.  . [DISCONTINUED] Hyoscyamine Sulfate SL (LEVSIN/SL) 0.125 MG SUBL Place 0.125 mg under the  tongue every 4 (four) hours as needed (diarrhea).   No current facility-administered medications on file prior to visit.     Allergies:  Allergies  Allergen Reactions  . Ciprofloxacin   . Levaquin [Levofloxacin In D5w]     No fluroquinolones due to ascending thoracic aortic aneurysm  . Penicillins Other (See Comments)    Had as a child=unknown reaction  . Zoloft [Sertraline Hcl]      Medical History:  Past Medical History:  Diagnosis Date  . Achilles tendonitis    R heal, limits activity  . Allergy   . Anxiety   . Elevated hemoglobin A1c   . Hyperlipidemia   . Hypertension   . IBS (irritable bowel syndrome)   . Insomnia   . Obesity   . Palpitations   . Vitamin D deficiency    Family history- Reviewed and unchanged Social history- Reviewed and unchanged   Review of Systems:  Review of Systems  Constitutional: Negative for malaise/fatigue and weight loss.  HENT: Negative for hearing loss and tinnitus.   Eyes: Negative for blurred vision and double vision.  Respiratory: Negative for cough, shortness of breath and wheezing.  Cardiovascular: Positive for palpitations (Intermittent, can feel a. fib). Negative for chest pain, orthopnea, claudication and leg swelling.  Gastrointestinal: Negative for abdominal pain, blood in stool, constipation, diarrhea, heartburn, melena, nausea and vomiting.  Genitourinary: Negative.   Musculoskeletal: Negative for joint pain and myalgias.  Skin: Negative for rash.  Neurological: Negative for dizziness, tingling, sensory change, weakness and headaches.  Endo/Heme/Allergies: Negative for polydipsia.  Psychiatric/Behavioral: Negative.   All other systems reviewed and are negative.     Physical Exam: BP 130/74   Pulse (!) 51   Temp (!) 97 F (36.1 C)   Wt 207 lb 9.6 oz (94.2 kg)   SpO2 98%   BMI 31.80 kg/m  Wt Readings from Last 3 Encounters:  06/06/19 207 lb 9.6 oz (94.2 kg)  02/27/19 207 lb 9.6 oz (94.2 kg)  01/17/19 210  lb (95.3 kg)   General Appearance: Well nourished, in no apparent distress. Eyes: PERRLA, EOMs, conjunctiva no swelling or erythema Sinuses: No Frontal/maxillary tenderness ENT/Mouth: Ext aud canals clear, TMs without erythema, bulging. No erythema, swelling, or exudate on post pharynx.  Tonsils not swollen or erythematous. Hearing normal.  Neck: Supple, thyroid normal.  Respiratory: Respiratory effort normal, BS equal bilaterally without rales, rhonchi, wheezing or stridor.  Cardio: RRR with no MRGs. Brisk peripheral pulses without edema.  Abdomen: Soft, + BS.  Non tender, no guarding, rebound, hernias, masses. Lymphatics: Non tender without lymphadenopathy.  Musculoskeletal: Full ROM, 5/5 strength, Normal gait Skin: Warm, dry without rashes, lesions, ecchymosis.  Neuro: Cranial nerves intact. No cerebellar symptoms.  Psych: Awake and oriented X 3, normal affect, Insight and Judgment appropriate.    Izora Ribas, NP 12:17 PM [facility]

## 2019-06-06 ENCOUNTER — Ambulatory Visit (INDEPENDENT_AMBULATORY_CARE_PROVIDER_SITE_OTHER): Payer: Medicare Other | Admitting: Adult Health

## 2019-06-06 ENCOUNTER — Encounter: Payer: Self-pay | Admitting: Adult Health

## 2019-06-06 ENCOUNTER — Other Ambulatory Visit: Payer: Self-pay

## 2019-06-06 VITALS — BP 130/74 | HR 51 | Temp 97.0°F | Wt 207.6 lb

## 2019-06-06 DIAGNOSIS — I480 Paroxysmal atrial fibrillation: Secondary | ICD-10-CM | POA: Diagnosis not present

## 2019-06-06 DIAGNOSIS — I10 Essential (primary) hypertension: Secondary | ICD-10-CM

## 2019-06-06 DIAGNOSIS — G4700 Insomnia, unspecified: Secondary | ICD-10-CM | POA: Diagnosis not present

## 2019-06-06 DIAGNOSIS — E669 Obesity, unspecified: Secondary | ICD-10-CM

## 2019-06-06 DIAGNOSIS — E559 Vitamin D deficiency, unspecified: Secondary | ICD-10-CM | POA: Diagnosis not present

## 2019-06-06 DIAGNOSIS — Z79899 Other long term (current) drug therapy: Secondary | ICD-10-CM | POA: Diagnosis not present

## 2019-06-06 DIAGNOSIS — I712 Thoracic aortic aneurysm, without rupture, unspecified: Secondary | ICD-10-CM

## 2019-06-06 DIAGNOSIS — E782 Mixed hyperlipidemia: Secondary | ICD-10-CM | POA: Diagnosis not present

## 2019-06-06 DIAGNOSIS — I471 Supraventricular tachycardia: Secondary | ICD-10-CM | POA: Diagnosis not present

## 2019-06-06 DIAGNOSIS — I700 Atherosclerosis of aorta: Secondary | ICD-10-CM

## 2019-06-06 NOTE — Patient Instructions (Addendum)
Goals    . DIET - INCREASE WATER INTAKE     65-80+ fluid ounces of clear sugar free liquid    . Weight (lb) < 180 lb (81.6 kg)        High Triglycerides Eating Plan Triglycerides are a type of fat in the blood. High levels of triglycerides can increase your risk of heart disease and stroke. If your triglyceride levels are high, choosing the right foods can help lower your triglycerides and keep your heart healthy. Work with your health care provider or a diet and nutrition specialist (dietitian) to develop an eating plan that is right for you. What are tips for following this plan? General guidelines   Lose weight, if you are overweight. For most people, losing 5-10 lbs (2-5 kg) helps lower triglyceride levels. A weight-loss plan may include. ? 30 minutes of exercise at least 5 days a week. ? Reducing the amount of calories, sugar, and fat you eat.  Eat a wide variety of fresh fruits, vegetables, and whole grains. These foods are high in fiber.  Eat foods that contain healthy fats, such as fatty fish, nuts, seeds, and olive oil.  Avoid foods that are high in added sugar, added salt (sodium), saturated fat, and trans fat.  Avoid low-fiber, refined carbohydrates such as white bread, crackers, noodles, and white rice.  Avoid foods with partially hydrogenated oils (trans fats), such as fried foods or stick margarine.  Limit alcohol intake to no more than 1 drink a day for nonpregnant women and 2 drinks a day for men. One drink equals 12 oz of beer, 5 oz of wine, or 1 oz of hard liquor. Your health care provider may recommend that you drink less depending on your overall health. Reading food labels  Check food labels for the amount of saturated fat. Choose foods with no or very little saturated fat.  Check food labels for the amount of trans fat. Choose foods with no trans fat.  Check food labels for the amount of cholesterol. Choose foods low in cholesterol. Ask your dietitian how  much cholesterol you should have each day.  Check food labels for the amount of sodium. Choose foods with less than 140 milligrams (mg) per serving. Shopping  Buy dairy products labeled as nonfat (skim) or low-fat (1%).  Avoid buying processed or prepackaged foods. These are often high in added sugar, sodium, and fat. Cooking  Choose healthy fats when cooking, such as olive oil or canola oil.  Cook foods using lower fat methods, such as baking, broiling, boiling, or grilling.  Make your own sauces, dressings, and marinades when possible, instead of buying them. Store-bought sauces, dressings, and marinades are often high in sodium and sugar. Meal planning  Eat more home-cooked food and less restaurant, buffet, and fast food.  Eat fatty fish at least 2 times each week. Examples of fatty fish include salmon, trout, mackerel, tuna, and herring.  If you eat whole eggs, do not eat more than 3 egg yolks per week. What foods are recommended? The items listed may not be a complete list. Talk with your dietitian about what dietary choices are best for you. Grains Whole wheat or whole grain breads, crackers, cereals, and pasta. Unsweetened oatmeal. Bulgur. Barley. Quinoa. Brown rice. Whole wheat flour tortillas. Vegetables Fresh or frozen vegetables. Low-sodium canned vegetables. Fruits All fresh, canned (in natural juice), or frozen fruits. Meats and other protein foods Skinless chicken or [place]. Ground chicken or [place]. Lean cuts of pork, trimmed  of fat. Fish and seafood, especially salmon, trout, and herring. Egg whites. Dried beans, peas, or lentils. Unsalted nuts or seeds. Unsalted canned beans. Natural peanut or almond butter. Dairy Low-fat dairy products. Skim or low-fat (1%) milk. Reduced fat (2%) and low-sodium cheese. Low-fat ricotta cheese. Low-fat cottage cheese. Plain, low-fat yogurt. Fats and oils Tub margarine without trans fats. Light or reduced-fat mayonnaise. Light or  reduced-fat salad dressings. Avocado. Safflower, olive, sunflower, soybean, and canola oils. What foods are not recommended? The items listed may not be a complete list. Talk with your dietitian about what dietary choices are best for you. Grains White bread. White (regular) pasta. White rice. Cornbread. Bagels. Pastries. Crackers that contain trans fat. Vegetables Creamed or fried vegetables. Vegetables in a cheese sauce. Fruits Sweetened dried fruit. Canned fruit in syrup. Fruit juice. Meats and other protein foods Fatty cuts of meat. Ribs. Chicken wings. Berniece Salines. Sausage. Bologna. Salami. Chitterlings. Fatback. Hot dogs. Bratwurst. Packaged lunch meats. Dairy Whole or reduced-fat (2%) milk. Half-and-half. Cream cheese. Full-fat or sweetened yogurt. Full-fat cheese. Nondairy creamers. Whipped toppings. Processed cheese or cheese spreads. Cheese curds. Beverages Alcohol. Sweetened drinks, such as soda, lemonade, fruit drinks, or punches. Fats and oils Butter. Stick margarine. Lard. Shortening. Ghee. Bacon fat. Tropical oils, such as coconut, palm kernel, or palm oils. Sweets and desserts Corn syrup. Sugars. Honey. Molasses. Candy. Jam and jelly. Syrup. Sweetened cereals. Cookies. Pies. Cakes. Donuts. Muffins. Ice cream. Condiments Store-bought sauces, dressings, and marinades that are high in sugar, such as ketchup and barbecue sauce. Summary  High levels of triglycerides can increase the risk of heart disease and stroke. Choosing the right foods can help lower your triglycerides.  Eat plenty of fresh fruits, vegetables, and whole grains. Choose low-fat dairy and lean meats. Eat fatty fish at least twice a week.  Avoid processed and prepackaged foods with added sugar, sodium, saturated fat, and trans fat.  If you need suggestions or have questions about what types of food are good for you, talk with your health care provider or a dietitian. This information is not intended to replace  advice given to you by your health care provider. Make sure you discuss any questions you have with your health care provider. Document Revised: 03/04/2017 Document Reviewed: 05/25/2016 Elsevier Patient Education  2020 [employer].

## 2019-06-07 LAB — COMPLETE METABOLIC PANEL WITH GFR
AG Ratio: 1.3 (calc) (ref 1.0–2.5)
ALT: 13 U/L (ref 6–29)
AST: 17 U/L (ref 10–35)
Albumin: 4 g/dL (ref 3.6–5.1)
Alkaline phosphatase (APISO): 39 U/L (ref 37–153)
BUN: 12 mg/dL (ref 7–25)
CO2: 29 mmol/L (ref 20–32)
Calcium: 9.4 mg/dL (ref 8.6–10.4)
Chloride: 101 mmol/L (ref 98–110)
Creat: 0.73 mg/dL (ref 0.50–0.99)
GFR, Est African American: 99 mL/min/{1.73_m2} (ref >=60)
GFR, Est Non African American: 86 mL/min/{1.73_m2} (ref >=60)
Globulin: 3 g/dL (calc) (ref 1.9–3.7)
Glucose, Bld: 102 mg/dL — ABNORMAL HIGH (ref 65–99)
Potassium: 4.4 mmol/L (ref 3.5–5.3)
Sodium: 138 mmol/L (ref 135–146)
Total Bilirubin: 0.6 mg/dL (ref 0.2–1.2)
Total Protein: 7 g/dL (ref 6.1–8.1)

## 2019-06-07 LAB — CBC WITH DIFFERENTIAL/PLATELET
Absolute Monocytes: 634 cells/uL (ref 200–950)
Basophils Absolute: 32 cells/uL (ref 0–200)
Basophils Relative: 0.5 %
Eosinophils Absolute: 102 cells/uL (ref 15–500)
Eosinophils Relative: 1.6 %
HCT: 39.7 % (ref 35.0–45.0)
Hemoglobin: 13.5 g/dL (ref 11.7–15.5)
Lymphs Abs: 1542 cells/uL (ref 850–3900)
MCH: 30.9 pg (ref 27.0–33.0)
MCHC: 34 g/dL (ref 32.0–36.0)
MCV: 90.8 fL (ref 80.0–100.0)
MPV: 11.4 fL (ref 7.5–12.5)
Monocytes Relative: 9.9 %
Neutro Abs: 4090 cells/uL (ref 1500–7800)
Neutrophils Relative %: 63.9 %
Platelets: 270 10*3/uL (ref 140–400)
RBC: 4.37 10*6/uL (ref 3.80–5.10)
RDW: 11.6 % (ref 11.0–15.0)
Total Lymphocyte: 24.1 %
WBC: 6.4 10*3/uL (ref 3.8–10.8)

## 2019-06-07 LAB — LIPID PANEL
Cholesterol: 165 mg/dL (ref <200)
HDL: 54 mg/dL (ref >=50)
LDL Cholesterol (Calc): 83 mg/dL (calc)
Non-HDL Cholesterol (Calc): 111 mg/dL (calc) (ref <130)
Total CHOL/HDL Ratio: 3.1 (calc) (ref <5.0)
Triglycerides: 180 mg/dL — ABNORMAL HIGH (ref <150)

## 2019-06-07 LAB — TSH: TSH: 1 mIU/L (ref 0.40–4.50)

## 2019-06-07 LAB — MAGNESIUM: Magnesium: 2.1 mg/dL (ref 1.5–2.5)

## 2019-06-11 DIAGNOSIS — N950 Postmenopausal bleeding: Secondary | ICD-10-CM | POA: Diagnosis not present

## 2019-06-13 DIAGNOSIS — M1712 Unilateral primary osteoarthritis, left knee: Secondary | ICD-10-CM | POA: Diagnosis not present

## 2019-06-13 DIAGNOSIS — M1711 Unilateral primary osteoarthritis, right knee: Secondary | ICD-10-CM | POA: Diagnosis not present

## 2019-07-02 ENCOUNTER — Other Ambulatory Visit: Payer: Self-pay | Admitting: Adult Health

## 2019-07-02 DIAGNOSIS — M1711 Unilateral primary osteoarthritis, right knee: Secondary | ICD-10-CM | POA: Diagnosis not present

## 2019-07-02 DIAGNOSIS — M1712 Unilateral primary osteoarthritis, left knee: Secondary | ICD-10-CM | POA: Diagnosis not present

## 2019-07-09 DIAGNOSIS — M1712 Unilateral primary osteoarthritis, left knee: Secondary | ICD-10-CM | POA: Diagnosis not present

## 2019-07-09 DIAGNOSIS — M1711 Unilateral primary osteoarthritis, right knee: Secondary | ICD-10-CM | POA: Diagnosis not present

## 2019-07-16 DIAGNOSIS — M1711 Unilateral primary osteoarthritis, right knee: Secondary | ICD-10-CM | POA: Diagnosis not present

## 2019-07-16 DIAGNOSIS — M1712 Unilateral primary osteoarthritis, left knee: Secondary | ICD-10-CM | POA: Diagnosis not present

## 2019-08-06 ENCOUNTER — Other Ambulatory Visit: Payer: Self-pay | Admitting: Internal Medicine

## 2019-08-06 ENCOUNTER — Other Ambulatory Visit: Payer: Self-pay | Admitting: Adult Health

## 2019-08-29 DIAGNOSIS — N3281 Overactive bladder: Secondary | ICD-10-CM | POA: Diagnosis not present

## 2019-08-29 DIAGNOSIS — N959 Unspecified menopausal and perimenopausal disorder: Secondary | ICD-10-CM | POA: Diagnosis not present

## 2019-08-29 DIAGNOSIS — Z124 Encounter for screening for malignant neoplasm of cervix: Secondary | ICD-10-CM | POA: Diagnosis not present

## 2019-08-30 NOTE — Progress Notes (Signed)
01/30/2021 due DEXA BCGI

## 2019-09-05 DIAGNOSIS — H35373 Puckering of macula, bilateral: Secondary | ICD-10-CM | POA: Diagnosis not present

## 2019-09-10 ENCOUNTER — Ambulatory Visit: Payer: Medicare Other | Admitting: Internal Medicine

## 2019-09-12 NOTE — Progress Notes (Signed)
WELCOME TO MEDICARE ANNUAL WELLNESS VISIT AND FOLLOW UP  Assessment:    Encounter for Medicare annual wellness exam 1 year  Paroxysmal atrial fibrillation ([place]) Follows with cardio, on eliquis  SVT (supraventricular tachycardia) ([place]) Follows with cardio, on eliquis  Thoracic aortic aneurysm without rupture ([place]) Follows with cardio, stable 04/2019 CTA  Atherosclerosis of aorta After discussion, patient preference to initiate low dose statin to reduce plaque prevention - pravastatin 20 mg every other day initiated for LDL goal <70 Also discussed plant based, high fiber, low processed, low animal product diet  Control blood pressure, cholesterol, glucose, weight, increase exercise.   Obesity  - follow up 3 months for progress monitoring - increase veggies, decrease carbs - long discussion about weight loss, diet, and exercise  Mixed hyperlipidemia Pravastatin 20 mg every other day; starting low dose due to patient preference check lipids decrease fatty foods  increase activity.  -     Lipid panel  Medication management -     CBC with Differential/Platelet -     COMPLETE METABOLIC PANEL WITH GFR -     Magnesium  Vitamin D deficiency Continue supplement  Abnormal glucose Discussed disease progression and risks Discussed diet/exercise, weight management and risk modification A1C annually   Insomnia, unspecified type Continue follow up  Irritable bowel syndrome, unspecified type Can continue imodium She is up to date on colonoscopy -     Hyoscyamine Sulfate SL (LEVSIN/SL) 0.125 MG SUBL; Place 0.125 mg under the tongue every 4 (four) hours as needed (diarrhea).  Essential hypertension - continue medications, DASH diet, exercise and monitor at home. Call if greater than 130/80.  -     CBC with Differential/Platelet -     COMPLETE METABOLIC PANEL WITH GFR -     TSH  Localized osteoporosis without current pathological fracture DEXA follow up due 01/2021 continue  Vit D and Ca, weight bearing exercises continue fosamax to 01/2020, then stop for 1 year  Need for pneumococcal vaccine 23 valent due, administered today without complication    Over 40 minutes of exam, counseling, chart review and critical decision making was performed Future Appointments  Date Time Provider [place]  03/26/2020  3:00 PM Unk Pinto, MD [id number] None     Plan:   During the course of the visit the patient was educated and counseled about appropriate screening and preventive services including:    Pneumococcal vaccine   Prevnar 13  Influenza vaccine  Td vaccine  Screening electrocardiogram  Bone densitometry screening  Colorectal cancer screening  Diabetes screening  Glaucoma screening  Nutrition counseling   Advanced directives: requested   Subjective:  Claudia [name] is a 69 y.o. female who presents for Medicare Annual Wellness Visit and 3 month follow up.   She reports dark spots on her face, has used topical tretinoin in the past via derm which worked well, requesting refill.    Her blood pressure has been controlled at home, today their BP is BP: 128/76 She does not workout, 3 days a week, does seated. She denies chest pain, shortness of breath, dizziness.   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 [name] On elequis and atenolol for a. Fib.  She has osteoporosis, started on fosamax x 01/2018.  She will have a BM 2-3 x a day and will have urgency with it. Her husband is now retired and she would like to travel more. She will  take imodium 2 tablets before trips and do well with it.  BMI is Body mass index is 33.14 kg/m., she is working on diet and exercise.  She has joined [employer], her and her husband, has been maintaining, trying to lose another 20 lb, needs to watch portions. Admits not exercising -  Wt  Readings from Last 3 Encounters:  09/13/19 211 lb 9.6 oz (96 kg)  06/06/19 207 lb 9.6 oz (94.2 kg)  02/27/19 207 lb 9.6 oz (94.2 kg)   She is not on cholesterol medication and denies myalgias. Her cholesterol is at goal. However she would like to start cholesterol medication due to aortic plaque. The cholesterol last visit was:   Lab Results  Component Value Date   CHOL 165 06/06/2019   HDL 54 06/06/2019   LDLCALC 83 06/06/2019   TRIG 180 (H) 06/06/2019   CHOLHDL 3.1 06/06/2019   . She has been working on diet and exercise for glucose management, and denies paresthesia of the feet, polydipsia and polyuria. Last A1C in the office was:  Lab Results  Component Value Date   HGBA1C 5.3 02/27/2019   Last GFR: Lab Results  Component Value Date   GFRNONAA 86 06/06/2019   Patient is on Vitamin D supplement.   Lab Results  Component Value Date   VD25OH 76 02/27/2019       Medication Review:  Current Outpatient Medications (Endocrine & Metabolic):  .  alendronate (FOSAMAX) 70 MG tablet, TAKE 1 TABLET WEEKLY ON AN EMPTY STOMACH WITH ONLY A FULL GLASS OF WATER FOR 60 MINUTES .  PREMPRO 0.625-2.5 MG tablet, TAKE AS DIRECTED  Current Outpatient Medications (Cardiovascular):  .  atenolol (TENORMIN) 50 MG tablet, TAKE ONE-HALF (1/2) TABLET (25 MG) DAILY, MAY TAKE EXTRA ONE-HALF (1/2) TABLET AS NEEDED FOR PALPITATIONS .  hydrochlorothiazide (HYDRODIURIL) 25 MG tablet, Take 1 tablet Daily for BP & Fluid Retention / Ankle swelling .  quinapril (ACCUPRIL) 40 MG tablet, Take 1 tablet Daily for BP .  pravastatin (PRAVACHOL) 20 MG tablet, Take 1 tablet (20 mg total) by mouth every other day. Take in the evening for cholesterol and plaque.  Current Outpatient Medications (Respiratory):  Marland Kitchen  DiphenhydrAMINE HCl (BENADRYL ALLERGY PO), Take by mouth as needed (allergies).    Current Outpatient Medications (Hematological):  Marland Kitchen  ELIQUIS 5 MG TABS tablet, TAKE 1 TABLET TWICE A DAY .  vitamin B-12  (CYANOCOBALAMIN) 1000 MCG tablet, Take 1,000 mcg by mouth daily.  Current Outpatient Medications (Other):  Marland Kitchen  Ascorbic Acid (VITAMIN C PO), Take by mouth 2 (two) times daily. .  Cholecalciferol (VITAMIN D-3 PO), Take 8,000 Units by mouth daily.  Marland Kitchen  CINNAMON PO, Take 1,000 mg by mouth daily.  .  diclofenac sodium (VOLTAREN) 1 % GEL, Apply 4 g topically 4 (four) times daily. Marland Kitchen  loperamide (IMODIUM) 2 MG capsule, Take 4 mg by mouth every other day. .  tretinoin (RETIN-A) 0.025 % cream, Apply topically at bedtime.  Allergies Allergies  Allergen Reactions  . Ciprofloxacin   . Levaquin [Levofloxacin In D5w]     No fluroquinolones due to ascending thoracic aortic aneurysm  . Penicillins Other (See Comments)    Had as a child=unknown reaction  . Zoloft [Sertraline Hcl]     Current Problems (verified) Patient Active Problem List   Diagnosis Date Noted  . Aortic atherosclerosis ([place]) 06/05/2019  . Osteoporosis 05/08/2018  . DJD (degenerative joint disease) 05/08/2018  . Thoracic aortic aneurysm ([place]) 08/13/2016  . Obesity (BMI  30.0-34.9) 03/16/2015  . Atrial fibrillation ([place]) 10/29/2013  . SVT (supraventricular tachycardia) ([place]) 10/29/2013  . Medication management 02/18/2013  . Hypertension   . Hyperlipidemia   . Abnormal glucose   . Vitamin D deficiency   . IBS (irritable bowel syndrome)   . Insomnia     Screening Tests Health Maintenance  Topic Date Due  . PNA vac Low Risk Adult (2 of 2 - PPSV23) 01/26/2019  . INFLUENZA VACCINE  11/04/2019  . MAMMOGRAM  01/30/2021  . COLONOSCOPY  11/04/2022  . TETANUS/TDAP  10/26/2026  . DEXA SCAN  Completed  . COVID-19 Vaccine  Completed  . Hepatitis C Screening  Completed   Immunization History  Administered Date(s) Administered  . Fluad Quad(high Dose 65+) 12/15/2018  . Influenza Split 02/19/2013  . Influenza, High Dose Seasonal PF 01/25/2018  . Influenza, Seasonal, Injecte, Preservative Fre 03/12/2015  . Influenza,inj,Quad  PF,6+ Mos 01/27/2017  . Influenza-Unspecified 04/06/2011  . PFIZER SARS-COV-2 Vaccination 04/27/2019, 05/18/2019  . PPD Test 10/15/2013  . Pneumococcal Conjugate-13 01/25/2018  . Pneumococcal-Unspecified 04/06/1995  . Td 10/25/2016  . Tdap 04/05/2006  . Zoster 07/04/2013   Preventative care: Last colonoscopy: 2014 Last mammogram: 01/2019 Last pap smear/pelvic exam: 08/2019 at GYN, normal, DONE DEXA: 01/2019, R fem T -1.7, on fosamax since 01/2018, will stop 01/2020, repeat dexa 01/2021 Stress test 2018 Dr. [name], low risk study EF 86% Echo 2015  Prior vaccinations: TD or Tdap: 2018  Influenza: 12/2018 Pneumococcal: 1997, DUE Prevnar13: 2019  Shingles/Zostavax: 2015  Covid 19: 2/2, 2021, pfizer  Names of Other Physician/Practitioners you currently use: 1. [facility] here for primary care 2. Dr. [place], eye doctor, last visit 2021 3. [facility], Dr. Dayna Barker, dentist, last visit 2020   Patient Care Team: Unk Pinto, MD as PCP - General (Internal Medicine) Josue Hector, MD as Consulting Physician (Cardiology) [place], [place], [place] (Optometry)  SURGICAL HISTORY She  has a past surgical history that includes Cesarean section; Cholecystectomy (10 years ago); Tubal ligation (Bilateral); and Cesarean section. FAMILY HISTORY Her family history includes Alcohol abuse in her father; Diabetes in her mother; Heart attack in her father and mother; Heart disease in her mother; Macular degeneration in her mother; Ovarian cancer in her maternal grandmother; Stroke in her mother. SOCIAL HISTORY She  reports that she quit smoking about 15 years ago. She has never used smokeless tobacco. She reports current alcohol use. She reports that she does not use drugs.   MEDICARE WELLNESS OBJECTIVES: Physical activity: Current Exercise Habits: The patient does not participate in regular exercise at present, Exercise limited by: orthopedic  condition(s) Cardiac risk factors: Cardiac Risk Factors include: advanced age (>51men, >74 women);dyslipidemia;hypertension;obesity (BMI >30kg/m2);sedentary lifestyle;smoking/ tobacco exposure Depression/mood screen:   Depression screen [facility] 2/9 09/13/2019  Decreased Interest 0  Down, Depressed, Hopeless 0  PHQ - 2 Score 0    ADLs:  In your present state of health, do you have any difficulty performing the following activities: 09/13/2019 02/27/2019  Hearing? N -  Vision? N N  Difficulty concentrating or making decisions? N N  Walking or climbing stairs? N N  Dressing or bathing? N N  Doing errands, shopping? N -  Some recent data might be hidden     Cognitive Testing  Alert? Yes  Normal Appearance?Yes  Oriented to person? Yes  Place? Yes   Time? Yes  Recall of three objects?  Yes  Can perform simple calculations? Yes  Displays appropriate judgment?Yes  Can read  the correct time from a watch face?Yes  EOL planning: Does Patient Have a Medical Advance Directive?: No Would patient like information on creating a medical advance directive?: No - Patient declined  Review of Systems  Constitutional: Negative for malaise/fatigue and weight loss.  HENT: Negative for hearing loss and tinnitus.   Eyes: Negative for blurred vision and double vision.  Respiratory: Negative for cough, sputum production, shortness of breath and wheezing.   Cardiovascular: Negative for chest pain, palpitations, orthopnea, claudication, leg swelling and PND.  Gastrointestinal: Negative for abdominal pain, blood in stool, constipation, diarrhea, heartburn, melena, nausea and vomiting.  Genitourinary: Negative.   Musculoskeletal: Negative for falls, joint pain and myalgias.  Skin: Negative for rash.  Neurological: Negative for dizziness, tingling, sensory change, weakness and headaches.  Endo/Heme/Allergies: Negative for polydipsia.  Psychiatric/Behavioral: Negative.  Negative for depression, memory loss,  substance abuse and suicidal ideas. The patient is not nervous/anxious and does not have insomnia.   All other systems reviewed and are negative.    Objective:     Today's Vitals   09/13/19 0953  BP: 128/76  Pulse: (!) 45  Temp: (!) 97.5 F (36.4 C)  SpO2: 98%  Weight: 211 lb 9.6 oz (96 kg)  Height: 5\' 7"  (1.702 m)   Body mass index is 33.14 kg/m.  General appearance: alert, no distress, WD/WN, female HEENT: normocephalic, sclerae anicteric, TMs pearly, nares patent, no discharge or erythema, pharynx normal Oral cavity: MMM, no lesions Neck: supple, no lymphadenopathy, no thyromegaly, no masses Heart: RRR, normal S1, S2, no murmurs Lungs: CTA bilaterally, no wheezes, rhonchi, or rales Abdomen: +bs, soft, non tender, non distended, no masses, no hepatomegaly, no splenomegaly Musculoskeletal: nontender, no swelling, no obvious deformity Extremities: no edema, no cyanosis, no clubbing Pulses: 2+ symmetric, upper and lower extremities, normal cap refill Neurological: alert, oriented x 3, CN2-12 intact, strength normal upper extremities and lower extremities, sensation normal throughout, DTRs 2+ throughout, no cerebellar signs, gait normal Psychiatric: normal affect, behavior normal, pleasant   Medicare Attestation I have personally reviewed: The patient's medical and social history Their use of alcohol, tobacco or illicit drugs Their current medications and supplements The patient's functional ability including ADLs,fall risks, home safety risks, cognitive, and hearing and visual impairment Diet and physical activities Evidence for depression or mood disorders  The patient's weight, height, BMI, and visual acuity have been recorded in the chart.  I have made referrals, counseling, and provided education to the patient based on review of the above and I have provided the patient with a written personalized care plan for preventive services.     Izora Ribas, NP   09/13/2019

## 2019-09-13 ENCOUNTER — Encounter: Payer: Self-pay | Admitting: Adult Health

## 2019-09-13 ENCOUNTER — Ambulatory Visit (INDEPENDENT_AMBULATORY_CARE_PROVIDER_SITE_OTHER): Payer: Medicare Other | Admitting: Adult Health

## 2019-09-13 ENCOUNTER — Other Ambulatory Visit: Payer: Self-pay

## 2019-09-13 VITALS — BP 128/76 | HR 45 | Temp 97.5°F | Ht 67.0 in | Wt 211.6 lb

## 2019-09-13 DIAGNOSIS — I471 Supraventricular tachycardia, unspecified: Secondary | ICD-10-CM

## 2019-09-13 DIAGNOSIS — I10 Essential (primary) hypertension: Secondary | ICD-10-CM

## 2019-09-13 DIAGNOSIS — Z23 Encounter for immunization: Secondary | ICD-10-CM | POA: Diagnosis not present

## 2019-09-13 DIAGNOSIS — I712 Thoracic aortic aneurysm, without rupture, unspecified: Secondary | ICD-10-CM

## 2019-09-13 DIAGNOSIS — M816 Localized osteoporosis [Lequesne]: Secondary | ICD-10-CM

## 2019-09-13 DIAGNOSIS — Z79899 Other long term (current) drug therapy: Secondary | ICD-10-CM | POA: Diagnosis not present

## 2019-09-13 DIAGNOSIS — E669 Obesity, unspecified: Secondary | ICD-10-CM | POA: Diagnosis not present

## 2019-09-13 DIAGNOSIS — Z0000 Encounter for general adult medical examination without abnormal findings: Secondary | ICD-10-CM | POA: Diagnosis not present

## 2019-09-13 DIAGNOSIS — R7309 Other abnormal glucose: Secondary | ICD-10-CM

## 2019-09-13 DIAGNOSIS — R6889 Other general symptoms and signs: Secondary | ICD-10-CM | POA: Diagnosis not present

## 2019-09-13 DIAGNOSIS — E782 Mixed hyperlipidemia: Secondary | ICD-10-CM | POA: Diagnosis not present

## 2019-09-13 DIAGNOSIS — I700 Atherosclerosis of aorta: Secondary | ICD-10-CM | POA: Diagnosis not present

## 2019-09-13 DIAGNOSIS — E559 Vitamin D deficiency, unspecified: Secondary | ICD-10-CM

## 2019-09-13 DIAGNOSIS — E66811 Obesity, class 1: Secondary | ICD-10-CM

## 2019-09-13 DIAGNOSIS — I480 Paroxysmal atrial fibrillation: Secondary | ICD-10-CM | POA: Diagnosis not present

## 2019-09-13 DIAGNOSIS — G4700 Insomnia, unspecified: Secondary | ICD-10-CM

## 2019-09-13 DIAGNOSIS — M170 Bilateral primary osteoarthritis of knee: Secondary | ICD-10-CM | POA: Diagnosis not present

## 2019-09-13 DIAGNOSIS — K589 Irritable bowel syndrome without diarrhea: Secondary | ICD-10-CM | POA: Diagnosis not present

## 2019-09-13 MED ORDER — PRAVASTATIN SODIUM 20 MG PO TABS: 20.0000 mg | ORAL_TABLET | ORAL | 3 refills | Status: DC

## 2019-09-13 MED ORDER — TRETINOIN 0.025 % EX CREA: TOPICAL_CREAM | Freq: Every day | CUTANEOUS | 1 refills | Status: DC

## 2019-09-13 NOTE — Patient Instructions (Addendum)
Ms. Pensyl , Thank you for taking time to come for your Medicare Wellness Visit. I appreciate your ongoing commitment to your health goals. Please review the following plan we discussed and let me know if I can assist you in the future.   These are the goals we discussed: Goals     DIET - INCREASE WATER INTAKE     65-80+ fluid ounces of clear sugar free liquid     Weight (lb) < 180 lb (81.6 kg)       This is a list of the screening recommended for you and due dates:  Health Maintenance  Topic Date Due   Pneumonia vaccines (2 of 2 - PPSV23) 01/26/2019   Flu Shot  11/04/2019   Mammogram  01/30/2021   Colon Cancer Screening  11/04/2022   Tetanus Vaccine  10/26/2026   DEXA scan (bone density measurement)  Completed   COVID-19 Vaccine  Completed    Hepatitis C: One time screening is recommended by Center for Disease Control  (CDC) for  adults born from [birth year] through 1963.   Completed     Ask insurance about shingrix vaccine coverage - can get at [facility]  Stop fosamax around Oct 2021    Pravastatin tablets What is this medicine? PRAVASTATIN (PRA va stat in) is known as a HMG-CoA reductase inhibitor or 'statin'. It lowers the level of cholesterol and triglycerides in the blood. This drug may also reduce the risk of heart attack, stroke, or other health problems in patients with risk factors for heart disease. Diet and lifestyle changes are often used with this drug. This medicine may be used for other purposes; ask your health care provider or pharmacist if you have questions. COMMON BRAND NAME(S): Pravachol What should I tell my health care provider before I take this medicine? They need to know if you have any of these conditions:  diabetes  if you often drink alcohol  history of stroke  kidney disease  liver disease  muscle aches or weakness  thyroid disease  an unusual or allergic reaction to pravastatin, other medicines, foods, dyes, or  preservatives  pregnant or trying to get pregnant  breast-feeding How should I use this medicine? Take pravastatin tablets by mouth. Swallow the tablets with a drink of water. Pravastatin can be taken at anytime of the day, with or without food. Follow the directions on the prescription label. Take your doses at regular intervals. Do not take your medicine more often than directed. Talk to your pediatrician regarding the use of this medicine in children. Special care may be needed. Pravastatin has been used in children as young as 56 years of age. Overdosage: If you think you have taken too much of this medicine contact a poison control center or emergency room at once. NOTE: This medicine is only for you. Do not share this medicine with others. What if I miss a dose? If you miss a dose, take it as soon as you can. If it is almost time for your next dose, take only that dose. Do not take double or extra doses. What may interact with this medicine? This medicine may interact with the following medications:  colchicine  cyclosporine  other medicines for high cholesterol  some antibiotics like azithromycin, clarithromycin, erythromycin, and telithromycin This list may not describe all possible interactions. Give your health care provider a list of all the medicines, herbs, non-prescription drugs, or dietary supplements you use. Also tell them if you smoke, drink alcohol, or use  illegal drugs. Some items may interact with your medicine. What should I watch for while using this medicine? Visit your doctor or health care professional for regular check-ups. You may need regular tests to make sure your liver is working properly. Your health care professional may tell you to stop taking this medicine if you develop muscle problems. If your muscle problems do not go away after stopping this medicine, contact your health care professional. Do not become pregnant while taking this medicine. Women should  inform their health care professional if they wish to become pregnant or think they might be pregnant. There is a potential for serious side effects to an unborn child. Talk to your health care professional or pharmacist for more information. Do not breast-feed an infant while taking this medicine. This medicine may affect blood sugar levels. If you have diabetes, check with your doctor or health care professional before you change your diet or the dose of your diabetic medicine. If you are going to need surgery or other procedure, tell your doctor that you are using this medicine. This drug is only part of a total heart-health program. Your doctor or a dietician can suggest a low-cholesterol and low-fat diet to help. Avoid alcohol and smoking, and keep a proper exercise schedule. This medicine may cause a decrease in Co-Enzyme Q-10. You should make sure that you get enough Co-Enzyme Q-10 while you are taking this medicine. Discuss the foods you eat and the vitamins you take with your health care professional. What side effects may I notice from receiving this medicine? Side effects that you should report to your doctor or health care professional as soon as possible:  allergic reactions like skin rash, itching or hives, swelling of the face, lips, or tongue  dark urine  fever  muscle pain, cramps, or weakness  redness, blistering, peeling or loosening of the skin, including inside the mouth  trouble passing urine or change in the amount of urine  unusually weak or tired  yellowing of the eyes or skin Side effects that usually do not require medical attention (report to your doctor or health care professional if they continue or are bothersome):  gas  headache  heartburn  indigestion  stomach pain This list may not describe all possible side effects. Call your doctor for medical advice about side effects. You may report side effects to FDA at 1-800-FDA-1088. Where should I keep my  medicine? Keep out of the reach of children. Store at room temperature between 15 to 30 degrees C (59 to 86 degrees F). Protect from light. Keep container tightly closed. Throw away any unused medicine after the expiration date. NOTE: This sheet is a summary. It may not cover all possible information. If you have questions about this medicine, talk to your doctor, pharmacist, or health care provider.  2020 Elsevier/Gold Standard (2016-11-23 12:37:09)

## 2019-09-14 LAB — COMPLETE METABOLIC PANEL WITH GFR
AG Ratio: 1.4 (calc) (ref 1.0–2.5)
ALT: 12 U/L (ref 6–29)
AST: 15 U/L (ref 10–35)
Albumin: 4 g/dL (ref 3.6–5.1)
Alkaline phosphatase (APISO): 37 U/L (ref 37–153)
BUN: 13 mg/dL (ref 7–25)
CO2: 28 mmol/L (ref 20–32)
Calcium: 9.4 mg/dL (ref 8.6–10.4)
Chloride: 101 mmol/L (ref 98–110)
Creat: 0.66 mg/dL (ref 0.50–0.99)
GFR, Est African American: 107 mL/min/{1.73_m2} (ref >=60)
GFR, Est Non African American: 92 mL/min/{1.73_m2} (ref >=60)
Globulin: 2.9 g/dL (calc) (ref 1.9–3.7)
Glucose, Bld: 104 mg/dL — ABNORMAL HIGH (ref 65–99)
Potassium: 4.2 mmol/L (ref 3.5–5.3)
Sodium: 136 mmol/L (ref 135–146)
Total Bilirubin: 0.6 mg/dL (ref 0.2–1.2)
Total Protein: 6.9 g/dL (ref 6.1–8.1)

## 2019-09-14 LAB — CBC WITH DIFFERENTIAL/PLATELET
Absolute Monocytes: 657 cells/uL (ref 200–950)
Basophils Absolute: 39 cells/uL (ref 0–200)
Basophils Relative: 0.6 %
Eosinophils Absolute: 130 cells/uL (ref 15–500)
Eosinophils Relative: 2 %
HCT: 39.6 % (ref 35.0–45.0)
Hemoglobin: 13.1 g/dL (ref 11.7–15.5)
Lymphs Abs: 1534 cells/uL (ref 850–3900)
MCH: 30.7 pg (ref 27.0–33.0)
MCHC: 33.1 g/dL (ref 32.0–36.0)
MCV: 92.7 fL (ref 80.0–100.0)
MPV: 11.7 fL (ref 7.5–12.5)
Monocytes Relative: 10.1 %
Neutro Abs: 4141 cells/uL (ref 1500–7800)
Neutrophils Relative %: 63.7 %
Platelets: 275 10*3/uL (ref 140–400)
RBC: 4.27 10*6/uL (ref 3.80–5.10)
RDW: 11.6 % (ref 11.0–15.0)
Total Lymphocyte: 23.6 %
WBC: 6.5 10*3/uL (ref 3.8–10.8)

## 2019-09-14 LAB — TSH: TSH: 0.96 mIU/L (ref 0.40–4.50)

## 2019-09-14 LAB — LIPID PANEL
Cholesterol: 150 mg/dL (ref <200)
HDL: 63 mg/dL (ref >=50)
LDL Cholesterol (Calc): 63 mg/dL (calc)
Non-HDL Cholesterol (Calc): 87 mg/dL (calc) (ref <130)
Total CHOL/HDL Ratio: 2.4 (calc) (ref <5.0)
Triglycerides: 162 mg/dL — ABNORMAL HIGH (ref <150)

## 2019-09-14 LAB — MAGNESIUM: Magnesium: 2.2 mg/dL (ref 1.5–2.5)

## 2019-09-28 ENCOUNTER — Other Ambulatory Visit: Payer: Self-pay | Admitting: Adult Health

## 2019-09-28 MED ORDER — HYDROQUINONE 4 % EX CREA
TOPICAL_CREAM | Freq: Two times a day (BID) | CUTANEOUS | 0 refills | Status: DC
Start: 2019-09-28 — End: 2019-10-01

## 2019-10-01 ENCOUNTER — Other Ambulatory Visit: Payer: Self-pay | Admitting: Adult Health

## 2019-10-01 MED ORDER — HYDROQUINONE 4 % EX CREA: TOPICAL_CREAM | Freq: Two times a day (BID) | CUTANEOUS | 0 refills | Status: DC

## 2019-10-21 ENCOUNTER — Other Ambulatory Visit: Payer: Self-pay | Admitting: Cardiovascular Disease

## 2019-10-22 NOTE — Telephone Encounter (Signed)
Pt last saw Dr [name] 01/17/19, last labs 09/13/19 Creat 0.66, age 69, weight 96kg, based on specified criteria pt is on appropriate dosage of Eliquis 5mg  BID.  Will refill rx.

## 2019-10-25 ENCOUNTER — Other Ambulatory Visit: Payer: Self-pay | Admitting: Cardiovascular Disease

## 2019-10-29 DIAGNOSIS — H47323 Drusen of optic disc, bilateral: Secondary | ICD-10-CM | POA: Diagnosis not present

## 2019-11-16 ENCOUNTER — Other Ambulatory Visit: Payer: Self-pay | Admitting: Adult Health

## 2019-11-16 MED ORDER — HYDROQUINONE 4 % EX CREA: TOPICAL_CREAM | Freq: Two times a day (BID) | CUTANEOUS | 1 refills | Status: DC

## 2019-12-14 DIAGNOSIS — Z23 Encounter for immunization: Secondary | ICD-10-CM | POA: Diagnosis not present

## 2019-12-17 ENCOUNTER — Encounter: Payer: Self-pay | Admitting: Adult Health

## 2019-12-17 DIAGNOSIS — L811 Chloasma: Secondary | ICD-10-CM | POA: Insufficient documentation

## 2019-12-17 NOTE — Progress Notes (Signed)
FOLLOW UP  Assessment and Plan:   Atherosclerosis of aorta Per CT 04/2019 Control blood pressure, cholesterol, glucose, increase exercise.   Thoracic aortic aneurysm (HCC) Control BP; monitored annually by cardiology; follow  Paroxysmal atrial fibrillation ([place]) Continue meds, continue cardio follow up, no symptoms at this time.  Rate controlled, continue elequis.   Hypertension Well controlled with current medications  Monitor blood pressure at home; patient to call if consistently greater than 130/80 Continue DASH diet.   Reminder to go to the ER if any CP, SOB, nausea, dizziness, severe HA, changes vision/speech, left arm numbness and tingling and jaw pain.  Cholesterol Currently at LDL goal without medication; diet for trigs discussed, suggested omega 3 Continue low cholesterol diet and exercise.  Check lipid panel.   Hx of prediabetes Recent A1Cs at goal  Continue diet and exercise.  Perform daily foot/skin check, notify office of any concerning changes.  Defer A1C; monitor weight and serum glucose   Obesity with co morbidities Long discussion about weight loss, diet, and exercise Recommended diet heavy in fruits and veggies and low in animal meats, cheeses, and dairy products, appropriate calorie intake Discussed ideal weight for height  Patient will work on continue [employer], watch portions, increase exercise Will follow up in 3 months  Vitamin D Def At goal at last visit; continue supplementation to maintain goal of 60-100 Defer Vit D level  Insomnia good sleep hygiene discussed, increase day time activity Doing well with benadryl  OAB Given trial of myrbetric; follow up with progress in 2 weeks If cost prohibitive consider oxybutynin   Melasma Has completed 8 weeks hydroquinone with sig improvement; stop for now for 8 weeks May repeat hydroquinone for 8 more weeks if needed after 2-3 months of break then STOP; discussed risk of  ochronosis Continue retin A; tolerating well - increased to 0.05% Suggested niacinamide 5% BID and azelaic 10% daily AM; Sunscreen daily broad spectrum SPF 30+  Continue diet and meds as discussed. Further disposition pending results of labs. Discussed med's effects and SE's.   Over 30 minutes of exam, counseling, chart review, and critical decision making was performed.   Future Appointments  Date Time Provider [place]  01/22/2020 10:15 AM Josue Hector, MD [facility]  03/26/2020  3:00 PM Unk Pinto, MD [facility] None  09/25/2020 10:00 AM Vicie Mutters, PA-C [facility] None    ----------------------------------------------------------------------------------------------------------------------  HPI 69 y.o. female  presents for 3 month follow up on hypertension, cholesterol, hx of prediabetes, obesity and vitamin D deficiency.   She takes benadryl at night for sleep; had been prescribed xanax for anxiety but was able to taper off due to rare use.  She is concerned about persistent urinary frequency, has to go every 1-1.5 hours, has seen GYN without prolapse, normal urine. Myrbetriq discussed -   She has melasma, started tretinoin/hydroquinone topical therapy, has been doing retin A 0.025 mg daily, hydroquinone 4% for 8 weeks, with good results, tolerating well, does wear sunscreen daily.   BMI is Body mass index is 34.17 kg/m., she has been working on diet, admits gained weight over holiday, back on weight watchers, down from peak weight of 254 lb. Goal is <200 lb Walks some but limited by advanced knee arthritis (follows with Dr. Norm Salt, getting gel injections). Tries to be active in general.  Wt Readings from Last 3 Encounters:  12/18/19 218 lb 3.2 oz (99 kg)  09/13/19 211 lb 9.6 oz (96 kg)  06/06/19 207 lb 9.6 oz (  94.2 kg)   She has been following with [facility] Dr. [name], has Afib, controlled SVT, Thoracic aortic anuerysm, on  elliquis.  Has CTA annually; most recent in 04/2019 showed aneurysm 3.8 cm; also shows aortic atherosclerosis.   Her blood pressure has been controlled at home, today their BP is BP: 120/72  She does not workout. She denies chest pain, shortness of breath, dizziness.   She is on cholesterol medication (on pravastatin 20 mg every other day). Her LDL cholesterol is at goal. Trigs mildly elevated, taking flax seed oil. The cholesterol last visit was:   Lab Results  Component Value Date   CHOL 150 09/13/2019   HDL 63 09/13/2019   LDLCALC 63 09/13/2019   TRIG 162 (H) 09/13/2019   CHOLHDL 2.4 09/13/2019    She has been working on diet and exercise for hx of prediabetes (A1C 5.8% in 2016 and 2017), and denies foot ulcerations, increased appetite, nausea, paresthesia of the feet, polydipsia, polyuria, visual disturbances, vomiting and weight loss. Last A1C in the office was:  Lab Results  Component Value Date   HGBA1C 5.3 02/27/2019    Last GFR:  Lab Results  Component Value Date   GFRNONAA 92 09/13/2019   Patient is on Vitamin D supplement.   Lab Results  Component Value Date   VD25OH 76 02/27/2019        Current Medications:  Current Outpatient Medications on File Prior to Visit  Medication Sig  . Ascorbic Acid (VITAMIN C PO) Take by mouth 2 (two) times daily.  Marland Kitchen atenolol (TENORMIN) 50 MG tablet TAKE ONE-HALF (1/2) TABLET (25 MG) DAILY, MAY TAKE EXTRA ONE-HALF (1/2) TABLET AS NEEDED FOR PALPITATIONS  . Cholecalciferol (VITAMIN D-3 PO) Take 8,000 Units by mouth daily.   Marland Kitchen CINNAMON PO Take 1,000 mg by mouth daily.   . diclofenac sodium (VOLTAREN) 1 % GEL Apply 4 g topically 4 (four) times daily.  . DiphenhydrAMINE HCl (BENADRYL ALLERGY PO) Take by mouth as needed (allergies).   Marland Kitchen ELIQUIS 5 MG TABS tablet TAKE 1 TABLET TWICE A DAY  . hydrochlorothiazide (HYDRODIURIL) 25 MG tablet Take 1 tablet Daily for BP & Fluid Retention / Ankle swelling  . loperamide (IMODIUM) 2 MG capsule Take 4  mg by mouth daily.   . pravastatin (PRAVACHOL) 20 MG tablet Take 1 tablet (20 mg total) by mouth every other day. Take in the evening for cholesterol and plaque.  Marland Kitchen PREMPRO 0.625-2.5 MG tablet TAKE AS DIRECTED  . quinapril (ACCUPRIL) 40 MG tablet Take 1 tablet Daily for BP  . vitamin B-12 (CYANOCOBALAMIN) 1000 MCG tablet Take 1,000 mcg by mouth daily.  . [DISCONTINUED] Hyoscyamine Sulfate SL (LEVSIN/SL) 0.125 MG SUBL Place 0.125 mg under the tongue every 4 (four) hours as needed (diarrhea).   No current facility-administered medications on file prior to visit.     Allergies:  Allergies  Allergen Reactions  . Ciprofloxacin   . Levaquin [Levofloxacin In D5w]     No fluroquinolones due to ascending thoracic aortic aneurysm  . Penicillins Other (See Comments)    Had as a child=unknown reaction  . Zoloft [Sertraline Hcl]      Medical History:  Past Medical History:  Diagnosis Date  . Achilles tendonitis    R heal, limits activity  . Allergy   . Anxiety   . Elevated hemoglobin A1c   . Hyperlipidemia   . Hypertension   . IBS (irritable bowel syndrome)   . Insomnia   . Obesity   .  Palpitations   . SVT (supraventricular tachycardia) ([place]) 10/29/2013  . Vitamin D deficiency    Family history- Reviewed and unchanged Social history- Reviewed and unchanged   Review of Systems:  Review of Systems  Constitutional: Negative for malaise/fatigue and weight loss.  HENT: Negative for hearing loss and tinnitus.   Eyes: Negative for blurred vision and double vision.  Respiratory: Negative for cough, shortness of breath and wheezing.   Cardiovascular: Positive for palpitations (Intermittent, can feel a. fib). Negative for chest pain, orthopnea, claudication and leg swelling.  Gastrointestinal: Negative for abdominal pain, blood in stool, constipation, diarrhea, heartburn, melena, nausea and vomiting.  Genitourinary: Positive for frequency (persistent, some urge incontinence). Negative for  dysuria, flank pain, hematuria and urgency.  Musculoskeletal: Negative for joint pain and myalgias.  Skin: Negative for rash.  Neurological: Negative for dizziness, tingling, sensory change, weakness and headaches.  Endo/Heme/Allergies: Negative for polydipsia.  Psychiatric/Behavioral: Negative.   All other systems reviewed and are negative.     Physical Exam: BP 120/72   Pulse (!) 56   Temp 97.7 F (36.5 C)   Wt 218 lb 3.2 oz (99 kg)   SpO2 98%   BMI 34.17 kg/m  Wt Readings from Last 3 Encounters:  12/18/19 218 lb 3.2 oz (99 kg)  09/13/19 211 lb 9.6 oz (96 kg)  06/06/19 207 lb 9.6 oz (94.2 kg)   General Appearance: Well nourished, in no apparent distress. Eyes: PERRLA, EOMs, conjunctiva no swelling or erythema Sinuses: No Frontal/maxillary tenderness ENT/Mouth: Ext aud canals clear, TMs without erythema, bulging. No erythema, swelling, or exudate on post pharynx.  Tonsils not swollen or erythematous. Hearing normal.  Neck: Supple, thyroid normal.  Respiratory: Respiratory effort normal, BS equal bilaterally without rales, rhonchi, wheezing or stridor.  Cardio: RRR with no MRGs. Brisk peripheral pulses without edema.  Abdomen: Soft, + BS.  Non tender, no guarding, rebound, hernias, masses. Lymphatics: Non tender without lymphadenopathy.  Musculoskeletal: Full ROM, 5/5 strength, Normal gait Skin: Warm, dry without rashes, lesions, ecchymosis. She has improved melasma, now mild only to bil cheeks and chin Neuro: Cranial nerves intact. No cerebellar symptoms.  Psych: Awake and oriented X 3, normal affect, Insight and Judgment appropriate.    Izora Ribas, NP 11:41 AM [facility]

## 2019-12-18 ENCOUNTER — Other Ambulatory Visit: Payer: Self-pay

## 2019-12-18 ENCOUNTER — Encounter: Payer: Self-pay | Admitting: Adult Health

## 2019-12-18 ENCOUNTER — Ambulatory Visit (INDEPENDENT_AMBULATORY_CARE_PROVIDER_SITE_OTHER): Payer: Medicare Other | Admitting: Adult Health

## 2019-12-18 VITALS — BP 120/72 | HR 56 | Temp 97.7°F | Wt 218.2 lb

## 2019-12-18 DIAGNOSIS — N3281 Overactive bladder: Secondary | ICD-10-CM

## 2019-12-18 DIAGNOSIS — E669 Obesity, unspecified: Secondary | ICD-10-CM | POA: Diagnosis not present

## 2019-12-18 DIAGNOSIS — E559 Vitamin D deficiency, unspecified: Secondary | ICD-10-CM

## 2019-12-18 DIAGNOSIS — I10 Essential (primary) hypertension: Secondary | ICD-10-CM

## 2019-12-18 DIAGNOSIS — R7309 Other abnormal glucose: Secondary | ICD-10-CM

## 2019-12-18 DIAGNOSIS — G4700 Insomnia, unspecified: Secondary | ICD-10-CM | POA: Diagnosis not present

## 2019-12-18 DIAGNOSIS — I480 Paroxysmal atrial fibrillation: Secondary | ICD-10-CM

## 2019-12-18 DIAGNOSIS — E782 Mixed hyperlipidemia: Secondary | ICD-10-CM | POA: Diagnosis not present

## 2019-12-18 DIAGNOSIS — L811 Chloasma: Secondary | ICD-10-CM

## 2019-12-18 DIAGNOSIS — I700 Atherosclerosis of aorta: Secondary | ICD-10-CM | POA: Diagnosis not present

## 2019-12-18 DIAGNOSIS — Z79899 Other long term (current) drug therapy: Secondary | ICD-10-CM

## 2019-12-18 MED ORDER — MIRABEGRON ER 50 MG PO TB24: 50.0000 mg | ORAL_TABLET | Freq: Every day | ORAL | 0 refills | Status: DC

## 2019-12-18 MED ORDER — TRETINOIN 0.05 % EX CREA: TOPICAL_CREAM | Freq: Every day | CUTANEOUS | 1 refills | Status: DC

## 2019-12-18 MED ORDER — HYDROQUINONE 4 % EX CREA: TOPICAL_CREAM | Freq: Two times a day (BID) | CUTANEOUS | 0 refills | Status: DC

## 2019-12-18 NOTE — Patient Instructions (Addendum)
Stop hydroquinone for 8 weeks, then restart for 1 more tube then STOP (long term can cause permanent discoloration)  Instead, can do:   Niacinamide - 5% topical - cerave moisturizers all contain this - twice a day ok   Azelaic acid - naturitum 10% emulsion - in the morning before moisturizer  Make sure suncreen is broad spectrum and 30+, wear DAILY - apply to neck, chest, back of hands as well     Mirabegron extended-release tablets What is this medicine? MIRABEGRON (MIR a BEG ron) is used to treat overactive bladder. This medicine reduces the amount of bathroom visits. It may also help to control wetting accidents. It may be used alone, but sometimes may be given with other treatments. This medicine may be used for other purposes; ask your health care provider or pharmacist if you have questions. COMMON BRAND NAME(S): Myrbetriq What should I tell my health care provider before I take this medicine? They need to know if you have any of these conditions:  high blood pressure  kidney disease  liver disease  problems urinating  prostate disease  an unusual or allergic reaction to mirabegron, other medicines, foods, dyes, or preservatives  pregnant or trying to get pregnant  breast-feeding How should I use this medicine? Take this medicine by mouth with a glass of water. Follow the directions on the prescription label. Do not cut, crush or chew this medicine. You can take it with or without food. If it upsets your stomach, take it with food. Take your medicine at regular intervals. Do not take it more often than directed. Do not stop taking except on your doctor's advice. Talk to your pediatrician regarding the use of this medicine in children. Special care may be needed. Overdosage: If you think you have taken too much of this medicine contact a poison control center or emergency room at once. NOTE: This medicine is only for you. Do not share this medicine with others. What if  I miss a dose? If you miss a dose, take it as soon as you can. If it is almost time for your next dose, take only that dose. Do not take double or extra doses. What may interact with this medicine?  codeine  desipramine  digoxin  flecainide  MAOIs like Carbex, Eldepryl, Marplan, Nardil, and Parnate  methadone  metoprolol  pimozide  propafenone  thioridazine  warfarin This list may not describe all possible interactions. Give your health care provider a list of all the medicines, herbs, non-prescription drugs, or dietary supplements you use. Also tell them if you smoke, drink alcohol, or use illegal drugs. Some items may interact with your medicine. What should I watch for while using this medicine? Visit your doctor or health care professional for regular checks on your progress. Check your blood pressure as directed. Ask your doctor or health care professional what your blood pressure should be and when you should contact him or her. You may need to limit your intake of tea, coffee, caffeinated sodas, or alcohol. These drinks may make your symptoms worse. What side effects may I notice from receiving this medicine? Side effects that you should report to your doctor or health care professional as soon as possible:  allergic reactions like skin rash, itching or hives, swelling of the face, lips, or tongue  high blood pressure  fast, irregular heartbeat  redness, blistering, peeling or loosening of the skin, including inside the mouth  signs of infection like fever or chills; pain or  difficulty passing urine  trouble passing urine or change in the amount of urine Side effects that usually do not require medical attention (report to your doctor or health care professional if they continue or are bothersome):  constipation  dry mouth  headache  runny nose  stomach upset This list may not describe all possible side effects. Call your doctor for medical advice about  side effects. You may report side effects to FDA at 1-800-FDA-1088. Where should I keep my medicine? Keep out of the reach of children. Store at room temperature between 15 and 30 degrees C (59 and 86 degrees F). Throw away any unused medicine after the expiration date. NOTE: This sheet is a summary. It may not cover all possible information. If you have questions about this medicine, talk to your doctor, pharmacist, or health care provider.  2020 Elsevier/Gold Standard (2016-08-12 11:33:21)

## 2019-12-19 LAB — CBC WITH DIFFERENTIAL/PLATELET
Absolute Monocytes: 676 cells/uL (ref 200–950)
Basophils Absolute: 62 cells/uL (ref 0–200)
Basophils Relative: 1.2 %
Eosinophils Absolute: 213 cells/uL (ref 15–500)
Eosinophils Relative: 4.1 %
HCT: 39.1 % (ref 35.0–45.0)
Hemoglobin: 12.9 g/dL (ref 11.7–15.5)
Lymphs Abs: 1524 cells/uL (ref 850–3900)
MCH: 30.2 pg (ref 27.0–33.0)
MCHC: 33 g/dL (ref 32.0–36.0)
MCV: 91.6 fL (ref 80.0–100.0)
MPV: 11.4 fL (ref 7.5–12.5)
Monocytes Relative: 13 %
Neutro Abs: 2725 cells/uL (ref 1500–7800)
Neutrophils Relative %: 52.4 %
Platelets: 257 10*3/uL (ref 140–400)
RBC: 4.27 10*6/uL (ref 3.80–5.10)
RDW: 11.9 % (ref 11.0–15.0)
Total Lymphocyte: 29.3 %
WBC: 5.2 10*3/uL (ref 3.8–10.8)

## 2019-12-19 LAB — COMPLETE METABOLIC PANEL WITH GFR
AG Ratio: 1.3 (calc) (ref 1.0–2.5)
ALT: 18 U/L (ref 6–29)
AST: 19 U/L (ref 10–35)
Albumin: 4 g/dL (ref 3.6–5.1)
Alkaline phosphatase (APISO): 42 U/L (ref 37–153)
BUN: 12 mg/dL (ref 7–25)
CO2: 28 mmol/L (ref 20–32)
Calcium: 9.1 mg/dL (ref 8.6–10.4)
Chloride: 100 mmol/L (ref 98–110)
Creat: 0.74 mg/dL (ref 0.50–0.99)
GFR, Est African American: 97 mL/min/{1.73_m2} (ref >=60)
GFR, Est Non African American: 84 mL/min/{1.73_m2} (ref >=60)
Globulin: 3 g/dL (calc) (ref 1.9–3.7)
Glucose, Bld: 91 mg/dL (ref 65–99)
Potassium: 4.6 mmol/L (ref 3.5–5.3)
Sodium: 136 mmol/L (ref 135–146)
Total Bilirubin: 0.5 mg/dL (ref 0.2–1.2)
Total Protein: 7 g/dL (ref 6.1–8.1)

## 2019-12-19 LAB — LIPID PANEL
Cholesterol: 136 mg/dL (ref <200)
HDL: 55 mg/dL (ref >=50)
LDL Cholesterol (Calc): 56 mg/dL (calc)
Non-HDL Cholesterol (Calc): 81 mg/dL (calc) (ref <130)
Total CHOL/HDL Ratio: 2.5 (calc) (ref <5.0)
Triglycerides: 170 mg/dL — ABNORMAL HIGH (ref <150)

## 2019-12-19 LAB — TSH: TSH: 1.02 mIU/L (ref 0.40–4.50)

## 2019-12-19 LAB — MAGNESIUM: Magnesium: 2.1 mg/dL (ref 1.5–2.5)

## 2019-12-26 ENCOUNTER — Other Ambulatory Visit: Payer: Self-pay | Admitting: Adult Health

## 2019-12-26 MED ORDER — MIRABEGRON ER 50 MG PO TB24: 50.0000 mg | ORAL_TABLET | Freq: Every day | ORAL | 0 refills | Status: DC

## 2019-12-28 ENCOUNTER — Other Ambulatory Visit: Payer: Self-pay | Admitting: Adult Health

## 2019-12-28 MED ORDER — MIRABEGRON ER 50 MG PO TB24: 50.0000 mg | ORAL_TABLET | Freq: Every day | ORAL | 0 refills | Status: DC

## 2019-12-31 ENCOUNTER — Other Ambulatory Visit: Payer: Self-pay | Admitting: Adult Health

## 2019-12-31 ENCOUNTER — Other Ambulatory Visit: Payer: Self-pay | Admitting: Internal Medicine

## 2019-12-31 MED ORDER — MIRABEGRON ER 50 MG PO TB24: 50.0000 mg | ORAL_TABLET | Freq: Every day | ORAL | 0 refills | Status: DC

## 2020-01-09 NOTE — Progress Notes (Signed)
CARDIOLOGY OFFICE NOTE  Date:  01/22/2020    [name] Date of Birth: [birth year] Medical Record #[id number]  PCP:  Unk Pinto, MD  Cardiologist:  [name]  No chief complaint on file.   History of Present Illness:  69 y.o. with HTN, PAF, DM likely OSA but no sleep study done. She has CHA2VASC score 3 and is maintained on eliquis. Tends toward bradycardia and beta blocker dose has been decreased   Event monitor benign PAC;s 10/26/16 Myovue normal EF 68% 10/19/16   Stable mild aortic root dilatation by CT 04/24/19 3.9 cm   Primary did lots blood work including TSH which was normal   IBS bothering her more   Lost close to 40 lbs with weight watchers has bad knees but activity better Told her not to take Tumeric with her eliquis   She is fully vaccinated Given flu shot in our office today   Past Medical History:  Diagnosis Date  . Achilles tendonitis    R heal, limits activity  . Allergy   . Anxiety   . Elevated hemoglobin A1c   . Hyperlipidemia   . Hypertension   . IBS (irritable bowel syndrome)   . Insomnia   . Obesity   . Palpitations   . SVT (supraventricular tachycardia) ([place]) 10/29/2013  . Vitamin D deficiency     Past Surgical History:  Procedure Laterality Date  . CESAREAN SECTION    . CESAREAN SECTION    . CHOLECYSTECTOMY  10 years ago  . TUBAL LIGATION Bilateral      Medications: Current Meds  Medication Sig  . Ascorbic Acid (VITAMIN C PO) Take by mouth 2 (two) times daily.  Marland Kitchen atenolol (TENORMIN) 50 MG tablet TAKE ONE-HALF (1/2) TABLET (25 MG) DAILY, MAY TAKE EXTRA ONE-HALF (1/2) TABLET AS NEEDED FOR PALPITATIONS  . Cholecalciferol (VITAMIN D-3 PO) Take 8,000 Units by mouth daily.   Marland Kitchen CINNAMON PO Take 1,000 mg by mouth daily.   . diclofenac Sodium (VOLTAREN) 1 % GEL Apply 4 g topically 4 (four) times daily as needed.  . DiphenhydrAMINE HCl (BENADRYL ALLERGY PO) Take by mouth as needed (allergies).   Marland Kitchen ELIQUIS 5 MG TABS tablet TAKE  1 TABLET TWICE A DAY  . hydrochlorothiazide (HYDRODIURIL) 25 MG tablet Take 1 tablet Daily for BP & Fluid Retention / Ankle swelling  . loperamide (IMODIUM) 2 MG capsule Take 4 mg by mouth daily.   . pravastatin (PRAVACHOL) 20 MG tablet Take 1 tablet (20 mg total) by mouth every other day. Take in the evening for cholesterol and plaque.  . quinapril (ACCUPRIL) 40 MG tablet Take 1 tablet Daily for BP  . tretinoin (RETIN-A) 0.05 % cream Apply topically at bedtime.  . vitamin B-12 (CYANOCOBALAMIN) 1000 MCG tablet Take 1,000 mcg by mouth daily.     Allergies: Allergies  Allergen Reactions  . Ciprofloxacin   . Levaquin [Levofloxacin In D5w]     No fluroquinolones due to ascending thoracic aortic aneurysm  . Penicillins Other (See Comments)    Had as a child=unknown reaction  . Zoloft [Sertraline Hcl]     Social History: The patient  reports that she quit smoking about 15 years ago. She has never used smokeless tobacco. She reports current alcohol use. She reports that she does not use drugs.   Family History: The patient's family history includes Alcohol abuse in her father; Diabetes in her mother; Heart attack in her father and mother; Heart disease in her mother; Macular degeneration  in her mother; Ovarian cancer in her maternal grandmother; Stroke in her mother.   Review of Systems: Please see the history of present illness.   Otherwise, the review of systems is positive for none.   All other systems are reviewed and negative.   Physical Exam: VS:  BP 130/76   Pulse (!) 49   Ht 5\' 8"  (1.727 m)   Wt 215 lb 6.4 oz (97.7 kg)   SpO2 99%   BMI 32.75 kg/m  .  BMI Body mass index is 32.75 kg/m.  Wt Readings from Last 3 Encounters:  01/22/20 215 lb 6.4 oz (97.7 kg)  12/18/19 218 lb 3.2 oz (99 kg)  09/13/19 211 lb 9.6 oz (96 kg)   Affect appropriate Healthy:  appears stated age 60: normal Neck supple with no adenopathy JVP normal no bruits no thyromegaly Lungs clear with no  wheezing and good diaphragmatic motion Heart:  S1/S2 no murmur, no rub, gallop or click PMI normal Abdomen: benighn, BS positve, no tenderness, no AAA no bruit.  No HSM or HJR Distal pulses intact with no bruits No edema Neuro non-focal Skin warm and dry No muscular weakness    LABORATORY DATA:  EKG:  10/12/16  This demonstrates NSR.  01/22/20 SR rate 49 normal   Lab Results  Component Value Date   WBC 5.2 12/18/2019   HGB 12.9 12/18/2019   HCT 39.1 12/18/2019   PLT 257 12/18/2019   GLUCOSE 91 12/18/2019   CHOL 136 12/18/2019   TRIG 170 (H) 12/18/2019   HDL 55 12/18/2019   LDLCALC 56 12/18/2019   ALT 18 12/18/2019   AST 19 12/18/2019   NA 136 12/18/2019   K 4.6 12/18/2019   CL 100 12/18/2019   CREATININE 0.74 12/18/2019   BUN 12 12/18/2019   CO2 28 12/18/2019   TSH 1.02 12/18/2019   HGBA1C 5.3 02/27/2019   MICROALBUR <0.2 02/27/2019     BNP (last 3 results) No results for input(s): BNP in the last 8760 hours.  ProBNP (last 3 results) No results for input(s): PROBNP in the last 8760 hours.   Other Studies Reviewed Today:  Echo Study Conclusions from 2015  - Left ventricle: The cavity size was normal. Wall thickness was increased in a pattern of mild LVH. Systolic function was normal. The estimated ejection fraction was in the range of 60% to 65%. Wall motion was normal; there were no regional wall motion abnormalities. Features are consistent with a pseudonormal left ventricular filling pattern, with concomitant abnormal relaxation and increased filling pressure (grade 2 diastolic dysfunction). - Aortic valve: There was no stenosis. - Aorta: Ascending aortic diameter: 27mm (S). - Ascending aorta: The ascending aorta was mildly dilated. - Mitral valve: No significant regurgitation. - Left atrium: The atrium was mildly dilated. - Right ventricle: The cavity size was normal. Systolic function was normal. - Right atrium: The atrium was  mildly dilated. - Tricuspid valve: Peak RV-RA gradient: 62mm Hg (S). - Pulmonary arteries: PA peak pressure: 58mm Hg (S). - Inferior vena cava: The vessel was normal in size; the respirophasic diameter changes were in the normal range (= 50%); findings are consistent with normal central venous pressure. Impressions:  - Normal LV size and systolic function, EF 61-95%. Mild LV hypertrophy. Moderate diastolic dysfunction. Normal RV size and systolic function. No significant valvular abnormalities. Mildly dilated ascending aorta.   CTA CHEST MPRESSION   04/24/19:  Stable aorta root 3.9 cm orthogonal planes   Assessment/Plan:  1. IBS:  Continue Bentyl f/u GI   2. Palpitations most recent event monitor 10/26/16 only benign PAC;s   3. Obesity - encouraged weight loss in the past - she does not exercise. Explained this is a risk factor for her AF.   4. History of bradycardia - stable on 1/2 dose atenolol. She does use extra prn.   5. Snoring - She has declinedsleep study in the past. Declined again today despite this being a risk factor for AF.   6. Dilated aorta - most recent CT 04/24/19 only 3.9 cm   7. PAF: CHADVASC 3 no recurrence since 2015 on eliquis   8. HLD:  On statin LDL 56 f/u primary   9. HTN:  Well controlled.  Continue current medications and low sodium Dash type diet.    F/U with cardiology in a year      Jenkins Rouge

## 2020-01-10 ENCOUNTER — Encounter: Payer: Self-pay | Admitting: Internal Medicine

## 2020-01-22 ENCOUNTER — Encounter: Payer: Self-pay | Admitting: Cardiovascular Disease

## 2020-01-22 ENCOUNTER — Ambulatory Visit (INDEPENDENT_AMBULATORY_CARE_PROVIDER_SITE_OTHER): Payer: Medicare Other | Admitting: Cardiovascular Disease

## 2020-01-22 ENCOUNTER — Other Ambulatory Visit: Payer: Self-pay

## 2020-01-22 VITALS — BP 130/76 | HR 49 | Ht 68.0 in | Wt 215.4 lb

## 2020-01-22 DIAGNOSIS — Z23 Encounter for immunization: Secondary | ICD-10-CM

## 2020-01-22 DIAGNOSIS — R002 Palpitations: Secondary | ICD-10-CM | POA: Diagnosis not present

## 2020-01-22 DIAGNOSIS — I480 Paroxysmal atrial fibrillation: Secondary | ICD-10-CM

## 2020-01-22 NOTE — Patient Instructions (Addendum)

## 2020-01-23 ENCOUNTER — Other Ambulatory Visit: Payer: Self-pay | Admitting: Cardiovascular Disease

## 2020-02-13 ENCOUNTER — Other Ambulatory Visit: Payer: Self-pay | Admitting: Internal Medicine

## 2020-03-26 ENCOUNTER — Encounter: Payer: Medicare Other | Admitting: Internal Medicine

## 2020-04-07 ENCOUNTER — Other Ambulatory Visit: Payer: Self-pay | Admitting: Cardiovascular Disease

## 2020-04-07 NOTE — Telephone Encounter (Signed)
Age 70, weight 98kg, SCr 0.74 on 12/18/19 Last visit Oct 2021, PAF indication

## 2020-04-14 DIAGNOSIS — Z1152 Encounter for screening for COVID-19: Secondary | ICD-10-CM | POA: Diagnosis not present

## 2020-06-05 ENCOUNTER — Other Ambulatory Visit: Payer: Self-pay | Admitting: Adult Health

## 2020-06-21 ENCOUNTER — Encounter: Payer: Self-pay | Admitting: Internal Medicine

## 2020-06-21 NOTE — Progress Notes (Addendum)
RESCHEDULED

## 2020-06-22 ENCOUNTER — Encounter: Payer: Self-pay | Admitting: Internal Medicine

## 2020-06-23 ENCOUNTER — Ambulatory Visit: Payer: Medicare Other | Admitting: Internal Medicine

## 2020-06-23 DIAGNOSIS — I700 Atherosclerosis of aorta: Secondary | ICD-10-CM

## 2020-06-23 DIAGNOSIS — I712 Thoracic aortic aneurysm, without rupture: Secondary | ICD-10-CM

## 2020-06-23 DIAGNOSIS — I480 Paroxysmal atrial fibrillation: Secondary | ICD-10-CM

## 2020-06-23 DIAGNOSIS — Z79899 Other long term (current) drug therapy: Secondary | ICD-10-CM

## 2020-06-23 DIAGNOSIS — Z136 Encounter for screening for cardiovascular disorders: Secondary | ICD-10-CM

## 2020-06-23 DIAGNOSIS — E559 Vitamin D deficiency, unspecified: Secondary | ICD-10-CM

## 2020-06-23 DIAGNOSIS — Z87891 Personal history of nicotine dependence: Secondary | ICD-10-CM

## 2020-06-23 DIAGNOSIS — E782 Mixed hyperlipidemia: Secondary | ICD-10-CM

## 2020-06-23 DIAGNOSIS — R7309 Other abnormal glucose: Secondary | ICD-10-CM

## 2020-06-23 DIAGNOSIS — I10 Essential (primary) hypertension: Secondary | ICD-10-CM

## 2020-06-23 DIAGNOSIS — Z8249 Family history of ischemic heart disease and other diseases of the circulatory system: Secondary | ICD-10-CM

## 2020-06-23 DIAGNOSIS — Z1211 Encounter for screening for malignant neoplasm of colon: Secondary | ICD-10-CM

## 2020-06-24 ENCOUNTER — Encounter: Payer: Self-pay | Admitting: Internal Medicine

## 2020-06-24 NOTE — Patient Instructions (Signed)

## 2020-06-24 NOTE — Progress Notes (Unsigned)
Comprehensive Evaluation &  Examination     This very nice 70 y.o.  MWF presents for a comprehensive evaluation and management of multiple medical co-morbidities.  Patient has been followed for HTN, HLD, Prediabetes  and Vitamin D Deficiency.          HTN predates since 1996.  In 2004,  patient had a Negative /Normal Cardiolite.  In 2015, she was dx'd with pAfib & was started on Eliquis for CHADs2VASc and is followed by Truitt Merle, NP & Dr [name]. CTA in Jan 2019 discovered a 4.2 x 4.4 cm ascending Thoracis Aneurysm.  Patient's BP is checked infrequently at home and patient denies any cardiac symptoms as chest pain, shortness of breath, dizziness or ankle swelling, but does have occasional palpitations. Today's BP is at goal - 140/76 and rechecked at 132/72.      Patient's hyperlipidemia is controlled with diet and Pravastatin. Patient denies myalgias or other medication SE's. Last lipids were at goal except slightly elevated Trig's:  Lab Results  Component Value Date   CHOL 136 12/18/2019   HDL 55 12/18/2019   LDLCALC 56 12/18/2019   TRIG 170 (H) 12/18/2019   CHOLHDL 2.5 12/18/2019         Patient has moderate Obesity (BMI 39+) and consequent  prediabetes (A1c 5.8% /2012)and patient denies reactive hypoglycemic symptoms, visual blurring, diabetic polys or paresthesias. Last A1c was normal & at goal:  Lab Results  Component Value Date   HGBA1C 5.3 02/27/2019        Finally, patient has history of Vitamin D Deficiency ("18" /2008) and last Vitamin D was at goal:  Lab Results  Component Value Date   VD25OH 76 02/27/2019    Current Outpatient Medications on File Prior to Visit  Medication Sig  . VITAMIN C Take 2 times daily.  Marland Kitchen atenolol  50 MG tablet TAKE  1/2 to 1 tablet Daily for BP & palpitations  . VITAMIN D  8,000 Units  Take  daily.  Marland Kitchen CINNAMON 1,000 mg  Take  daily.   . diclofenac  1 % GEL Apply 4 g topically 4  times daily as needed.  . DiphenhydrAMINE  Take  as  needed (allergies).   Marland Kitchen ELIQUIS 5 MG TABS tablet TAKE 1 TABLET TWICE A DAY  . hydrochlorothiazide  25 MG tablet TAKE 1 TABLET DAILY   . loperamide 2 MG capsule Take 4 mg  daily.   . pravastatin  20 MG tablet Take 1 tablet every other day  . quinapril  40 MG tablet TAKE 1 TABLET DAILY   . RETIN-A 0.05 % cream Apply topically at bedtime.  . vitamin B-12 1000 MCG tablet Take  daily.     Allergies  Allergen Reactions  . Ciprofloxacin   . Levaquin [Levofloxacin In D5w]     No fluroquinolones due to ascending thoracic aortic aneurysm  . Penicillins Other (See Comments)    Had as a child=unknown reaction  . Zoloft [Sertraline Hcl]     Past Medical History:  Diagnosis Date  . Achilles tendonitis    R heal, limits activity  . Allergy   . Anxiety   . Elevated hemoglobin A1c   . Hyperlipidemia   . Hypertension   . IBS (irritable bowel syndrome)   . Insomnia   . Obesity   . Palpitations   . SVT (supraventricular tachycardia) ([place]) 10/29/2013  . Vitamin D deficiency     Health Maintenance  Topic Date Due  . COVID-19 Vaccine (  3 - Pfizer risk 4-dose series) 06/15/2019  . MAMMOGRAM  01/30/2021  . COLONOSCOPY  11/04/2022  . TETANUS/TDAP  10/26/2026  . INFLUENZA VACCINE  Completed  . DEXA SCAN  Completed  . Hepatitis C Screening  Completed  . PNA vac Low Risk Adult  Completed  . HPV VACCINES  Aged Out    Immunization History  Administered Date(s) Administered  . Fluad Quad(high Dose 65+) 12/15/2018, 01/22/2020  . Influenza Split 02/19/2013  . Influenza, High Dose Seasonal PF 01/25/2018  . Influenza, Seasonal, Injecte, Preservative Fre 03/12/2015  . Influenza,inj,Quad PF,6+ Mos 01/27/2017  . Influenza-Unspecified 04/06/2011  . PFIZER(Purple Top)SARS-COV-2 Vaccination 04/27/2019, 05/18/2019  . PPD Test 10/15/2013  . Pneumococcal Conjugate-13 01/25/2018  . Pneumococcal Polysaccharide-23 09/13/2019  . Pneumococcal-Unspecified 04/06/1995  . Td 10/25/2016  . Tdap 04/05/2006   . Zoster 07/04/2013    Last Colon - 08/134/2014 - Dr Delfin Edis - due10 year f/u -Aug 2024  Last MGM - 01/31/2019   Last BMD - 01/31/2019  T-1.7 Osteopenia recc f/u 2 years    Past Surgical History:  Procedure Laterality Date  . CESAREAN SECTION    . CESAREAN SECTION    . CHOLECYSTECTOMY  10 years ago  . TUBAL LIGATION Bilateral     Family History  Problem Relation Age of Onset  . Heart attack Mother   . Stroke Mother   . Heart disease Mother   . Diabetes Mother   . Macular degeneration Mother   . Heart attack Father   . Alcohol abuse Father   . Ovarian cancer Maternal Grandmother   . Colon cancer Neg Hx     Social History   Tobacco Use  . Smoking status: Former Smoker    Quit date: 04/01/2004    Years since quitting: 16.2  . Smokeless tobacco: Never Used  . Tobacco comment: Social smoker times 10 years  Vaping Use  . Vaping Use: Never used  Substance Use Topics  . Alcohol use: Yes    Alcohol/week: 0.0 standard drinks    Comment: rarely, < 1 beer per week  . Drug use: No    ROS Constitutional: Denies fever, chills, weight loss/gain, headaches, insomnia,  night sweats, and change in appetite. Does c/o fatigue. Eyes: Denies redness, blurred vision, diplopia, discharge, itchy, watery eyes.  ENT: Denies discharge, congestion, post nasal drip, epistaxis, sore throat, earache, hearing loss, dental pain, Tinnitus, Vertigo, Sinus pain, snoring.  Cardio: Denies chest pain, palpitations, irregular heartbeat, syncope, dyspnea, diaphoresis, orthopnea, PND, claudication, edema Respiratory: denies cough, dyspnea, DOE, pleurisy, hoarseness, laryngitis, wheezing.  Gastrointestinal: Denies dysphagia, heartburn, reflux, water brash, pain, cramps, nausea, vomiting, bloating, diarrhea, constipation, hematemesis, melena, hematochezia, jaundice, hemorrhoids Genitourinary: Denies dysuria, frequency, urgency, nocturia, hesitancy, discharge, hematuria, flank pain Breast: Breast  lumps, nipple discharge, bleeding.  Musculoskeletal: Denies arthralgia, myalgia, stiffness, Jt. Swelling, pain, limp, and strain/sprain. Denies falls. Skin: Denies puritis, rash, hives, warts, acne, eczema, changing in skin lesion Neuro: No weakness, tremor, incoordination, spasms, paresthesia, pain Psychiatric: Denies confusion, memory loss, sensory loss. Denies Depression. Endocrine: Denies change in weight, skin, hair change, nocturia, and paresthesia, diabetic polys, visual blurring, hyper / hypo glycemic episodes.  Heme/Lymph: No excessive bleeding, bruising, enlarged lymph nodes.  Physical Exam  BP 140/76   Pulse (!) 51   Temp 97.9 F (36.6 C)   Resp 16   Ht 5' 7.5" (1.715 m)   Wt 220 lb (99.8 kg)   SpO2 99%   BMI 33.95 kg/m   General Appearance: Well nourished,  well groomed and in no apparent distress.  Eyes: PERRLA, EOMs, conjunctiva no swelling or erythema, normal fundi and vessels. Sinuses: No frontal/maxillary tenderness ENT/Mouth: EACs patent / TMs  nl. Nares clear without erythema, swelling, mucoid exudates. Oral hygiene is good. No erythema, swelling, or exudate. Tongue normal, non-obstructing. Tonsils not swollen or erythematous. Hearing normal.  Neck: Supple, thyroid not palpable. No bruits, nodes or JVD. Respiratory: Respiratory effort normal.  BS equal and clear bilateral without rales, rhonci, wheezing or stridor. Cardio: Heart sounds are normal with regular rate and rhythm and no murmurs, rubs or gallops. Peripheral pulses are normal and equal bilaterally without edema. No aortic or femoral bruits. Chest: symmetric with normal excursions and percussion. Breasts: Symmetric, without lumps, nipple discharge, retractions, or fibrocystic changes.  Abdomen: Flat, soft with bowel sounds active. Nontender, no guarding, rebound, hernias, masses, or organomegaly.  Lymphatics: Non tender without lymphadenopathy.  Genitourinary:  Musculoskeletal: Full ROM all peripheral  extremities, joint stability, 5/5 strength, and normal gait. Skin: Warm and dry without rashes, lesions, cyanosis, clubbing or  ecchymosis.  Neuro: Cranial nerves intact, reflexes equal bilaterally. Normal muscle tone, no cerebellar symptoms. Sensation intact.  Pysch: Alert and oriented X 3, normal affect, Insight and Judgment appropriate.   Assessment and Plan   1. Essential hypertension  - EKG 12-Lead - Urinalysis, Routine w reflex microscopic - Microalbumin / creatinine urine ratio - CBC with Differential/Platelet - COMPLETE METABOLIC PANEL WITH GFR - Magnesium - TSH  2. Hyperlipidemia, mixed  - EKG 12-Lead - Lipid panel - TSH  3. Abnormal glucose  - EKG 12-Lead - Hemoglobin A1c - Insulin, random  4. Vitamin D deficiency  - VITAMIN D 25 Hydroxy (Vit-D Deficiency, Fractures)  5. Paroxysmal atrial fibrillation (HCC)  - EKG 12-Lead - TSH  6. Aortic atherosclerosis ([place]) by CTA 04/2019  - EKG 12-Lead - Lipid panel  7. Thoracic aortic aneurysm without rupture (HCC)  - EKG 12-Lead - Lipid panel  8. Morbid obesity ([place])  - TSH  9. Screening for colorectal cancer  - POC Hemoccult Bld/Stl   10. Screening for ischemic heart disease  - EKG 12-Lead  11. FHx: heart disease  - EKG 12-Lead  12. Former smoker  - EKG 12-Lead  13. Medication management  - Urinalysis, Routine w reflex microscopic - Microalbumin / creatinine urine ratio - CBC with Differential/Platelet - COMPLETE METABOLIC PANEL WITH GFR - Magnesium - Lipid panel - TSH - Hemoglobin A1c - Insulin, random - VITAMIN D 25 Hydroxy          Patient was counseled in prudent diet to achieve/maintain BMI less than 25 for weight control, BP monitoring, regular exercise and medications. Discussed med's effects and SE's. Screening labs and tests as requested with regular follow-up as recommended. Over 40 minutes of exam, counseling, chart review and high complex critical decision making was  performed.   Kirtland Bouchard, MD

## 2020-06-25 ENCOUNTER — Ambulatory Visit (INDEPENDENT_AMBULATORY_CARE_PROVIDER_SITE_OTHER): Payer: Medicare Other | Admitting: Internal Medicine

## 2020-06-25 ENCOUNTER — Encounter: Payer: Self-pay | Admitting: Internal Medicine

## 2020-06-25 ENCOUNTER — Other Ambulatory Visit: Payer: Self-pay

## 2020-06-25 VITALS — BP 140/76 | HR 51 | Temp 97.9°F | Resp 16 | Ht 67.5 in | Wt 220.0 lb

## 2020-06-25 DIAGNOSIS — I480 Paroxysmal atrial fibrillation: Secondary | ICD-10-CM

## 2020-06-25 DIAGNOSIS — R7309 Other abnormal glucose: Secondary | ICD-10-CM | POA: Diagnosis not present

## 2020-06-25 DIAGNOSIS — Z79899 Other long term (current) drug therapy: Secondary | ICD-10-CM | POA: Diagnosis not present

## 2020-06-25 DIAGNOSIS — I10 Essential (primary) hypertension: Secondary | ICD-10-CM

## 2020-06-25 DIAGNOSIS — E559 Vitamin D deficiency, unspecified: Secondary | ICD-10-CM

## 2020-06-25 DIAGNOSIS — E782 Mixed hyperlipidemia: Secondary | ICD-10-CM

## 2020-06-25 DIAGNOSIS — I700 Atherosclerosis of aorta: Secondary | ICD-10-CM

## 2020-06-25 DIAGNOSIS — I712 Thoracic aortic aneurysm, without rupture, unspecified: Secondary | ICD-10-CM

## 2020-06-25 DIAGNOSIS — Z8249 Family history of ischemic heart disease and other diseases of the circulatory system: Secondary | ICD-10-CM

## 2020-06-25 DIAGNOSIS — Z136 Encounter for screening for cardiovascular disorders: Secondary | ICD-10-CM

## 2020-06-25 DIAGNOSIS — Z1211 Encounter for screening for malignant neoplasm of colon: Secondary | ICD-10-CM

## 2020-06-25 DIAGNOSIS — Z87891 Personal history of nicotine dependence: Secondary | ICD-10-CM

## 2020-06-25 MED ORDER — ATENOLOL 25 MG PO TABS: ORAL_TABLET | ORAL | 0 refills | Status: DC

## 2020-06-25 MED ORDER — VALSARTAN 320 MG PO TABS: ORAL_TABLET | ORAL | 1 refills | Status: DC

## 2020-06-25 MED ORDER — OLMESARTAN MEDOXOMIL 40 MG PO TABS: ORAL_TABLET | ORAL | 3 refills | Status: DC

## 2020-06-25 NOTE — Addendum Note (Signed)
Addended by: Unk Pinto on: 06/25/2020 03:49 PM   Modules accepted: Orders

## 2020-06-26 NOTE — Progress Notes (Signed)
============================================================ -   Test results slightly outside the reference range are not unusual. If there is anything important, I will review this with you,  otherwise it is considered normal test values.  If you have further questions,  please do not hesitate to contact me at the office or via My Chart.  ============================================================ ============================================================  -  Total Chol = 162   and LDL Chol = 88   -  Both  Excellent   - Very low risk for Heart Attack  / Stroke ============================================================ ============================================================  -  A1c - Normal - Great - No Diabetes  ! ============================================================ ============================================================  -  Vitamin D = 96  - Excellent ! ============================================================ ============================================================  -  All Else - CBC - Kidneys - Electrolytes - Liver - Magnesium & Thyroid    - all  Normal / OK ============================================================ ============================================================

## 2020-06-27 LAB — MAGNESIUM: Magnesium: 2 mg/dL (ref 1.5–2.5)

## 2020-06-27 LAB — CBC WITH DIFFERENTIAL/PLATELET
Absolute Monocytes: 680 cells/uL (ref 200–950)
Basophils Absolute: 50 cells/uL (ref 0–200)
Basophils Relative: 1 %
Eosinophils Absolute: 190 cells/uL (ref 15–500)
Eosinophils Relative: 3.8 %
HCT: 39.9 % (ref 35.0–45.0)
Hemoglobin: 13.1 g/dL (ref 11.7–15.5)
Lymphs Abs: 1395 cells/uL (ref 850–3900)
MCH: 29.5 pg (ref 27.0–33.0)
MCHC: 32.8 g/dL (ref 32.0–36.0)
MCV: 89.9 fL (ref 80.0–100.0)
MPV: 11.2 fL (ref 7.5–12.5)
Monocytes Relative: 13.6 %
Neutro Abs: 2685 cells/uL (ref 1500–7800)
Neutrophils Relative %: 53.7 %
Platelets: 271 10*3/uL (ref 140–400)
RBC: 4.44 10*6/uL (ref 3.80–5.10)
RDW: 12 % (ref 11.0–15.0)
Total Lymphocyte: 27.9 %
WBC: 5 10*3/uL (ref 3.8–10.8)

## 2020-06-27 LAB — COMPLETE METABOLIC PANEL WITH GFR
AG Ratio: 1.4 (calc) (ref 1.0–2.5)
ALT: 14 U/L (ref 6–29)
AST: 18 U/L (ref 10–35)
Albumin: 4.2 g/dL (ref 3.6–5.1)
Alkaline phosphatase (APISO): 55 U/L (ref 37–153)
BUN: 14 mg/dL (ref 7–25)
CO2: 31 mmol/L (ref 20–32)
Calcium: 9.8 mg/dL (ref 8.6–10.4)
Chloride: 100 mmol/L (ref 98–110)
Creat: 0.8 mg/dL (ref 0.50–0.99)
GFR, Est African American: 88 mL/min/{1.73_m2} (ref >=60)
GFR, Est Non African American: 76 mL/min/{1.73_m2} (ref >=60)
Globulin: 3.1 g/dL (calc) (ref 1.9–3.7)
Glucose, Bld: 96 mg/dL (ref 65–99)
Potassium: 4.4 mmol/L (ref 3.5–5.3)
Sodium: 139 mmol/L (ref 135–146)
Total Bilirubin: 0.7 mg/dL (ref 0.2–1.2)
Total Protein: 7.3 g/dL (ref 6.1–8.1)

## 2020-06-27 LAB — LIPID PANEL
Cholesterol: 162 mg/dL (ref <200)
HDL: 51 mg/dL (ref >=50)
LDL Cholesterol (Calc): 88 mg/dL (calc)
Non-HDL Cholesterol (Calc): 111 mg/dL (calc) (ref <130)
Total CHOL/HDL Ratio: 3.2 (calc) (ref <5.0)
Triglycerides: 132 mg/dL (ref <150)

## 2020-06-27 LAB — HEMOGLOBIN A1C
Hgb A1c MFr Bld: 5.5 % of total Hgb (ref <5.7)
Mean Plasma Glucose: 111 mg/dL
eAG (mmol/L): 6.2 mmol/L

## 2020-06-27 LAB — URINALYSIS, ROUTINE W REFLEX MICROSCOPIC
Bilirubin Urine: NEGATIVE
Glucose, UA: NEGATIVE
Hgb urine dipstick: NEGATIVE
Ketones, ur: NEGATIVE
Leukocytes,Ua: NEGATIVE
Nitrite: NEGATIVE
Protein, ur: NEGATIVE
Specific Gravity, Urine: 1.011 (ref 1.001–1.03)
pH: 8 (ref 5.0–8.0)

## 2020-06-27 LAB — TSH: TSH: 1.1 mIU/L (ref 0.40–4.50)

## 2020-06-27 LAB — INSULIN, RANDOM: Insulin: 5.3 u[IU]/mL

## 2020-06-27 LAB — MICROALBUMIN / CREATININE URINE RATIO
Creatinine, Urine: 61 mg/dL (ref 20–275)
Microalb Creat Ratio: 3 mcg/mg creat (ref <30)
Microalb, Ur: 0.2 mg/dL

## 2020-06-27 LAB — VITAMIN D 25 HYDROXY (VIT D DEFICIENCY, FRACTURES): Vit D, 25-Hydroxy: 96 ng/mL (ref 30–100)

## 2020-07-08 ENCOUNTER — Other Ambulatory Visit: Payer: Self-pay | Admitting: Internal Medicine

## 2020-07-08 MED ORDER — DEXAMETHASONE 4 MG PO TABS: ORAL_TABLET | ORAL | 0 refills | Status: DC

## 2020-07-21 ENCOUNTER — Other Ambulatory Visit: Payer: Self-pay | Admitting: Internal Medicine

## 2020-07-21 DIAGNOSIS — M25552 Pain in left hip: Secondary | ICD-10-CM

## 2020-07-21 DIAGNOSIS — M25551 Pain in right hip: Secondary | ICD-10-CM

## 2020-07-30 DIAGNOSIS — M545 Low back pain, unspecified: Secondary | ICD-10-CM | POA: Diagnosis not present

## 2020-08-11 ENCOUNTER — Other Ambulatory Visit: Payer: Self-pay | Admitting: Internal Medicine

## 2020-08-11 DIAGNOSIS — I480 Paroxysmal atrial fibrillation: Secondary | ICD-10-CM

## 2020-08-11 DIAGNOSIS — I10 Essential (primary) hypertension: Secondary | ICD-10-CM

## 2020-08-11 MED ORDER — ATENOLOL 25 MG PO TABS: ORAL_TABLET | ORAL | 3 refills | Status: DC

## 2020-08-20 ENCOUNTER — Other Ambulatory Visit: Payer: Self-pay | Admitting: Adult Health

## 2020-09-25 ENCOUNTER — Ambulatory Visit: Payer: Medicare Other | Admitting: Adult Health

## 2020-09-29 DIAGNOSIS — H47323 Drusen of optic disc, bilateral: Secondary | ICD-10-CM | POA: Diagnosis not present

## 2020-10-02 ENCOUNTER — Other Ambulatory Visit: Payer: Self-pay | Admitting: Cardiovascular Disease

## 2020-10-02 NOTE — Telephone Encounter (Signed)
Pt last saw Dr [name] 01/22/20, last labs 06/25/20 Creat 0.80, age 70, weight 99.8kg, based on specified criteria pt is on appropriate dosage of Eliquis 5mg  BID.  Will refill rx.

## 2020-11-07 NOTE — Progress Notes (Signed)
MEDICARE ANNUAL WELLNESS VISIT AND FOLLOW UP  Assessment:    Annual Medicare Wellness Visit Due annually  Health maintenance reviewed Check about shingrix Schedule mammo, DEXA  Paroxysmal atrial fibrillation ([place]) Follows with cardio, on eliquis  Thoracic aortic aneurysm without rupture ([place]) Follows with cardio, stable 04/2019 CTA  Cardiology follows  Atherosclerosis of aorta ([place]) - CTA 04/2019 pravastatin 20 mg every other day initiated for LDL goal <100 Also discussed plant based, high fiber, low processed, low animal product diet  Control blood pressure, cholesterol, glucose, weight, increase exercise.   Essential hypertension - ABOVE goal on recheck; reviewed meds and lifestyle, on max tolerated current agents, declines addition of med today - will work on lifestyle, aggressive weight loss goal set, follow up in 2-3 months or sooner if persistently above goal - DASH diet, exercise and monitor at home. Call if greater than 130/80.  -     CBC with Differential/Platelet -     COMPLETE METABOLIC PANEL WITH GFR -     TSH  Morbid obesity - BMI 35+ with htn, hld - declines meds, plans to reduce snacking, weight loss goal of <210 lb set short term  - follow up 3 months for progress monitoring - increase veggies, decrease carbs - long discussion about weight loss, diet, and exercise  Mixed hyperlipidemia At goal LDL <100 check lipids decrease fatty foods  increase activity.  -     Lipid panel  Medication management -     CBC with Differential/Platelet -     COMPLETE METABOLIC PANEL WITH GFR -     Magnesium  Vitamin D deficiency Continue supplement  Abnormal glucose Discussed disease progression and risks Discussed diet/exercise, weight management and risk modification A1C annually   Insomnia, unspecified type Improved   Irritable bowel syndrome, unspecified type Can continue imodium She is up to date on colonoscopy  Localized osteoporosis without current  pathological fracture DEXA follow up due 01/2021 - order placed to schedule continue Vit D and Ca, weight bearing exercises continue fosamax to 01/2020, then stop for 1 year  Melasma  Continue sunscreen and tretinoin   Over 40 minutes of exam, counseling, chart review and critical decision making was performed Future Appointments  Date Time Provider [place]  02/18/2021  2:30 PM Liane Comber, NP [facility] None  04/24/2021 10:00 AM Josue Hector, MD [facility]  06/30/2021  2:00 PM Unk Pinto, MD [facility] None  11/10/2021  3:00 PM Liane Comber, NP [facility] None     Plan:   During the course of the visit the patient was educated and counseled about appropriate screening and preventive services including:   Pneumococcal vaccine  Prevnar 13 Influenza vaccine Td vaccine Screening electrocardiogram Bone densitometry screening Colorectal cancer screening Diabetes screening Glaucoma screening Nutrition counseling  Advanced directives: requested   Subjective:  Claudia [name] is a 70 y.o. female who presents for Medicare Annual Wellness Visit and 3 month follow up.   She has melasma, has completed course of hydroquinone, continues with tretinoin and doing well.   She has osteoporosis, started on fosamax x 01/2018, DEXA 01/2019 improved to osteopenia range and has been off since 12/2019.   She has IBS, will have a BM 2-3 x a day and will have urgency with it. Her husband is now retired and she would like to travel more.  She will take imodium 2 tablets before trips and do well with it.  BMI is Body mass index is 35.65 kg/m., she is working on  diet and exercise. She would like to lose down to <195 lb, admits to snacking.  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 [name]. On elequis and atenolol for a. Fib.  She does have cuff, hasn't been checking, today their BP is BP: (!) 142/78, similar on recheck  She does not workout, achilles bothering her (following with chiropractic, doing TENS unit with benefit). She denies chest pain, shortness of breath, dizziness.   She is on cholesterol medication (pravastatin 20 mg every other day) and denies myalgias. Her cholesterol is at goal. However she would like to start cholesterol medication due to aortic plaque. The cholesterol last visit was:   Lab Results  Component Value Date   CHOL 162 06/25/2020   HDL 51 06/25/2020   LDLCALC 88 06/25/2020   TRIG 132 06/25/2020   CHOLHDL 3.2 06/25/2020   . She has been working on diet and exercise for glucose management, and denies paresthesia of the feet, polydipsia and polyuria. Last A1C in the office was:  Lab Results  Component Value Date   HGBA1C 5.5 06/25/2020   Last GFR: Lab Results  Component Value Date   GFRNONAA 76 06/25/2020   Patient is on Vitamin D supplement.   Lab Results  Component Value Date   VD25OH 96 06/25/2020       Medication Review:   Current Outpatient Medications (Cardiovascular):    atenolol (TENORMIN) 25 MG tablet, Take  1 tablet  Daily  for BP   hydrochlorothiazide (HYDRODIURIL) 25 MG tablet, TAKE 1 TABLET DAILY FOR BLOOD PRESSURE AND FLUID RETENTION, ANKLE SWELLING   pravastatin (PRAVACHOL) 20 MG tablet, TAKE 1 TABLET EVERY OTHER DAY IN THE EVENING FOR CHOLESTEROL AND PLAQUE   valsartan (DIOVAN) 320 MG tablet, Take  1 tablet  at Night  for BP  Current Outpatient Medications (Respiratory):    DiphenhydrAMINE HCl (BENADRYL ALLERGY PO), Take by mouth as needed (allergies).    Current Outpatient Medications (Hematological):    apixaban (ELIQUIS) 5 MG TABS tablet, TAKE 1 TABLET TWICE A DAY   vitamin B-12 (CYANOCOBALAMIN) 1000 MCG tablet, Take 1,000 mcg by mouth  daily.  Current Outpatient Medications (Other):    Ascorbic Acid (VITAMIN C PO), Take by mouth 2 (two) times daily.   Cholecalciferol (VITAMIN D-3 PO), Take 8,000 Units by mouth daily.   CINNAMON PO, Take 1,000 mg by mouth daily.    diclofenac Sodium (VOLTAREN) 1 % GEL, Apply 4 g topically 4 (four) times daily as needed.   loperamide (IMODIUM) 2 MG capsule, Take 4 mg by mouth daily.    tretinoin (RETIN-A) 0.05 % cream, Apply topically at bedtime.  Allergies Allergies  Allergen Reactions   Ciprofloxacin    Levaquin [Levofloxacin In D5w]     No fluroquinolones due to ascending thoracic aortic aneurysm   Penicillins Other (See Comments)    Had as a child=unknown reaction   Zoloft [Sertraline Hcl]     Current Problems (verified) Patient Active Problem List   Diagnosis Date Noted   OAB (overactive bladder) 12/18/2019   Melasma 12/17/2019   Aortic atherosclerosis ([place]) by CTA 04/2019 06/05/2019   Osteoporosis 05/08/2018   DJD (degenerative joint disease) 05/08/2018   Thoracic aortic aneurysm ([place]) 08/13/2016   Obesity (BMI 30.0-34.9) 03/16/2015  Atrial fibrillation ([place]) 10/29/2013   Medication management 02/18/2013   Hypertension    Hyperlipidemia    Abnormal glucose    Vitamin D deficiency    IBS (irritable bowel syndrome)    Insomnia     Screening Tests  Immunization History  Administered Date(s) Administered   Fluad Quad(high Dose 65+) 12/15/2018, 01/22/2020   Influenza Split 02/19/2013   Influenza, High Dose Seasonal PF 01/25/2018   Influenza, Seasonal, Injecte, Preservative Fre 03/12/2015   Influenza,inj,Quad PF,6+ Mos 01/27/2017   Influenza-Unspecified 04/06/2011   PFIZER(Purple Top)SARS-COV-2 Vaccination 04/27/2019, 05/18/2019, 12/14/2019, 08/26/2020   PPD Test 10/15/2013   Pneumococcal Conjugate-13 01/25/2018   Pneumococcal Polysaccharide-23 09/13/2019   Pneumococcal-Unspecified 04/06/1995   Td 10/25/2016   Tdap 04/05/2006   Zoster, Live 07/04/2013    Preventative care: Last colonoscopy: 2014, normal, no hx of polyps Last mammogram: 01/2019, DUE, will schedule  Last pap smear/pelvic exam: 08/2019 at GYN, normal, DONE DEXA: 01/2019, R fem T -1.7, on fosamax since 01/2018, will stop 01/2020, repeat dexa 01/2021 - order placed Stress test 2018 Dr. [name], low risk study EF 86% Echo 2015  Prior vaccinations: TD or Tdap: 2018  Influenza: 2021 -  Pneumococcal: 1997, 09/2019 Prevnar13: 2019  Shingles/Zostavax: 2015 - check with insurance  Covid 19: 2/2, 2021, pfizer + boosters x 2  Names of Other Physician/Practitioners you currently use: 1. [facility] here for primary care 2. Dr. [place], eye doctor, last visit 2022 3. [facility], Dr. Dayna Barker, dentist, last visit 2022, goes annually, has scheduled    Patient Care Team: Unk Pinto, MD as PCP - General (Internal Medicine) Josue Hector, MD as Consulting Physician (Cardiology) [place], [place], [place] (Optometry)  SURGICAL HISTORY She  has a past surgical history that includes Cesarean section; Cholecystectomy (10 years ago); Tubal ligation (Bilateral); and Cesarean section. FAMILY HISTORY Her family history includes Alcohol abuse in her father; Diabetes in her mother; Heart attack in her father and mother; Heart disease in her mother; Macular degeneration in her mother; Ovarian cancer in her maternal grandmother; Stroke in her mother. SOCIAL HISTORY She  reports that she quit smoking about 16 years ago. Her smoking use included cigarettes. She has never used smokeless tobacco. She reports current alcohol use. She reports that she does not use drugs.   MEDICARE WELLNESS OBJECTIVES: Physical activity: Current Exercise Habits: Home exercise routine, Type of exercise: walking, Time (Minutes): 30, Frequency (Times/Week): 2, Weekly Exercise (Minutes/Week): 60, Intensity: Mild Cardiac risk factors: Cardiac Risk Factors include: advanced age (>7mn,  >>8women);dyslipidemia;hypertension;obesity (BMI >30kg/m2);sedentary lifestyle Depression/mood screen:   [facility] 11/10/2020  Decreased Interest 0  Down, Depressed, Hopeless 0  PHQ - 2 Score 0    ADLs:  In your present state of health, do you have any difficulty performing the following activities: 11/10/2020 06/24/2020  Hearing? N N  Vision? N N  Difficulty concentrating or making decisions? N N  Walking or climbing stairs? N N  Dressing or bathing? N N  Doing errands, shopping? N N  Some recent data might be hidden     Cognitive Testing  Alert? Yes  Normal Appearance?Yes  Oriented to person? Yes  Place? Yes   Time? Yes  Recall of three objects?  Yes  Can perform simple calculations? Yes  Displays appropriate judgment?Yes  Can read the correct time from a watch face?Yes  EOL planning: Does Patient Have a Medical Advance Directive?: No Would patient like information on creating a medical advance directive?:  No - Patient declined  Review of Systems  Constitutional:  Negative for malaise/fatigue and weight loss.  HENT:  Negative for hearing loss and tinnitus.   Eyes:  Negative for blurred vision and double vision.  Respiratory:  Negative for cough, sputum production, shortness of breath and wheezing.   Cardiovascular:  Negative for chest pain, palpitations, orthopnea, claudication, leg swelling and PND.  Gastrointestinal:  Negative for abdominal pain, blood in stool, constipation, diarrhea, heartburn, melena, nausea and vomiting.  Genitourinary: Negative.   Musculoskeletal:  Negative for falls, joint pain and myalgias.       Left ankle tenderness  Skin:  Negative for rash.  Neurological:  Negative for dizziness, tingling, sensory change, weakness and headaches.  Endo/Heme/Allergies:  Negative for polydipsia.  Psychiatric/Behavioral: Negative.  Negative for depression, memory loss, substance abuse and suicidal ideas. The patient is not nervous/anxious and does not  have insomnia.   All other systems reviewed and are negative.   Objective:     Today's Vitals   11/10/20 1427 11/10/20 1514  BP: 140/80 (!) 142/78  Pulse: (!) 48   Temp: (!) 97.2 F (36.2 C)   SpO2: 97%   Weight: 231 lb (104.8 kg)    Body mass index is 35.65 kg/m.  General appearance: alert, no distress, WD/WN, female HEENT: normocephalic, sclerae anicteric, TMs pearly, nares patent, no discharge or erythema, pharynx normal Oral cavity: MMM, no lesions Neck: supple, no lymphadenopathy, no thyromegaly, no masses Heart: RRR, normal S1, S2, no murmurs Lungs: CTA bilaterally, no wheezes, rhonchi, or rales Abdomen: +bs, soft, non tender, non distended, no masses, no hepatomegaly, no splenomegaly Musculoskeletal: Joints nontender, no swelling, no obvious deformity. Left achilles mildy tender/swelling (improved per pt).  Extremities: no edema, no cyanosis, no clubbing Pulses: 2+ symmetric, upper and lower extremities, normal cap refill Neurological: alert, oriented x 3, CN2-12 intact, strength normal upper extremities and lower extremities, sensation normal throughout, DTRs 2+ throughout, no cerebellar signs, gait normal Psychiatric: normal affect, behavior normal, pleasant   Medicare Attestation I have personally reviewed: The patient's medical and social history Their use of alcohol, tobacco or illicit drugs Their current medications and supplements The patient's functional ability including ADLs,fall risks, home safety risks, cognitive, and hearing and visual impairment Diet and physical activities Evidence for depression or mood disorders  The patient's weight, height, BMI, and visual acuity have been recorded in the chart.  I have made referrals, counseling, and provided education to the patient based on review of the above and I have provided the patient with a written personalized care plan for preventive services.     Izora Ribas, NP   11/10/2020

## 2020-11-10 ENCOUNTER — Ambulatory Visit (INDEPENDENT_AMBULATORY_CARE_PROVIDER_SITE_OTHER): Payer: Medicare Other | Admitting: Adult Health

## 2020-11-10 ENCOUNTER — Other Ambulatory Visit: Payer: Self-pay

## 2020-11-10 ENCOUNTER — Encounter: Payer: Self-pay | Admitting: Adult Health

## 2020-11-10 VITALS — BP 142/78 | HR 48 | Temp 97.2°F | Wt 231.0 lb

## 2020-11-10 DIAGNOSIS — Z0000 Encounter for general adult medical examination without abnormal findings: Secondary | ICD-10-CM

## 2020-11-10 DIAGNOSIS — Z79899 Other long term (current) drug therapy: Secondary | ICD-10-CM

## 2020-11-10 DIAGNOSIS — N3281 Overactive bladder: Secondary | ICD-10-CM | POA: Diagnosis not present

## 2020-11-10 DIAGNOSIS — I700 Atherosclerosis of aorta: Secondary | ICD-10-CM

## 2020-11-10 DIAGNOSIS — I712 Thoracic aortic aneurysm, without rupture, unspecified: Secondary | ICD-10-CM

## 2020-11-10 DIAGNOSIS — E782 Mixed hyperlipidemia: Secondary | ICD-10-CM | POA: Diagnosis not present

## 2020-11-10 DIAGNOSIS — I10 Essential (primary) hypertension: Secondary | ICD-10-CM | POA: Diagnosis not present

## 2020-11-10 DIAGNOSIS — E669 Obesity, unspecified: Secondary | ICD-10-CM

## 2020-11-10 DIAGNOSIS — M170 Bilateral primary osteoarthritis of knee: Secondary | ICD-10-CM | POA: Diagnosis not present

## 2020-11-10 DIAGNOSIS — M816 Localized osteoporosis [Lequesne]: Secondary | ICD-10-CM | POA: Diagnosis not present

## 2020-11-10 DIAGNOSIS — E559 Vitamin D deficiency, unspecified: Secondary | ICD-10-CM

## 2020-11-10 DIAGNOSIS — K589 Irritable bowel syndrome without diarrhea: Secondary | ICD-10-CM

## 2020-11-10 DIAGNOSIS — Z1231 Encounter for screening mammogram for malignant neoplasm of breast: Secondary | ICD-10-CM

## 2020-11-10 DIAGNOSIS — R6889 Other general symptoms and signs: Secondary | ICD-10-CM

## 2020-11-10 DIAGNOSIS — L811 Chloasma: Secondary | ICD-10-CM | POA: Diagnosis not present

## 2020-11-10 DIAGNOSIS — Z0001 Encounter for general adult medical examination with abnormal findings: Secondary | ICD-10-CM

## 2020-11-10 DIAGNOSIS — R7309 Other abnormal glucose: Secondary | ICD-10-CM

## 2020-11-10 DIAGNOSIS — G4700 Insomnia, unspecified: Secondary | ICD-10-CM | POA: Diagnosis not present

## 2020-11-10 DIAGNOSIS — I480 Paroxysmal atrial fibrillation: Secondary | ICD-10-CM

## 2020-11-10 NOTE — Patient Instructions (Addendum)
Goals      Blood Pressure < 130/80     DIET - INCREASE WATER INTAKE     65-80+ fluid ounces of clear sugar free liquid     Weight (lb) < 210 lb (95.3 kg)         HOW TO SCHEDULE A MAMMOGRAM  [facility]  7 a.m.-6:30 p.m., Monday 7 a.m.-5 p.m., Tuesday-Friday Schedule an appointment by calling [phone].  [facility] Schedule an appointment by calling [phone].      HYPERTENSION INFORMATION  Monitor your blood pressure at home, please keep a record and bring that in with you to your next office visit.   Go to the ER if any CP, SOB, nausea, dizziness, severe HA, changes vision/speech  Testing/Procedures: HOW TO TAKE YOUR BLOOD PRESSURE: Rest 5 minutes before taking your blood pressure. Don't smoke or drink caffeinated beverages for at least 30 minutes before. Take your blood pressure before (not after) you eat. Sit comfortably with your back supported and both feet on the floor (don't cross your legs). Elevate your arm to heart level on a table or a desk. Use the proper sized cuff. It should fit smoothly and snugly around your bare upper arm. There should be enough room to slip a fingertip under the cuff. The bottom edge of the cuff should be 1 inch above the crease of the elbow.   Your most recent BP: BP: (!) 142/78   Take your medications faithfully as instructed. Maintain a healthy weight. Get at least 150 minutes of aerobic exercise per week. Minimize salt intake. Minimize alcohol intake  DASH Eating Plan DASH stands for "Dietary Approaches to Stop Hypertension." The DASH eating plan is a healthy eating plan that has been shown to reduce high blood pressure (hypertension). Additional health benefits may include reducing the risk of type 2 diabetes mellitus, heart disease, and stroke. The DASH eating plan may also help with weight loss. WHAT DO I NEED TO KNOW ABOUT THE DASH EATING PLAN? For the DASH eating plan, you  will follow these general guidelines: Choose foods with a percent daily value for sodium of less than 5% (as listed on the food label). Use salt-free seasonings or herbs instead of table salt or sea salt. Check with your health care provider or pharmacist before using salt substitutes. Eat lower-sodium products, often labeled as "lower sodium" or "no salt added." Eat fresh foods. Eat more vegetables, fruits, and low-fat dairy products. Choose whole grains. Look for the word "whole" as the first word in the ingredient list. Choose fish and skinless chicken or [place] more often than red meat. Limit fish, poultry, and meat to 6 oz (170 g) each day. Limit sweets, desserts, sugars, and sugary drinks. Choose heart-healthy fats. Limit cheese to 1 oz (28 g) per day. Eat more home-cooked food and less restaurant, buffet, and fast food. Limit fried foods. Cook foods using methods other than frying. Limit canned vegetables. If you do use them, rinse them well to decrease the sodium. When eating at a restaurant, ask that your food be prepared with less salt, or no salt if possible. WHAT FOODS CAN I EAT? Seek help from a dietitian for individual calorie needs. Grains Whole grain or whole wheat bread. Brown rice. Whole grain or whole wheat pasta. Quinoa, bulgur, and whole grain cereals. Low-sodium cereals. Corn or whole wheat flour tortillas. Whole grain cornbread. Whole grain crackers. Low-sodium crackers. Vegetables Fresh or frozen vegetables (raw, steamed, roasted, or grilled).  Low-sodium or reduced-sodium tomato and vegetable juices. Low-sodium or reduced-sodium tomato sauce and paste. Low-sodium or reduced-sodium canned vegetables.  Fruits All fresh, canned (in natural juice), or frozen fruits. Meat and Other Protein Products Ground beef (85% or leaner), grass-fed beef, or beef trimmed of fat. Skinless chicken or [place]. Ground chicken or [place]. Pork trimmed of fat. All fish and seafood. Eggs.  Dried beans, peas, or lentils. Unsalted nuts and seeds. Unsalted canned beans. Dairy Low-fat dairy products, such as skim or 1% milk, 2% or reduced-fat cheeses, low-fat ricotta or cottage cheese, or plain low-fat yogurt. Low-sodium or reduced-sodium cheeses. Fats and Oils Tub margarines without trans fats. Light or reduced-fat mayonnaise and salad dressings (reduced sodium). Avocado. Safflower, olive, or canola oils. Natural peanut or almond butter. Other Unsalted popcorn and pretzels. The items listed above may not be a complete list of recommended foods or beverages. Contact your dietitian for more options. WHAT FOODS ARE NOT RECOMMENDED? Grains White bread. White pasta. White rice. Refined cornbread. Bagels and croissants. Crackers that contain trans fat. Vegetables Creamed or fried vegetables. Vegetables in a cheese sauce. Regular canned vegetables. Regular canned tomato sauce and paste. Regular tomato and vegetable juices. Fruits Dried fruits. Canned fruit in light or heavy syrup. Fruit juice. Meat and Other Protein Products Fatty cuts of meat. Ribs, chicken wings, bacon, sausage, bologna, salami, chitterlings, fatback, hot dogs, bratwurst, and packaged luncheon meats. Salted nuts and seeds. Canned beans with salt. Dairy Whole or 2% milk, cream, half-and-half, and cream cheese. Whole-fat or sweetened yogurt. Full-fat cheeses or blue cheese. Nondairy creamers and whipped toppings. Processed cheese, cheese spreads, or cheese curds. Condiments Onion and garlic salt, seasoned salt, table salt, and sea salt. Canned and packaged gravies. Worcestershire sauce. Tartar sauce. Barbecue sauce. Teriyaki sauce. Soy sauce, including reduced sodium. Steak sauce. Fish sauce. Oyster sauce. Cocktail sauce. Horseradish. Ketchup and mustard. Meat flavorings and tenderizers. Bouillon cubes. Hot sauce. Tabasco sauce. Marinades. Taco seasonings. Relishes. Fats and Oils Butter, stick margarine, lard, shortening,  ghee, and bacon fat. Coconut, palm kernel, or palm oils. Regular salad dressings. Other Pickles and olives. Salted popcorn and pretzels. The items listed above may not be a complete list of foods and beverages to avoid. Contact your dietitian for more information. WHERE CAN I FIND MORE INFORMATION? National Heart, Lung, and Blood Institute: travelstabloid.com Document Released: 03/11/2011 Document Revised: 08/06/2013 Document Reviewed: 01/24/2013 [facility] Patient Information 2015 [place], [place]. This information is not intended to replace advice given to you by your health care provider. Make sure you discuss any questions you have with your health care provider.     Zoster Vaccine, Recombinant injection What is this medication? ZOSTER VACCINE (ZOS ter vak SEEN) is a vaccine used to reduce the risk of getting shingles. This vaccine is not used to treat shingles or nerve pain fromshingles. This medicine may be used for other purposes; ask your health care provider orpharmacist if you have questions. COMMON BRAND NAME(S): [facility] What should I tell my care team before I take this medication? They need to know if you have any of these conditions: cancer immune system problems an unusual or allergic reaction to Zoster vaccine, other medications, foods, dyes, or preservatives pregnant or trying to get pregnant breast-feeding How should I use this medication? This vaccine is injected into a muscle. It is given by a health care provider. A copy of Vaccine Information Statements will be given before each vaccination. Be sure to read this information carefully each time. This sheet may  changeoften. Talk to your health care provider about the use of this vaccine in children.This vaccine is not approved for use in children. Overdosage: If you think you have taken too much of this medicine contact apoison control center or emergency room at once. NOTE: This  medicine is only for you. Do not share this medicine with others. What if I miss a dose? Keep appointments for follow-up (booster) doses. It is important not to miss your dose. Call your health care provider if you are unable to keep anappointment. What may interact with this medication? medicines that suppress your immune system medicines to treat cancer steroid medicines like prednisone or cortisone This list may not describe all possible interactions. Give your health care provider a list of all the medicines, herbs, non-prescription drugs, or dietary supplements you use. Also tell them if you smoke, drink alcohol, or use illegaldrugs. Some items may interact with your medicine. What should I watch for while using this medication? Visit your health care provider regularly. This vaccine, like all vaccines, may not fully protect everyone. What side effects may I notice from receiving this medication? Side effects that you should report to your doctor or health care professionalas soon as possible: allergic reactions (skin rash, itching or hives; swelling of the face, lips, or tongue) trouble breathing Side effects that usually do not require medical attention (report these toyour doctor or health care professional if they continue or are bothersome): chills headache fever nausea pain, redness, or irritation at site where injected tiredness vomiting This list may not describe all possible side effects. Call your doctor for medical advice about side effects. You may report side effects to FDA at1-800-FDA-1088. Where should I keep my medication? This vaccine is only given by a health care provider. It will not be stored athome. NOTE: This sheet is a summary. It may not cover all possible information. If you have questions about this medicine, talk to your doctor, pharmacist, orhealth care provider.  2022 Elsevier/Gold Standard (2019-04-27 16:23:07)

## 2020-11-11 LAB — MAGNESIUM: Magnesium: 2.1 mg/dL (ref 1.5–2.5)

## 2020-11-11 LAB — COMPLETE METABOLIC PANEL WITH GFR
AG Ratio: 1.3 (calc) (ref 1.0–2.5)
ALT: 11 U/L (ref 6–29)
AST: 17 U/L (ref 10–35)
Albumin: 4.3 g/dL (ref 3.6–5.1)
Alkaline phosphatase (APISO): 59 U/L (ref 37–153)
BUN: 14 mg/dL (ref 7–25)
CO2: 29 mmol/L (ref 20–32)
Calcium: 9.9 mg/dL (ref 8.6–10.4)
Chloride: 98 mmol/L (ref 98–110)
Creat: 0.66 mg/dL (ref 0.50–1.05)
Globulin: 3.3 g/dL (calc) (ref 1.9–3.7)
Glucose, Bld: 91 mg/dL (ref 65–99)
Potassium: 4.1 mmol/L (ref 3.5–5.3)
Sodium: 137 mmol/L (ref 135–146)
Total Bilirubin: 0.6 mg/dL (ref 0.2–1.2)
Total Protein: 7.6 g/dL (ref 6.1–8.1)
eGFR: 95 mL/min/{1.73_m2} (ref >=60)

## 2020-11-11 LAB — CBC WITH DIFFERENTIAL/PLATELET
Absolute Monocytes: 700 cells/uL (ref 200–950)
Basophils Absolute: 28 cells/uL (ref 0–200)
Basophils Relative: 0.5 %
Eosinophils Absolute: 179 cells/uL (ref 15–500)
Eosinophils Relative: 3.2 %
HCT: 39.7 % (ref 35.0–45.0)
Hemoglobin: 13.1 g/dL (ref 11.7–15.5)
Lymphs Abs: 1674 cells/uL (ref 850–3900)
MCH: 29.2 pg (ref 27.0–33.0)
MCHC: 33 g/dL (ref 32.0–36.0)
MCV: 88.6 fL (ref 80.0–100.0)
MPV: 11.2 fL (ref 7.5–12.5)
Monocytes Relative: 12.5 %
Neutro Abs: 3018 cells/uL (ref 1500–7800)
Neutrophils Relative %: 53.9 %
Platelets: 266 10*3/uL (ref 140–400)
RBC: 4.48 10*6/uL (ref 3.80–5.10)
RDW: 12.1 % (ref 11.0–15.0)
Total Lymphocyte: 29.9 %
WBC: 5.6 10*3/uL (ref 3.8–10.8)

## 2020-11-11 LAB — LIPID PANEL
Cholesterol: 160 mg/dL (ref <200)
HDL: 45 mg/dL — ABNORMAL LOW (ref >=50)
LDL Cholesterol (Calc): 90 mg/dL (calc)
Non-HDL Cholesterol (Calc): 115 mg/dL (calc) (ref <130)
Total CHOL/HDL Ratio: 3.6 (calc) (ref <5.0)
Triglycerides: 155 mg/dL — ABNORMAL HIGH (ref <150)

## 2020-11-11 LAB — TSH: TSH: 1.19 mIU/L (ref 0.40–4.50)

## 2020-11-24 DIAGNOSIS — M25561 Pain in right knee: Secondary | ICD-10-CM | POA: Diagnosis not present

## 2020-11-24 DIAGNOSIS — M25562 Pain in left knee: Secondary | ICD-10-CM | POA: Diagnosis not present

## 2020-12-01 ENCOUNTER — Other Ambulatory Visit: Payer: Self-pay | Admitting: Internal Medicine

## 2020-12-01 DIAGNOSIS — I10 Essential (primary) hypertension: Secondary | ICD-10-CM

## 2020-12-17 DIAGNOSIS — M1712 Unilateral primary osteoarthritis, left knee: Secondary | ICD-10-CM | POA: Diagnosis not present

## 2020-12-17 DIAGNOSIS — M1711 Unilateral primary osteoarthritis, right knee: Secondary | ICD-10-CM | POA: Diagnosis not present

## 2020-12-24 DIAGNOSIS — M1712 Unilateral primary osteoarthritis, left knee: Secondary | ICD-10-CM | POA: Diagnosis not present

## 2020-12-24 DIAGNOSIS — M1711 Unilateral primary osteoarthritis, right knee: Secondary | ICD-10-CM | POA: Diagnosis not present

## 2020-12-31 DIAGNOSIS — M1712 Unilateral primary osteoarthritis, left knee: Secondary | ICD-10-CM | POA: Diagnosis not present

## 2020-12-31 DIAGNOSIS — M1711 Unilateral primary osteoarthritis, right knee: Secondary | ICD-10-CM | POA: Diagnosis not present

## 2021-01-02 ENCOUNTER — Ambulatory Visit: Payer: Medicare Other

## 2021-01-23 DIAGNOSIS — Z23 Encounter for immunization: Secondary | ICD-10-CM | POA: Diagnosis not present

## 2021-01-30 DIAGNOSIS — M1712 Unilateral primary osteoarthritis, left knee: Secondary | ICD-10-CM | POA: Diagnosis not present

## 2021-01-30 DIAGNOSIS — M1711 Unilateral primary osteoarthritis, right knee: Secondary | ICD-10-CM | POA: Diagnosis not present

## 2021-02-06 DIAGNOSIS — M1712 Unilateral primary osteoarthritis, left knee: Secondary | ICD-10-CM | POA: Diagnosis not present

## 2021-02-06 DIAGNOSIS — M1711 Unilateral primary osteoarthritis, right knee: Secondary | ICD-10-CM | POA: Diagnosis not present

## 2021-02-09 ENCOUNTER — Other Ambulatory Visit: Payer: Self-pay | Admitting: Adult Health

## 2021-02-18 ENCOUNTER — Ambulatory Visit: Payer: TRICARE For Life (TFL) | Admitting: Adult Health

## 2021-03-19 ENCOUNTER — Encounter: Payer: Self-pay | Admitting: Internal Medicine

## 2021-03-25 DIAGNOSIS — M1712 Unilateral primary osteoarthritis, left knee: Secondary | ICD-10-CM | POA: Diagnosis not present

## 2021-03-25 DIAGNOSIS — M1711 Unilateral primary osteoarthritis, right knee: Secondary | ICD-10-CM | POA: Diagnosis not present

## 2021-03-31 ENCOUNTER — Telehealth: Payer: Self-pay | Admitting: Cardiovascular Disease

## 2021-03-31 ENCOUNTER — Other Ambulatory Visit: Payer: Self-pay | Admitting: Cardiovascular Disease

## 2021-03-31 DIAGNOSIS — I480 Paroxysmal atrial fibrillation: Secondary | ICD-10-CM

## 2021-03-31 MED ORDER — APIXABAN 5 MG PO TABS: 5.0000 mg | ORAL_TABLET | Freq: Two times a day (BID) | ORAL | 0 refills | Status: DC

## 2021-03-31 NOTE — Telephone Encounter (Signed)
Eliquis 5mg  refill request received. Patient is 70 years old, weight-104.8kg, Crea-0.66 on 11/10/2020, Diagnosis-Afib, and last seen by Dr. [name] on 01/22/2020-overdue & has an appt pending with Dr. [name] on 04/24/2021. Dose is appropriate based on dosing criteria. Will send in refill to requested pharmacy.

## 2021-03-31 NOTE — Telephone Encounter (Signed)
°*  STAT* If patient is at the pharmacy, call can be transferred to refill team.   1. Which medications need to be refilled? (please list name of each medication and dose if known) need a new prescription to local pharmacy   For Eliquis until her mail order comes   2. Which pharmacy/location (including street and city if local pharmacy) is medication to be sent to? [place]   3. Do they need a 30 day or 90 day supply? 30 days

## 2021-03-31 NOTE — Telephone Encounter (Signed)
Eliquis 5mg  refill request received. Patient is 70 years old, weight-104.8kg, Crea-0.66 on 11/10/2020, Diagnosis-Afib, and last seen by Dr. [name] on 01/22/2020-overdue & has an appt pending with Dr. [name] on 04/24/2021. Dose is appropriate based on dosing criteria. Will send in refill to requested pharmacy 30 day supply while awaiting mail order rx.

## 2021-04-16 NOTE — Progress Notes (Signed)
CARDIOLOGY OFFICE NOTE  Date:  04/24/2021    Claudia [name] Date of Birth: October 14, 1950 Medical Record #[id number]  PCP:  Unk Pinto, MD  Cardiologist:  [name]  No chief complaint on file.   History of Present Illness:  71 y.o. with HTN, PAF, DM likely OSA but no sleep study done. She has CHA2VASC score 3 and is maintained on eliquis. Tends toward bradycardia and beta blocker dose has been decreased   Event monitor benign PAC;s 10/26/16 Myovue normal EF 68% 10/19/16   Stable mild aortic root dilatation by CT 04/24/19 3.8 cm   Primary did lots blood work including TSH which was normal   IBS bothering her more   Lost close to 40 lbs with weight watchers has bad knees but activity better Told her not to take Tumeric with her eliquis Discussed Ozempic Rx for weight loss   No pre syncope or excessively slow pulse     Past Medical History:  Diagnosis Date   Achilles tendonitis    R heal, limits activity   Allergy    Anxiety    Elevated hemoglobin A1c    Hyperlipidemia    Hypertension    IBS (irritable bowel syndrome)    Insomnia    Obesity    Palpitations    SVT (supraventricular tachycardia) ([place]) 10/29/2013   Vitamin D deficiency     Past Surgical History:  Procedure Laterality Date   CESAREAN SECTION     CESAREAN SECTION     CHOLECYSTECTOMY  10 years ago   TUBAL LIGATION Bilateral      Medications: Current Meds  Medication Sig   apixaban (ELIQUIS) 5 MG TABS tablet Take 1 tablet (5 mg total) by mouth 2 (two) times daily. Please keep upcoming appt.   Ascorbic Acid (VITAMIN C PO) Take by mouth 2 (two) times daily.   atenolol (TENORMIN) 25 MG tablet Take  1 tablet  Daily  for BP   Cholecalciferol (VITAMIN D-3 PO) Take 8,000 Units by mouth daily.   CINNAMON PO Take 1,000 mg by mouth daily.    diclofenac Sodium (VOLTAREN) 1 % GEL Apply 4 g topically 4 (four) times daily as needed.   DiphenhydrAMINE HCl (BENADRYL ALLERGY PO) Take by mouth as needed  (allergies).    hydrochlorothiazide (HYDRODIURIL) 25 MG tablet TAKE 1 TABLET DAILY FOR BLOOD PRESSURE AND FLUID RETENTION, ANKLE SWELLING   loperamide (IMODIUM) 2 MG capsule Take 4 mg by mouth daily.    pravastatin (PRAVACHOL) 20 MG tablet TAKE 1 TABLET EVERY OTHER DAY IN THE EVENING FOR CHOLESTEROL AND PLAQUE   tretinoin (RETIN-A) 0.05 % cream Apply topically at bedtime.   valsartan (DIOVAN) 320 MG tablet TAKE 1 TABLET AT NIGHT FOR BLOOD PRESSURE   vitamin B-12 (CYANOCOBALAMIN) 1000 MCG tablet Take 1,000 mcg by mouth daily.     Allergies: Allergies  Allergen Reactions   Ciprofloxacin    Levaquin [Levofloxacin In D5w]     No fluroquinolones due to ascending thoracic aortic aneurysm   Penicillins Other (See Comments)    Had as a child=unknown reaction   Zoloft [Sertraline Hcl]     Social History: The patient  reports that she quit smoking about 17 years ago. Her smoking use included cigarettes. She has never used smokeless tobacco. She reports current alcohol use. She reports that she does not use drugs.   Family History: The patient's family history includes Alcohol abuse in her father; Diabetes in her mother; Heart attack in her father and mother;  Heart disease in her mother; Macular degeneration in her mother; Ovarian cancer in her maternal grandmother; Stroke in her mother.   Review of Systems: Please see the history of present illness.   Otherwise, the review of systems is positive for none.   All other systems are reviewed and negative.   Physical Exam: VS:  BP (!) 142/64    Pulse (!) 50    Ht 5\' 8"  (1.727 m)    Wt 223 lb (101.2 kg)    SpO2 97%    BMI 33.91 kg/m  .  BMI Body mass index is 33.91 kg/m.  Wt Readings from Last 3 Encounters:  04/24/21 223 lb (101.2 kg)  11/10/20 231 lb (104.8 kg)  06/25/20 220 lb (99.8 kg)   Affect appropriate Healthy:  appears stated age 71: normal Neck supple with no adenopathy JVP normal no bruits no thyromegaly Lungs clear with no  wheezing and good diaphragmatic motion Heart:  S1/S2 2/6 SEM murmur, no rub, gallop or click PMI normal Abdomen: benighn, BS positve, no tenderness, no AAA no bruit.  No HSM or HJR Distal pulses intact with no bruits No edema Neuro non-focal Skin warm and dry No muscular weakness    LABORATORY DATA:  EKG:  10/12/16  This demonstrates NSR.  01/22/20 SR rate 49 normal   Lab Results  Component Value Date   WBC 5.6 11/10/2020   HGB 13.1 11/10/2020   HCT 39.7 11/10/2020   PLT 266 11/10/2020   GLUCOSE 91 11/10/2020   CHOL 160 11/10/2020   TRIG 155 (H) 11/10/2020   HDL 45 (L) 11/10/2020   LDLCALC 90 11/10/2020   ALT 11 11/10/2020   AST 17 11/10/2020   NA 137 11/10/2020   K 4.1 11/10/2020   CL 98 11/10/2020   CREATININE 0.66 11/10/2020   BUN 14 11/10/2020   CO2 29 11/10/2020   TSH 1.19 11/10/2020   HGBA1C 5.5 06/25/2020   MICROALBUR 0.2 06/25/2020     BNP (last 3 results) No results for input(s): BNP in the last 8760 hours.  ProBNP (last 3 results) No results for input(s): PROBNP in the last 8760 hours.   Other Studies Reviewed Today:  Echo Study Conclusions from 2015  - Left ventricle: The cavity size was normal. Wall thickness   was increased in a pattern of mild LVH. Systolic function   was normal. The estimated ejection fraction was in the   range of 60% to 65%. Wall motion was normal; there were no   regional wall motion abnormalities. Features are   consistent with a pseudonormal left ventricular filling   pattern, with concomitant abnormal relaxation and   increased filling pressure (grade 2 diastolic   dysfunction). - Aortic valve: There was no stenosis. - Aorta: Ascending aortic diameter: 18mm (S). - Ascending aorta: The ascending aorta was mildly dilated. - Mitral valve: No significant regurgitation. - Left atrium: The atrium was mildly dilated. - Right ventricle: The cavity size was normal. Systolic   function was normal. - Right atrium: The  atrium was mildly dilated. - Tricuspid valve: Peak RV-RA gradient: 29mm Hg (S). - Pulmonary arteries: PA peak pressure: 20mm Hg (S). - Inferior vena cava: The vessel was normal in size; the   respirophasic diameter changes were in the normal range (=   50%); findings are consistent with normal central venous   pressure. Impressions:  - Normal LV size and systolic function, EF 38-10%. Mild LV   hypertrophy. Moderate diastolic dysfunction. Normal RV  size and systolic function. No significant valvular   abnormalities. Mildly dilated ascending aorta.     CTA CHEST MPRESSION   04/24/19:  Stable aorta root 3.9 cm orthogonal planes    Assessment/Plan:  1. IBS:  Continue Bentyl f/u GI   2. Palpitations most recent event monitor 10/26/16 only benign PAC;s   3. Obesity - doing better weight down from fall but still not at baseline    4. History of bradycardia - stable on 1/2 dose atenolol. She does use extra prn.    5. Snoring - She has declined sleep study in the past. Declined again today despite this being a risk factor for AF.    6. Dilated aorta - most recent CT 04/24/19 only 3.8 cm   7. PAF: CHADVASC 3 no recurrence since 2015 on eliquis   8. HLD:  On statin LDL 90  f/u primary   9. HTN:  Well controlled.  Continue current medications and low sodium Dash type diet.    F/U with cardiology in a year      Jenkins Rouge

## 2021-04-24 ENCOUNTER — Other Ambulatory Visit: Payer: Self-pay

## 2021-04-24 ENCOUNTER — Encounter: Payer: Self-pay | Admitting: Cardiovascular Disease

## 2021-04-24 ENCOUNTER — Ambulatory Visit (INDEPENDENT_AMBULATORY_CARE_PROVIDER_SITE_OTHER): Payer: Medicare Other | Admitting: Cardiovascular Disease

## 2021-04-24 VITALS — BP 142/64 | HR 50 | Ht 68.0 in | Wt 223.0 lb

## 2021-04-24 DIAGNOSIS — I480 Paroxysmal atrial fibrillation: Secondary | ICD-10-CM | POA: Diagnosis not present

## 2021-04-24 DIAGNOSIS — Z7901 Long term (current) use of anticoagulants: Secondary | ICD-10-CM | POA: Diagnosis not present

## 2021-04-24 DIAGNOSIS — Z5181 Encounter for therapeutic drug level monitoring: Secondary | ICD-10-CM | POA: Diagnosis not present

## 2021-04-24 DIAGNOSIS — R001 Bradycardia, unspecified: Secondary | ICD-10-CM

## 2021-04-24 NOTE — Patient Instructions (Signed)
Medication Instructions:  NO CHANGES *If you need a refill on your cardiac medications before your next appointment, please call your pharmacy*   Lab Work: NONE If you have labs (blood work) drawn today and your tests are completely normal, you will receive your results only by: [place] (if you have MyChart) OR A paper copy in the mail If you have any lab test that is abnormal or we need to change your treatment, we will call you to review the results.   Testing/Procedures: NONE   Follow-Up: At [facility], you and your health needs are our priority.  As part of our continuing mission to provide you with exceptional heart care, we have created designated Provider Care Teams.  These Care Teams include your primary Cardiologist (physician) and Advanced Practice Providers (APPs -  Physician Assistants and Nurse Practitioners) who all work together to provide you with the care you need, when you need it.  We recommend signing up for the patient portal called "MyChart".  Sign up information is provided on this After Visit Summary.  MyChart is used to connect with patients for Virtual Visits (Telemedicine).  Patients are able to view lab/test results, encounter notes, upcoming appointments, etc.  Non-urgent messages can be sent to your provider as well.   To learn more about what you can do with MyChart, go to [email or web address].    Your next appointment:   1 year(s)  The format for your next appointment:   In Person  Provider:   DR [name]     Other Instructions NONE

## 2021-05-05 ENCOUNTER — Other Ambulatory Visit: Payer: Medicare Other

## 2021-05-05 ENCOUNTER — Ambulatory Visit
Admission: RE | Admit: 2021-05-05 | Discharge: 2021-05-05 | Disposition: A | Discharge status: Home / Self Care | Payer: Medicare Other | Source: Ambulatory Visit | Attending: Adult Health | Admitting: Adult Health

## 2021-05-05 DIAGNOSIS — Z1231 Encounter for screening mammogram for malignant neoplasm of breast: Secondary | ICD-10-CM

## 2021-05-08 ENCOUNTER — Other Ambulatory Visit: Payer: Self-pay

## 2021-05-08 ENCOUNTER — Ambulatory Visit
Admission: RE | Admit: 2021-05-08 | Discharge: 2021-05-08 | Disposition: A | Discharge status: Home / Self Care | Payer: Medicare Other | Source: Ambulatory Visit | Attending: Adult Health | Admitting: Adult Health

## 2021-05-08 DIAGNOSIS — M816 Localized osteoporosis [Lequesne]: Secondary | ICD-10-CM

## 2021-05-08 DIAGNOSIS — M8589 Other specified disorders of bone density and structure, multiple sites: Secondary | ICD-10-CM | POA: Diagnosis not present

## 2021-05-08 DIAGNOSIS — Z780 Asymptomatic menopausal state: Secondary | ICD-10-CM | POA: Diagnosis not present

## 2021-05-18 ENCOUNTER — Encounter: Payer: Self-pay | Admitting: Adult Health

## 2021-05-19 ENCOUNTER — Encounter: Payer: Self-pay | Admitting: Cardiovascular Disease

## 2021-05-19 ENCOUNTER — Encounter: Payer: Self-pay | Admitting: Adult Health

## 2021-06-29 ENCOUNTER — Other Ambulatory Visit: Payer: Self-pay | Admitting: Cardiovascular Disease

## 2021-06-29 DIAGNOSIS — I480 Paroxysmal atrial fibrillation: Secondary | ICD-10-CM

## 2021-06-29 NOTE — Telephone Encounter (Signed)
Prescription refill request for Eliquis received. ?Indication: Afib  ?Last office visit: 04/24/21 [name])  ?Scr: 0.66 (11/10/20)  ?Age: 71 ?Weight: 101.2kg ? ?Appropriate dose and refill sent to requested pharmacy.  ?

## 2021-06-30 ENCOUNTER — Encounter: Payer: Self-pay | Admitting: Internal Medicine

## 2021-06-30 ENCOUNTER — Other Ambulatory Visit: Payer: Self-pay

## 2021-06-30 ENCOUNTER — Ambulatory Visit (INDEPENDENT_AMBULATORY_CARE_PROVIDER_SITE_OTHER): Payer: Medicare Other | Admitting: Internal Medicine

## 2021-06-30 VITALS — BP 132/80 | HR 67 | Temp 97.9°F | Resp 17 | Ht 67.5 in | Wt 229.4 lb

## 2021-06-30 DIAGNOSIS — Z79899 Other long term (current) drug therapy: Secondary | ICD-10-CM

## 2021-06-30 DIAGNOSIS — R7309 Other abnormal glucose: Secondary | ICD-10-CM

## 2021-06-30 DIAGNOSIS — Z8249 Family history of ischemic heart disease and other diseases of the circulatory system: Secondary | ICD-10-CM

## 2021-06-30 DIAGNOSIS — I700 Atherosclerosis of aorta: Secondary | ICD-10-CM | POA: Diagnosis not present

## 2021-06-30 DIAGNOSIS — I480 Paroxysmal atrial fibrillation: Secondary | ICD-10-CM

## 2021-06-30 DIAGNOSIS — E559 Vitamin D deficiency, unspecified: Secondary | ICD-10-CM

## 2021-06-30 DIAGNOSIS — I10 Essential (primary) hypertension: Secondary | ICD-10-CM | POA: Diagnosis not present

## 2021-06-30 DIAGNOSIS — I712 Thoracic aortic aneurysm, without rupture, unspecified: Secondary | ICD-10-CM

## 2021-06-30 DIAGNOSIS — E782 Mixed hyperlipidemia: Secondary | ICD-10-CM

## 2021-06-30 DIAGNOSIS — Z136 Encounter for screening for cardiovascular disorders: Secondary | ICD-10-CM

## 2021-06-30 DIAGNOSIS — Z87891 Personal history of nicotine dependence: Secondary | ICD-10-CM | POA: Diagnosis not present

## 2021-06-30 DIAGNOSIS — Z1212 Encounter for screening for malignant neoplasm of rectum: Secondary | ICD-10-CM

## 2021-06-30 MED ORDER — MIRABEGRON ER 50 MG PO TB24: ORAL_TABLET | ORAL | 0 refills | Status: DC

## 2021-06-30 NOTE — Progress Notes (Signed)
? ?Comprehensive Evaluation &  Examination ? ?Future Appointments  ?Date Time Provider Department  ?06/30/2021                   CPE  2:00 PM Unk Pinto, MD [facility]  ?11/10/2021                     Wellness  3:00 PM Liane Comber, NP [facility]  ?07/06/2022                     CPE  2:00 PM Unk Pinto, MD [facility]  ? ? ?    This very nice 71 y.o. MWF presents for comprehensive evaluation and management of multiple medical co-morbidities.  Patient has been followed for HTN, HLD, Prediabetes  and Vitamin D Deficiency. ? ? ?     HTN predates circa 1996.  In 2004, patient was dx'd w/pAfib & started on Eliquis. Patient follows annually with Dr [name].  CTA in 2019 found an ascending Thoracic Aortic Aneurysm which is followed annually by Dr [name]. Patient's BP has been controlled at home and patient denies any cardiac symptoms as chest pain, palpitations, shortness of breath, dizziness or ankle swelling. Today's BP  is at goal -  132/80.  ? ? ?    Patient's hyperlipidemia is controlled with diet and Pravastatin. Patient denies myalgias or other medication SE's. Last lipids were at goal : ? ?Lab Results  ?Component Value Date  ? CHOL 160 11/10/2020  ? HDL 45 (L) 11/10/2020  ? [place] 90 11/10/2020  ? TRIG 155 (H) 11/10/2020  ? CHOLHDL 3.6 11/10/2020  ? ? ? ?    Patient has moderate obesity  ( BMI 39+) and hx/o prediabetes  (A1c 5.8% /2012) and patient denies reactive hypoglycemic symptoms, visual blurring, diabetic polys or paresthesias. Last A1c was normal & at goal : ? ?Lab Results  ?Component Value Date  ? HGBA1C 5.5 06/25/2020  ? ? ? ?    Finally, patient has history of Vitamin D Deficiency ("18" /2008) and last Vitamin D was at goal : ? ?Lab Results  ?Component Value Date  ? VD25OH 96 06/25/2020  ? ? ? ?Current Outpatient Medications on File Prior to Visit  ?Medication Sig  ? apixaban (ELIQUIS) 5 MG TABS  TAKE 1 TABLET TWICE DAILY   ? VITAMIN C  Take by mouth 2 times daily.  ? atenolol 25 MG tablet Take   1 tablet  Daily  for BP  ? VITAMIN D  8,000 Units  Take daily.  ? CINNAMON 1,000 mg Take daily.   ? diclofenac 1 % GEL Apply 4 g topically 4  times daily as needed.  ? DiphenhydrAMINE  Take as needed   ? hydrochlorothiazide  25 MG tablet TAKE 1 TABLET DAILY  ? loperamide 2 MG capsule Take 4 mg by mouth daily.   ? pravastatin 20 MG tablet TAKE 1 TABLET EVERY OTHER DAY I  ? RETIN-A 0.05 % cream Apply topically at bedtime.  ? valsartan 320 MG tablet TAKE 1 TABLET AT NIGHT   ? vitamin B-12  1000 MCG tablet Take  daily.  ? ? ? ?Allergies  ?Allergen Reactions  ? Ciprofloxacin   ? Levaquin [Levofloxacin In D5w]   ?  No fluroquinolones due to ascending thoracic aortic aneurysm  ? Penicillins Other (See Comments)  ?  Had as a child=unknown reaction  ? Zoloft [Sertraline Hcl]   ? ? ? ?Past Medical History:  ?Diagnosis Date  ?  Achilles tendonitis   ? R heel, limits activity  ? Allergy   ? Anxiety   ? Elevated hemoglobin A1c   ? Hyperlipidemia   ? Hypertension   ? IBS (irritable bowel syndrome)   ? Insomnia   ? Obesity   ? Palpitations   ? SVT (supraventricular tachycardia) ([place]) 10/29/2013  ? Vitamin D deficiency   ? ? ? ?Health Maintenance  ?Topic Date Due  ? Zoster Vaccines- Shingrix (1 of 2) Never done  ? COVID-19 Vaccine (5 - Booster for Pfizer series) 10/21/2020  ? MAMMOGRAM  05/06/2023  ? DEXA SCAN  05/09/2023  ? TETANUS/TDAP  10/26/2026  ? Pneumonia Vaccine 81+ Years old  Completed  ? INFLUENZA VACCINE  Completed  ? Hepatitis C Screening  Completed  ? HPV VACCINES  Aged Out  ? ? ? ?Immunization History  ?Administered Date(s) Administered  ? Fluad Quad(high Dose) 12/15/2018, 01/22/2020  ? Influenza Split 02/19/2013  ? Influenza, High Dose  01/25/2018  ? Influenza, Seasonal 03/12/2015  ? Influenza,inj,Quad   01/27/2017  ? Influenza  04/06/2011  ? PFIZER SARS-COV-2 Vacc  04/27/2019, 05/18/2019,  ?12/14/2019, 08/26/2020  ? PPD Test 10/15/2013  ? Pneumococcal -13 01/25/2018  ? Pneumococcal -23 09/13/2019  ? Pneumococcal -23  04/06/1995  ? Td 10/25/2016  ? Tdap 04/05/2006  ? Zoster, Live 07/04/2013  ? ? ?Last Colon - 11/03/2012 - Dr Delfin Edis - due 10 year f/u - Aug 2024 ? ? ?Last MGM - 05/05/2021 ? ?Last dexaBMD - 05/08/2021 with T = -1.8 -> Osteopenia & recc 2 year  f/u due Feb 2025 ? ? ?Past Surgical History:  ?Procedure Laterality Date  ? CESAREAN SECTION    ? CESAREAN SECTION    ? CHOLECYSTECTOMY  10 years ago  ? TUBAL LIGATION Bilateral   ? ? ? ?Family History  ?Problem Relation Age of Onset  ? Heart attack Mother   ? Stroke Mother   ? Heart disease Mother   ? Diabetes Mother   ? Macular degeneration Mother   ? Heart attack Father   ? Alcohol abuse Father   ? Ovarian cancer Maternal Grandmother   ? Colon cancer Neg Hx   ? ? ? ?Social History  ? ?Tobacco Use  ? Smoking status: Former  ?  Types: Cigarettes  ?  Quit date: 04/01/2004  ?  Years since quitting: 17.2  ? Smokeless tobacco: Never  ? Tobacco comments:  ?  Social smoker times 10 years  ?Vaping Use  ? Vaping Use: Never used  ?Substance Use Topics  ? Alcohol use: Yes  ?  Alcohol/week: 0.0 standard drinks  ?  Comment: rarely, < 1 beer per week  ? Drug use: No  ? ? ? ? ROS ?Constitutional: Denies fever, chills, weight loss/gain, headaches, insomnia,  night sweats, and change in appetite. Does c/o fatigue. ?Eyes: Denies redness, blurred vision, diplopia, discharge, itchy, watery eyes.  ?ENT: Denies discharge, congestion, post nasal drip, epistaxis, sore throat, earache, hearing loss, dental pain, Tinnitus, Vertigo, Sinus pain, snoring.  ?Cardio: Denies chest pain, palpitations, irregular heartbeat, syncope, dyspnea, diaphoresis, orthopnea, PND, claudication, edema ?Respiratory: denies cough, dyspnea, DOE, pleurisy, hoarseness, laryngitis, wheezing.  ?Gastrointestinal: Denies dysphagia, heartburn, reflux, water brash, pain, cramps, nausea, vomiting, bloating, diarrhea, constipation, hematemesis, melena, hematochezia, jaundice, hemorrhoids ?Genitourinary: Denies dysuria,  frequency, urgency, nocturia, hesitancy, discharge, hematuria, flank pain ?Breast: Breast lumps, nipple discharge, bleeding.  ?Musculoskeletal: Denies arthralgia, myalgia, stiffness, Jt. Swelling, pain, limp, and strain/sprain. Denies falls. ?Skin: Denies puritis, rash, hives,  warts, acne, eczema, changing in skin lesion ?Neuro: No weakness, tremor, incoordination, spasms, paresthesia, pain ?Psychiatric: Denies confusion, memory loss, sensory loss. Denies Depression. ?Endocrine: Denies change in weight, skin, hair change, nocturia, and paresthesia, diabetic polys, visual blurring, hyper / hypo glycemic episodes.  ?Heme/Lymph: No excessive bleeding, bruising, enlarged lymph nodes. ? ?Physical Exam ? ?BP 132/80   Pulse 67   Temp 97.9 ?F (36.6 ?C)   Resp 17   Ht 5' 7.5" (1.715 m)   Wt 229 lb 6.4 oz (104.1 kg)   SpO2 96%   BMI 35.40 kg/m?  ? ?General Appearance: Over nourished, well groomed and in no apparent distress. ? ?Eyes: PERRLA, EOMs, conjunctiva no swelling or erythema, normal fundi and vessels. ?Sinuses: No frontal/maxillary tenderness ?ENT/Mouth: EACs patent / TMs  nl. Nares clear without erythema, swelling, mucoid exudates. Oral hygiene is good. No erythema, swelling, or exudate. Tongue normal, non-obstructing. Tonsils not swollen or erythematous. Hearing normal.  ?Neck: Supple, thyroid not palpable. No bruits, nodes or JVD. ?Respiratory: Respiratory effort normal.  BS equal and clear bilateral without rales, rhonci, wheezing or stridor. ?Cardio: Heart sounds are normal with regular rate and rhythm and no murmurs, rubs or gallops. Peripheral pulses are normal and equal bilaterally without edema. No aortic or femoral bruits. ?Chest: symmetric with normal excursions and percussion. ?Breasts: Symmetric, without lumps, nipple discharge, retractions, or fibrocystic changes.  ?Abdomen: Flat, soft with bowel sounds active. Nontender, no guarding, rebound, hernias, masses, or organomegaly.  ?Lymphatics: Non  tender without lymphadenopathy.  ?Musculoskeletal: Full ROM all peripheral extremities, joint stability, 5/5 strength, and normal gait. ?Skin: Warm and dry without rashes, lesions, cyanosis, clubbing or  ecchymosis.

## 2021-06-30 NOTE — Patient Instructions (Signed)

## 2021-07-01 LAB — TSH: TSH: 1.26 mIU/L (ref 0.40–4.50)

## 2021-07-01 LAB — CBC WITH DIFFERENTIAL/PLATELET
Absolute Monocytes: 690 cells/uL (ref 200–950)
Basophils Absolute: 42 cells/uL (ref 0–200)
Basophils Relative: 0.7 %
Eosinophils Absolute: 132 cells/uL (ref 15–500)
Eosinophils Relative: 2.2 %
HCT: 38.7 % (ref 35.0–45.0)
Hemoglobin: 12.8 g/dL (ref 11.7–15.5)
Lymphs Abs: 1764 cells/uL (ref 850–3900)
MCH: 29.1 pg (ref 27.0–33.0)
MCHC: 33.1 g/dL (ref 32.0–36.0)
MCV: 88 fL (ref 80.0–100.0)
MPV: 10.7 fL (ref 7.5–12.5)
Monocytes Relative: 11.5 %
Neutro Abs: 3372 cells/uL (ref 1500–7800)
Neutrophils Relative %: 56.2 %
Platelets: 259 10*3/uL (ref 140–400)
RBC: 4.4 10*6/uL (ref 3.80–5.10)
RDW: 12.4 % (ref 11.0–15.0)
Total Lymphocyte: 29.4 %
WBC: 6 10*3/uL (ref 3.8–10.8)

## 2021-07-01 LAB — URINALYSIS, ROUTINE W REFLEX MICROSCOPIC
Bilirubin Urine: NEGATIVE
Glucose, UA: NEGATIVE
Hgb urine dipstick: NEGATIVE
Ketones, ur: NEGATIVE
Leukocytes,Ua: NEGATIVE
Nitrite: NEGATIVE
Protein, ur: NEGATIVE
Specific Gravity, Urine: 1.007 (ref 1.001–1.035)
pH: 5.5 (ref 5.0–8.0)

## 2021-07-01 LAB — COMPLETE METABOLIC PANEL WITH GFR
AG Ratio: 1.4 (calc) (ref 1.0–2.5)
ALT: 14 U/L (ref 6–29)
AST: 23 U/L (ref 10–35)
Albumin: 4.4 g/dL (ref 3.6–5.1)
Alkaline phosphatase (APISO): 63 U/L (ref 37–153)
BUN: 14 mg/dL (ref 7–25)
CO2: 27 mmol/L (ref 20–32)
Calcium: 9.8 mg/dL (ref 8.6–10.4)
Chloride: 102 mmol/L (ref 98–110)
Creat: 0.69 mg/dL (ref 0.50–1.05)
Globulin: 3.1 g/dL (calc) (ref 1.9–3.7)
Glucose, Bld: 89 mg/dL (ref 65–99)
Potassium: 4 mmol/L (ref 3.5–5.3)
Sodium: 138 mmol/L (ref 135–146)
Total Bilirubin: 0.7 mg/dL (ref 0.2–1.2)
Total Protein: 7.5 g/dL (ref 6.1–8.1)
eGFR: 94 mL/min/{1.73_m2} (ref >=60)

## 2021-07-01 LAB — HEMOGLOBIN A1C
Hgb A1c MFr Bld: 5.5 % of total Hgb (ref <5.7)
Mean Plasma Glucose: 111 mg/dL
eAG (mmol/L): 6.2 mmol/L

## 2021-07-01 LAB — LIPID PANEL
Cholesterol: 199 mg/dL (ref <200)
HDL: 58 mg/dL (ref >=50)
LDL Cholesterol (Calc): 113 mg/dL (calc) — ABNORMAL HIGH
Non-HDL Cholesterol (Calc): 141 mg/dL (calc) — ABNORMAL HIGH (ref <130)
Total CHOL/HDL Ratio: 3.4 (calc) (ref <5.0)
Triglycerides: 160 mg/dL — ABNORMAL HIGH (ref <150)

## 2021-07-01 LAB — MAGNESIUM: Magnesium: 2.1 mg/dL (ref 1.5–2.5)

## 2021-07-01 LAB — INSULIN, RANDOM: Insulin: 11.1 u[IU]/mL

## 2021-07-01 LAB — MICROALBUMIN / CREATININE URINE RATIO
Creatinine, Urine: 23 mg/dL (ref 20–275)
Microalb, Ur: <0.2 mg/dL

## 2021-07-01 LAB — VITAMIN D 25 HYDROXY (VIT D DEFICIENCY, FRACTURES): Vit D, 25-Hydroxy: 76 ng/mL (ref 30–100)

## 2021-07-01 NOTE — Progress Notes (Signed)
<><><><><><><><><><><><><><><><><><><><><><><><><><><><><><><><><> ?<><><><><><><><><><><><><><><><><><><><><><><><><><><><><><><><><> ?-   Test results slightly outside the reference range are not unusual. ?If there is anything important, I will review this with you,  ?otherwise it is considered normal test values.  ?If you have further questions,  ?please do not hesitate to contact me at the office or via My Chart.  ?<><><><><><><><><><><><><><><><><><><><><><><><><><><><><><><><><> ?<><><><><><><><><><><><><><><><><><><><><><><><><><><><><><><><><> ? ?-  Total  Chol =  199     - Elevated from previous  ( 160 )  ?           (  Ideal  or  Goal is less than 180  !  )  ? ?- and  ? ?-  Bad / Dangerous LDL  Chol =     113   - also Elevated from previous (113)  ?            (  Ideal  or  Goal is less than 70  !  )  ? ?- Cholesterol is too high - Recommend a STRICTER  low cholesterol diet  ?<><><><><><><><><><><><><><><><><><><><><><><><><><><><><><><><><> ? ?- Cholesterol only comes from animal sources  ?- ie. meat, dairy, egg yolks ? ?- Eat all the vegetables you want. ? ?- Avoid meat, especially red meat - Beef AND Pork . ? ?- Avoid cheese & dairy - milk & ice cream.    ? ?- Cheese is the most concentrated form of trans-fats which  ?is the worst thing to clog up our arteries.  ? ?- Veggie cheese is OK which can be found in the fresh  ?produce section at [employer] or [employer] or [employer] ?<><><><><><><><><><><><><><><><><><><><><><><><><><><><><><><><><> ?<><><><><><><><><><><><><><><><><><><><><><><><><><><><><><><><><> ? ?-  Vitamin DF = 76 - Excellent  ?<><><><><><><><><><><><><><><><><><><><><><><><><><><><><><><><><> ?<><><><><><><><><><><><><><><><><><><><><><><><><><><><><><><><><> ? ?-  All Else - CBC - Kidneys - Electrolytes - Liver - Magnesium & Thyroid   ? ?- all  Normal /  OK ? ?<><><><><><><><><><><><><><><><><><><><><><><><><><><><><><><><><> ?<><><><><><><><><><><><><><><><><><><><><><><><><><><><><><><><><> ? ? ? ? ? ? ? ? ? ? ? ? ?

## 2021-07-20 ENCOUNTER — Encounter: Payer: Self-pay | Admitting: Internal Medicine

## 2021-07-31 ENCOUNTER — Emergency Department (HOSPITAL_BASED_OUTPATIENT_CLINIC_OR_DEPARTMENT_OTHER): Payer: Medicare Other | Admitting: Radiology

## 2021-07-31 ENCOUNTER — Encounter (HOSPITAL_BASED_OUTPATIENT_CLINIC_OR_DEPARTMENT_OTHER): Payer: Self-pay

## 2021-07-31 ENCOUNTER — Encounter: Payer: Self-pay | Admitting: Internal Medicine

## 2021-07-31 ENCOUNTER — Other Ambulatory Visit: Payer: Self-pay

## 2021-07-31 DIAGNOSIS — R002 Palpitations: Secondary | ICD-10-CM | POA: Insufficient documentation

## 2021-07-31 DIAGNOSIS — Z7901 Long term (current) use of anticoagulants: Secondary | ICD-10-CM | POA: Diagnosis not present

## 2021-07-31 DIAGNOSIS — R001 Bradycardia, unspecified: Secondary | ICD-10-CM | POA: Insufficient documentation

## 2021-07-31 DIAGNOSIS — M79601 Pain in right arm: Secondary | ICD-10-CM | POA: Insufficient documentation

## 2021-07-31 LAB — BASIC METABOLIC PANEL
Anion gap: 7 (ref 5–15)
BUN: 17 mg/dL (ref 8–23)
CO2: 31 mmol/L (ref 22–32)
Calcium: 10.1 mg/dL (ref 8.9–10.3)
Chloride: 100 mmol/L (ref 98–111)
Creatinine, Ser: 0.76 mg/dL (ref 0.44–1.00)
GFR, Estimated: >60 mL/min (ref >60)
Glucose, Bld: 130 mg/dL — ABNORMAL HIGH (ref 70–99)
Potassium: 3.5 mmol/L (ref 3.5–5.1)
Sodium: 138 mmol/L (ref 135–145)

## 2021-07-31 LAB — URINALYSIS, ROUTINE W REFLEX MICROSCOPIC
Bilirubin Urine: NEGATIVE
Glucose, UA: NEGATIVE mg/dL
Hgb urine dipstick: NEGATIVE
Ketones, ur: NEGATIVE mg/dL
Leukocytes,Ua: NEGATIVE
Nitrite: NEGATIVE
Protein, ur: NEGATIVE mg/dL
Specific Gravity, Urine: 1.006 (ref 1.005–1.030)
pH: 6 (ref 5.0–8.0)

## 2021-07-31 LAB — CBC
HCT: 41 % (ref 36.0–46.0)
Hemoglobin: 13.3 g/dL (ref 12.0–15.0)
MCH: 28.7 pg (ref 26.0–34.0)
MCHC: 32.4 g/dL (ref 30.0–36.0)
MCV: 88.4 fL (ref 80.0–100.0)
Platelets: 275 10*3/uL (ref 150–400)
RBC: 4.64 MIL/uL (ref 3.87–5.11)
RDW: 13 % (ref 11.5–15.5)
WBC: 6.8 10*3/uL (ref 4.0–10.5)
nRBC: 0 % (ref 0.0–0.2)

## 2021-07-31 NOTE — ED Triage Notes (Signed)
Presented via triage with c/o heart palpitations that started last night and continued throughout the day. Pt admits that she started having R arm pain tonight.  ? ?Denies n/v  ?Cardiac hx  ?

## 2021-08-01 ENCOUNTER — Emergency Department (HOSPITAL_BASED_OUTPATIENT_CLINIC_OR_DEPARTMENT_OTHER)
Admission: EM | Admit: 2021-08-01 | Discharge: 2021-08-01 | Disposition: A | Discharge status: Home / Self Care | Payer: Medicare Other | Attending: Emergency Medicine | Admitting: Emergency Medicine

## 2021-08-01 DIAGNOSIS — R002 Palpitations: Secondary | ICD-10-CM

## 2021-08-01 NOTE — ED Provider Notes (Signed)
? ?[facility]  ?Provider Note ? ?CSN: [id number] ?Arrival date & time: 07/31/21 2057 ? ?History ?Chief Complaint  ?Patient presents with  ? Palpitations  ? ? ?Claudia [name] is a 71 y.o. female with remote history of afib on Eliquis and Atenolol which was recently changed from '25mg'$  to 12.'5mg'$  due to episodes of bradycardia reports she has been having palpitations all day today. Feels like heart is skipping beats, similar to previous for which she would usually take an extra 12.'5mg'$  atenolol but today that did not seem to help. She had some aching pain in R arm, but no CP, SOB, nausea or diaphoresis. She does not drink much caffeine. No other reported stimulant use.  ? ? ?Home Medications ?Prior to Admission medications   ?Medication Sig Start Date End Date Taking? Authorizing Provider  ?apixaban (ELIQUIS) 5 MG TABS tablet TAKE 1 TABLET TWICE DAILY (PLEASE KEEP UPCOMING APPOINTMENT, PENDING APPOINTMENT IN JANUARY 2023, WILL EVALUATE FOR REFILLS AT THAT TIME) 06/29/21   Josue Hector, MD  ?Ascorbic Acid (VITAMIN C PO) Take by mouth 2 (two) times daily.    [provider]  ?atenolol (TENORMIN) 25 MG tablet Take  1 tablet  Daily  for BP 08/11/20   Unk Pinto, MD  ?Cholecalciferol (VITAMIN D-3 PO) Take 8,000 Units by mouth daily.    [provider]  ?CINNAMON PO Take 1,000 mg by mouth daily.     [provider]  ?diclofenac Sodium (VOLTAREN) 1 % GEL Apply 4 g topically 4 (four) times daily as needed.    [provider]  ?DiphenhydrAMINE HCl (BENADRYL ALLERGY PO) Take by mouth as needed (allergies).     [provider]  ?hydrochlorothiazide (HYDRODIURIL) 25 MG tablet TAKE 1 TABLET DAILY FOR BLOOD PRESSURE AND FLUID RETENTION, ANKLE SWELLING 02/09/21   Magda Bernheim, NP  ?loperamide (IMODIUM) 2 MG capsule Take 4 mg by mouth daily.     [provider]  ?mirabegron ER (MYRBETRIQ) 50 MG TB24 tablet Take 1 tablet Daily for Bladder 06/30/21    Unk Pinto, MD  ?pravastatin (PRAVACHOL) 20 MG tablet TAKE 1 TABLET EVERY OTHER DAY IN THE EVENING FOR CHOLESTEROL AND PLAQUE 08/20/20   Liane Comber, NP  ?tretinoin (RETIN-A) 0.05 % cream Apply topically at bedtime. 12/18/19   Liane Comber, NP  ?valsartan (DIOVAN) 320 MG tablet TAKE 1 TABLET AT NIGHT FOR BLOOD PRESSURE 12/01/20   Liane Comber, NP  ?vitamin B-12 (CYANOCOBALAMIN) 1000 MCG tablet Take 1,000 mcg by mouth daily.    [provider]  ? ? ? ?Allergies    ?Ciprofloxacin, Levaquin [levofloxacin in d5w], Penicillins, and Zoloft [sertraline hcl] ? ? ?Review of Systems   ?Review of Systems ?Please see HPI for pertinent positives and negatives ? ?Physical Exam ?BP (!) 150/65 (BP Location: Right Arm)   Pulse 68   Temp 98.7 ?F (37.1 ?C) (Oral)   Resp 20   Ht 5' 7.5" (1.715 m)   Wt 102.1 kg   SpO2 98%   BMI 34.72 kg/m?  ? ?Physical Exam ?Vitals and nursing note reviewed.  ?Constitutional:   ?   Appearance: Normal appearance.  ?HENT:  ?   Head: Normocephalic and atraumatic.  ?   Nose: Nose normal.  ?   Mouth/Throat:  ?   Mouth: Mucous membranes are moist.  ?Eyes:  ?   Extraocular Movements: Extraocular movements intact.  ?   Conjunctiva/sclera: Conjunctivae normal.  ?Cardiovascular:  ?   Rate and Rhythm: Normal rate.  Rhythm irregular.  ?Pulmonary:  ?   Effort: Pulmonary effort is normal.  ?   Breath sounds: Normal breath sounds.  ?Abdominal:  ?   General: Abdomen is flat.  ?   Palpations: Abdomen is soft.  ?   Tenderness: There is no abdominal tenderness.  ?Musculoskeletal:     ?   General: No swelling. Normal range of motion.  ?   Cervical back: Neck supple.  ?Skin: ?   General: Skin is warm and dry.  ?Neurological:  ?   General: No focal deficit present.  ?   Mental Status: She is alert.  ?Psychiatric:     ?   Mood and Affect: Mood normal.  ? ? ?ED Results / Procedures / Treatments   ?EKG ?EKG Interpretation ? ?Date/Time:  Friday July 31 2021 21:17:29 EDT ?Ventricular Rate:  66 ?PR  Interval:    ?QRS Duration: 106 ?QT Interval:  418 ?QTC Calculation: 438 ?R Axis:   -14 ?Text Interpretation: Sinus rhythm Premature atrial complexes Minimal voltage criteria for LVH, may be normal variant ( Cornell product ) Abnormal ECG When compared with ECG of 23-Dec-2008 14:06, Premature atrial complexes NOW PRESENT Confirmed by Calvert Cantor [phone]) on 08/01/2021 12:55:12 AM ? ?Procedures ?Procedures ? ?Medications Ordered in the ED ?Medications - No data to display ? ?Initial Impression and Plan ? Patient here with palpitations. EKG shows PACs, also there were a few PVCs on monitor. Not in afib. No symptoms concerning for cardiac ischemia. Labs done in triage show normal CBC, BMP and UA. CXR is clear. Patient reports her palpitations have improved while waiting in the ED. Recommend she increase her Atenolol back to '25mg'$  over the weekend, provided her HR stays above 60. Follow up with Cardiology next week. Continue eliquis in the meantime. RTED for any worsening symptoms or other concerns.  ? ?ED Course  ? ?  ? ? ?MDM Rules/Calculators/A&P ?Medical Decision Making ?Given presenting complaint, I considered that admission might be necessary. After review of results from ED lab and/or imaging studies, admission to the hospital is not indicated at this time.  ? ? ?Problems Addressed: ?Palpitations: acute illness or injury ? ?Amount and/or Complexity of Data Reviewed ?Labs: ordered. Decision-making details documented in ED Course. ?Radiology: ordered and independent interpretation performed. Decision-making details documented in ED Course. ?ECG/medicine tests: ordered and independent interpretation performed. Decision-making details documented in ED Course. ? ?Risk ?Prescription drug management. ?Decision regarding hospitalization. ? ? ? ?Final Clinical Impression(s) / ED Diagnoses ?Final diagnoses:  ?Palpitations  ? ? ?Rx / DC Orders ?ED Discharge Orders   ? ?      Ordered  ?  Ambulatory referral to Cardiology        ? 08/01/21 0057  ? ?  ?  ? ?  ? ?  ?Truddie Hidden, MD ?08/01/21 [phone] ? ?

## 2021-08-01 NOTE — ED Notes (Signed)
Pt reports ongoing palpitations -- denies active RUE pain; denies sob, vision changes, HA, diaphoresis, nausea -- pt agreeable with d/c plan as discussed by provider - this nurse has verbally reinforced d/c instructions and provided pt with written copy- pt acknowledges verbal understanding and denies any additional questions, concerns, needs- ambulatory independently with steady gait at d/c -- no distress- vitals stable.   ?

## 2021-08-03 NOTE — Progress Notes (Signed)
?Cardiology Office Note:   ? ?Date:  08/04/2021  ? ?ID:  Claudia [name], DOB [birth year], MRN [id number] ? ?PCP:  Unk Pinto, MD ?  ?[facility] Providers ?Cardiologist:  Jenkins Rouge, MD    ? ?Referring MD: Unk Pinto, MD  ? ?Chief Complaint: worsening palpitations ? ?History of Present Illness:   ? ?Claudia [name] is a very pleasant 71 y.o. female with a hx of hypertension, PAF, diabetes, mild aortic root dilatation by CT, palpitations, PACs, bradycardia, obesity, and snoring.  ? ?She has been on chronic anticoagulation with Eliquis for history of PAF and CHA2DS2-VASc score of 3.  History of bradycardia on low-dose beta-blocker.  Suspected to have OSA but no sleep study. Normal LVEF, no significant valve disease, by echo 2015. Low risk myoview 10/2016. She has maintained consistent follow-up.  ? ?She was last seen in our office on 04/24/2021 by Dr. [name] at which time no changes were made and 1 year follow-up was recommended.  ? ?Presented to ED on 08/01/2021 with concerns for palpitations. Cardiac telemetry and EKG revealed PACs, no a fib.  Symptoms were not concerning for cardiac ischemia and she was advised to follow-up with cardiology. ? ?Today, she is here alone.  She reports that on 07/29/2021 she woke up and felt palpitations, she is unsure as to whether these awakened her.  Palpitations continued and she went to ED on 4/29 due to continuation of symptoms. Feels heart beat faster with activity, however she does not notice worsening palpitations.  She is active at home but does not exercise. Drinks 3 16 oz of decaf coffee daily and only about 32 oz of water. Notices leg cramps on occasion that she thinks are 2/2 dehydration. She denies chest pain, shortness of breath, lower extremity edema, fatigue, melena, hematuria, hemoptysis, diaphoresis, weakness, presyncope, syncope, orthopnea, and PND.  She has been trying to split a very small 25 mg atenolol tablet, admits to likely taking  inconsistent dosing.  ? ?Past Medical History:  ?Diagnosis Date  ? Achilles tendonitis   ? R heal, limits activity  ? Allergy   ? Anxiety   ? Elevated hemoglobin A1c   ? Hyperlipidemia   ? Hypertension   ? IBS (irritable bowel syndrome)   ? Insomnia   ? Obesity   ? Palpitations   ? SVT (supraventricular tachycardia) ([place]) 10/29/2013  ? Vitamin D deficiency   ? ? ?Past Surgical History:  ?Procedure Laterality Date  ? CESAREAN SECTION    ? CESAREAN SECTION    ? CHOLECYSTECTOMY  10 years ago  ? TUBAL LIGATION Bilateral   ? ? ?Current Medications: ?Current Meds  ?Medication Sig  ? apixaban (ELIQUIS) 5 MG TABS tablet TAKE 1 TABLET TWICE DAILY (PLEASE KEEP UPCOMING APPOINTMENT, PENDING APPOINTMENT IN JANUARY 2023, WILL EVALUATE FOR REFILLS AT THAT TIME)  ? Ascorbic Acid (VITAMIN C PO) Take by mouth 2 (two) times daily.  ? Cholecalciferol (VITAMIN D-3 PO) Take 8,000 Units by mouth daily.  ? CINNAMON PO Take 1,000 mg by mouth daily.   ? diclofenac Sodium (VOLTAREN) 1 % GEL Apply 4 g topically 4 (four) times daily as needed.  ? DiphenhydrAMINE HCl (BENADRYL ALLERGY PO) Take by mouth as needed (allergies).   ? hydrochlorothiazide (HYDRODIURIL) 25 MG tablet TAKE 1 TABLET DAILY FOR BLOOD PRESSURE AND FLUID RETENTION, ANKLE SWELLING  ? loperamide (IMODIUM) 2 MG capsule Take 4 mg by mouth daily.   ? metoprolol succinate (TOPROL XL) 25 MG 24 hr tablet Take 0.5 tablets (  12.5 mg total) by mouth daily.  ? potassium chloride (KLOR-CON M) 10 MEQ tablet Take 1 tablet (10 mEq total) by mouth daily.  ? pravastatin (PRAVACHOL) 20 MG tablet TAKE 1 TABLET EVERY OTHER DAY IN THE EVENING FOR CHOLESTEROL AND PLAQUE  ? tretinoin (RETIN-A) 0.05 % cream Apply topically at bedtime.  ? valsartan (DIOVAN) 320 MG tablet TAKE 1 TABLET AT NIGHT FOR BLOOD PRESSURE  ? vitamin B-12 (CYANOCOBALAMIN) 1000 MCG tablet Take 1,000 mcg by mouth daily.  ? [DISCONTINUED] atenolol (TENORMIN) 25 MG tablet Take by mouth daily. Take 1/2 tablet by mouth daily  ?   ? ?Allergies:   Ciprofloxacin, Levaquin [levofloxacin in d5w], Penicillins, and Zoloft [sertraline hcl]  ? ?Social History  ? ?Socioeconomic History  ? Marital status: Married  ?  Spouse name: Not on file  ? Number of children: Not on file  ? Years of education: Not on file  ? Highest education level: Not on file  ?Occupational History  ? Not on file  ?Tobacco Use  ? Smoking status: Former  ?  Types: Cigarettes  ?  Quit date: 04/01/2004  ?  Years since quitting: 17.3  ? Smokeless tobacco: Never  ? Tobacco comments:  ?  Social smoker times 10 years  ?Vaping Use  ? Vaping Use: Never used  ?Substance and Sexual Activity  ? Alcohol use: Yes  ?  Alcohol/week: 0.0 standard drinks  ?  Comment: rarely, < 1 beer per week  ? Drug use: No  ? Sexual activity: Not on file  ?Other Topics Concern  ? Not on file  ?Social History Narrative  ? Pt lives in [place] with spouse.  Retired from [employer].  ? ?Social Determinants of Health  ? ?Financial Resource Strain: Not on file  ?Food Insecurity: Not on file  ?Transportation Needs: Not on file  ?Physical Activity: Not on file  ?Stress: Not on file  ?Social Connections: Not on file  ?  ? ?Family History: ?The patient's family history includes Alcohol abuse in her father; Diabetes in her mother; Heart attack in her father and mother; Heart disease in her mother; Macular degeneration in her mother; Ovarian cancer in her maternal grandmother; Stroke in her mother. There is no history of Colon cancer. ? ?ROS:   ?Please see the history of present illness.    ?+ palpitations ?All other systems reviewed and are negative. ? ?Labs/Other Studies Reviewed:   ? ?The following studies were reviewed today: ? ?Cardiac monitor 11/30/16 ? ?Sinus Rhythm  ?PAC;s ?Symptoms fluttering do not always correlate with arrhythmia  ? ?Lexiscan myoview 10/2016 ? ?The left ventricular ejection fraction is hyperdynamic (>65%). ?Nuclear stress EF: 68%. Overall normal wall motion ?There was no ST segment  deviation noted during stress. ?Defect 1: There is a small defect present in the apex location. This may be representative a small apical infarct or perhaps represents artifact. ?This is a low risk study. ? ?Recent Labs: ?06/30/2021: ALT 14; Magnesium 2.1; TSH 1.26 ?07/31/2021: BUN 17; Creatinine, Ser 0.76; Hemoglobin 13.3; Platelets 275; Potassium 3.5; Sodium 138  ?Recent Lipid Panel ?   ?Component Value Date/Time  ? CHOL 199 06/30/2021 1609  ? TRIG 160 (H) 06/30/2021 1609  ? HDL 58 06/30/2021 1609  ? CHOLHDL 3.4 06/30/2021 1609  ? VLDL 59 (H) 08/17/2016 1133  ? LDLCALC 113 (H) 06/30/2021 1609  ? ? ? ?Risk Assessment/Calculations:   ? ?CHA2DS2-VASc Score = 3  ?This indicates a 3.2% annual risk of stroke. ?The patient's  score is based upon: ?CHF History: 0 ?HTN History: 1 ?Diabetes History: 0 ?Stroke History: 0 ?Vascular Disease History: 0 ?Age Score: 1 ?Gender Score: 1 ?  ? ?Physical Exam:   ? ?VS:  BP 124/64   Pulse (!) 55   Ht '5\' 7"'$  (1.702 m)   Wt 230 lb 3.2 oz (104.4 kg)   SpO2 98%   BMI 36.05 kg/m?    ? ?Wt Readings from Last 3 Encounters:  ?08/04/21 230 lb 3.2 oz (104.4 kg)  ?07/31/21 225 lb (102.1 kg)  ?06/30/21 229 lb 6.4 oz (104.1 kg)  ?  ? ?GEN: Well developed, obese female in no acute distress ?HEENT: Normal ?NECK: No JVD; No carotid bruits ?CARDIAC: RRR, no murmurs, rubs, gallops ?RESPIRATORY:  Clear to auscultation without rales, wheezing or rhonchi  ?ABDOMEN: Soft, non-tender, non-distended ?MUSCULOSKELETAL:  Bilateral lower extremity edema; No deformity. 2+ pedal pulses, equal bilaterally ?SKIN: Warm and dry ?NEUROLOGIC:  Alert and oriented x 3 ?PSYCHIATRIC:  Normal affect  ? ?EKG:  EKG is ordered today.  The ekg ordered today demonstrates sinus bradycardia at 55 bpm, no ST/T wave abnormality ? ?Diagnoses:   ? ?1. Paroxysmal atrial fibrillation (HCC)   ?2. Palpitations   ?3. Bradycardia   ?4. Essential hypertension   ?5. Aortic atherosclerosis (Experiment) by CTA 04/2019   ?6. Aneurysm of descending  thoracic aorta without rupture (HCC)   ? ?Assessment and Plan:   ? ? ?Palpitations: Frequent palpitations over the past week.  PACs noted on monitor in the ED on 4/29. We discussed potential causes of palpitations. Her K+ is on th

## 2021-08-04 ENCOUNTER — Encounter: Payer: Self-pay | Admitting: Nurse Practitioner

## 2021-08-04 ENCOUNTER — Ambulatory Visit (INDEPENDENT_AMBULATORY_CARE_PROVIDER_SITE_OTHER): Payer: Medicare Other | Admitting: Nurse Practitioner

## 2021-08-04 VITALS — BP 124/64 | HR 55 | Ht 67.0 in | Wt 230.2 lb

## 2021-08-04 DIAGNOSIS — I10 Essential (primary) hypertension: Secondary | ICD-10-CM

## 2021-08-04 DIAGNOSIS — R002 Palpitations: Secondary | ICD-10-CM | POA: Diagnosis not present

## 2021-08-04 DIAGNOSIS — I480 Paroxysmal atrial fibrillation: Secondary | ICD-10-CM | POA: Diagnosis not present

## 2021-08-04 DIAGNOSIS — I700 Atherosclerosis of aorta: Secondary | ICD-10-CM | POA: Diagnosis not present

## 2021-08-04 DIAGNOSIS — R001 Bradycardia, unspecified: Secondary | ICD-10-CM | POA: Diagnosis not present

## 2021-08-04 DIAGNOSIS — I7123 Aneurysm of the descending thoracic aorta, without rupture: Secondary | ICD-10-CM | POA: Diagnosis not present

## 2021-08-04 MED ORDER — POTASSIUM CHLORIDE CRYS ER 10 MEQ PO TBCR: 10.0000 meq | EXTENDED_RELEASE_TABLET | Freq: Every day | ORAL | 3 refills | Status: DC

## 2021-08-04 MED ORDER — METOPROLOL SUCCINATE ER 25 MG PO TB24: 12.5000 mg | ORAL_TABLET | Freq: Every day | ORAL | 0 refills | Status: DC

## 2021-08-04 NOTE — Patient Instructions (Signed)
Medication Instructions:  ? ?DISCONTINUE Atenolol. ? ?START Toprol one half tablet by mouth (12.5 mg) daily ? ?START KDUR one (1) tablet by mouth (10 mEq) daily. ? ?*If you need a refill on your cardiac medications before your next appointment, please call your pharmacy* ? ? ?Lab Work: ? ?None ordered!!! ? ?If you have labs (blood work) drawn today and your tests are completely normal, you will receive your results only by: ?MyChart Message (if you have MyChart) OR ?A paper copy in the mail ?If you have any lab test that is abnormal or we need to change your treatment, we will call you to review the results. ? ? ?Testing/Procedures: ? ?None ordered!! ? ? ?Follow-Up: ?At [facility], you and your health needs are our priority.  As part of our continuing mission to provide you with exceptional heart care, we have created designated Provider Care Teams.  These Care Teams include your primary Cardiologist (physician) and Advanced Practice Providers (APPs -  Physician Assistants and Nurse Practitioners) who all work together to provide you with the care you need, when you need it. ? ?We recommend signing up for the patient portal called "MyChart".  Sign up information is provided on this After Visit Summary.  MyChart is used to connect with patients for Virtual Visits (Telemedicine).  Patients are able to view lab/test results, encounter notes, upcoming appointments, etc.  Non-urgent messages can be sent to your provider as well.   ?To learn more about what you can do with MyChart, go to [email or web address].   ? ?Your next appointment:   ?1 month(s) ? ?The format for your next appointment:   ?In Person ? ?Provider:   ?Christen Bame, NP       ? ?Important Information About Sugar ? ? ? ? ?  ?

## 2021-08-12 ENCOUNTER — Other Ambulatory Visit: Payer: Self-pay

## 2021-08-12 MED ORDER — METOPROLOL SUCCINATE ER 25 MG PO TB24: 12.5000 mg | ORAL_TABLET | Freq: Every day | ORAL | 3 refills | Status: DC

## 2021-08-12 NOTE — Telephone Encounter (Signed)
Pt's medication was sent to pt's pharmacy as requested. Confirmation received.  °

## 2021-08-17 ENCOUNTER — Other Ambulatory Visit: Payer: Self-pay | Admitting: Adult Health

## 2021-09-22 DIAGNOSIS — H2513 Age-related nuclear cataract, bilateral: Secondary | ICD-10-CM | POA: Diagnosis not present

## 2021-09-22 DIAGNOSIS — H43812 Vitreous degeneration, left eye: Secondary | ICD-10-CM | POA: Diagnosis not present

## 2021-09-22 DIAGNOSIS — H35373 Puckering of macula, bilateral: Secondary | ICD-10-CM | POA: Diagnosis not present

## 2021-09-22 DIAGNOSIS — H47323 Drusen of optic disc, bilateral: Secondary | ICD-10-CM | POA: Diagnosis not present

## 2021-09-24 ENCOUNTER — Ambulatory Visit: Payer: Medicare Other | Admitting: Nurse Practitioner

## 2021-10-26 DIAGNOSIS — S92514A Nondisplaced fracture of proximal phalanx of right lesser toe(s), initial encounter for closed fracture: Secondary | ICD-10-CM | POA: Diagnosis not present

## 2021-11-10 ENCOUNTER — Encounter: Payer: Self-pay | Admitting: Nurse Practitioner

## 2021-11-10 ENCOUNTER — Ambulatory Visit (INDEPENDENT_AMBULATORY_CARE_PROVIDER_SITE_OTHER): Payer: Medicare Other | Admitting: Nurse Practitioner

## 2021-11-10 VITALS — BP 122/72 | HR 61 | Temp 97.5°F | Ht 67.0 in | Wt 234.0 lb

## 2021-11-10 DIAGNOSIS — E782 Mixed hyperlipidemia: Secondary | ICD-10-CM | POA: Diagnosis not present

## 2021-11-10 DIAGNOSIS — R7309 Other abnormal glucose: Secondary | ICD-10-CM | POA: Diagnosis not present

## 2021-11-10 DIAGNOSIS — Z0001 Encounter for general adult medical examination with abnormal findings: Secondary | ICD-10-CM | POA: Diagnosis not present

## 2021-11-10 DIAGNOSIS — M816 Localized osteoporosis [Lequesne]: Secondary | ICD-10-CM | POA: Diagnosis not present

## 2021-11-10 DIAGNOSIS — R6889 Other general symptoms and signs: Secondary | ICD-10-CM

## 2021-11-10 DIAGNOSIS — J9859 Other diseases of mediastinum, not elsewhere classified: Secondary | ICD-10-CM

## 2021-11-10 DIAGNOSIS — I7123 Aneurysm of the descending thoracic aorta, without rupture: Secondary | ICD-10-CM | POA: Diagnosis not present

## 2021-11-10 DIAGNOSIS — E559 Vitamin D deficiency, unspecified: Secondary | ICD-10-CM

## 2021-11-10 DIAGNOSIS — Z0000 Encounter for general adult medical examination without abnormal findings: Secondary | ICD-10-CM

## 2021-11-10 DIAGNOSIS — I700 Atherosclerosis of aorta: Secondary | ICD-10-CM

## 2021-11-10 DIAGNOSIS — I10 Essential (primary) hypertension: Secondary | ICD-10-CM | POA: Diagnosis not present

## 2021-11-10 DIAGNOSIS — G4700 Insomnia, unspecified: Secondary | ICD-10-CM

## 2021-11-10 DIAGNOSIS — K589 Irritable bowel syndrome without diarrhea: Secondary | ICD-10-CM | POA: Diagnosis not present

## 2021-11-10 DIAGNOSIS — I480 Paroxysmal atrial fibrillation: Secondary | ICD-10-CM

## 2021-11-10 NOTE — Progress Notes (Signed)
WELCOME TO MEDICARE ANNUAL WELLNESS VISIT AND FOLLOW UP  Assessment:    Encounter for Medicare annual wellness exam Due Annually  Paroxysmal atrial fibrillation ([place]) Follows with cardio, on eliquis Continue to monitor  Thoracic aortic aneurysm without rupture ([place]) Follows with cardio, stable 04/2019 CTA Continue to monitor   Atherosclerosis of aorta Continue medications;  Discussed lifestyle modifications. Recommended diet heavy in fruits and veggies, omega 3's. Decrease consumption of animal meats, cheeses, and dairy products. Remain active and exercise as tolerated. Continue to monitor. Check lipids.   Obesity  Discussed appropriate BMI Goal of losing 1 lb per month. Diet modification. Physical activity. Encouraged/praised to build confidence.   Mixed hyperlipidemia Continue medications;  Discussed lifestyle modifications. Recommended diet heavy in fruits and veggies, omega 3's. Decrease consumption of animal meats, cheeses, and dairy products. Remain active and exercise as tolerated. Continue to monitor.   Medication management All medications discussed and reviewed in full. All questions and concerns regarding medications addressed.     Vitamin D deficiency Continue supplement Continue to monitor  Abnormal glucose A1C (<7) Blood pressure (<130/80) Cholesterol (LDL <70) Continue Eye Exam yearly  Continue Dental Exam Q6 mo Discussed dietary recommendations Discussed Physical Activity recommendations   Insomnia, unspecified type Discussed good sleep hygiene. Establish bed and wake times. Sleep restriction-only sleep estimated hrs sleep. Bed only for sex and sleep, only sleep when sleepy, out of bed if anxious (stimulus control). Reviewed relaxation techniques, mindful meditations. Expected sleep duration. Addressed worries about not sleeping.    Irritable bowel syndrome, unspecified type Controlled  Imodium and Hycosamine PRN Stay well  hydrated. She is up to date on colonoscopy Continue to monitor  Essential hypertension Controlled Continue medications;  Discussed DASH (Dietary Approaches to Stop Hypertension) DASH diet is lower in sodium than a typical American diet. Cut back on foods that are high in saturated fat, cholesterol, and trans fats. Eat more whole-grain foods, fish, poultry, and nuts Remain active and exercise as tolerated daily.  Monitor BP at home-Call if greater than 130/80.    Localized osteoporosis without current pathological fracture Pursue a combination of weight-bearing exercises and strength training. Advised on fall prevention measures including proper lighting in all rooms, removal of area rugs and floor clutter, use of walking devices as deemed appropriate, avoidance of uneven walking surfaces. Smoking cessation and moderate alcohol consumption if applicable Consume 854 to 1000 IU of vitamin D daily with a goal vitamin D serum value of 30 ng/mL or higher. Aim for 1000 to 1200 mg of elemental calcium daily through supplements and/or dietary sources.   No orders of the defined types were placed in this encounter.  Over 40 minutes of exam, counseling, chart review and critical decision making was performed Future Appointments  Date Time Provider [place]  02/16/2022  2:00 PM Unk Pinto, MD [facility] None  07/06/2022  2:00 PM Unk Pinto, MD [facility] None  11/11/2022  3:00 PM Darrol Jump, NP [facility] None     Plan:   During the course of the visit the patient was educated and counseled about appropriate screening and preventive services including:   Pneumococcal vaccine  Prevnar 13 Influenza vaccine Td vaccine Screening electrocardiogram Bone densitometry screening Colorectal cancer screening Diabetes screening Glaucoma screening Nutrition counseling  Advanced directives: requested   Subjective:  Claudia [name] is a 71 y.o. female who presents  for Medicare Annual Wellness Visit and 3 month follow up.   Patient reports that overall she is doing well.  She has  no additional concerns in clinic today.   Her blood pressure has been controlled at home, today their BP is BP: 122/72 She does not workout, 3 days a week, does seated. She denies chest pain, shortness of breath, dizziness.   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 [name].  On elequis and atenolol for a. Fib.  She has osteoporosis, started on fosamax x 01/2018. Stopped due to progress in DEXA scan screening.    BMI is Body mass index is 36.65 kg/m., she is working on diet and exercise.  She has joined [employer], her and her husband, has been maintaining, trying to lose another 20 lb, needs to watch portions. Admits not exercising -  Wt Readings from Last 3 Encounters:  11/10/21 234 lb (106.1 kg)  08/04/21 230 lb 3.2 oz (104.4 kg)  07/31/21 225 lb (102.1 kg)   She is not on cholesterol medication. Her cholesterol is not at goal. However she would like to start cholesterol medication due to aortic plaque. The cholesterol last visit was:   Lab Results  Component Value Date   CHOL 199 06/30/2021   HDL 58 06/30/2021   LDLCALC 113 (H) 06/30/2021   TRIG 160 (H) 06/30/2021   CHOLHDL 3.4 06/30/2021   She has been working on diet and exercise for glucose management, and denies paresthesia of the feet, polydipsia and polyuria. Last A1C in the office was:  Lab Results  Component Value Date   HGBA1C 5.5 06/30/2021   Last GFR: Lab Results  Component Value Date   GFRNONAA >60 07/31/2021   Patient is on Vitamin D supplement.   Lab Results  Component Value Date   VD25OH 76 06/30/2021       Medication Review:   Current Outpatient Medications (Cardiovascular):    hydrochlorothiazide (HYDRODIURIL) 25 MG tablet, TAKE 1 TABLET DAILY FOR BLOOD PRESSURE AND FLUID  RETENTION, ANKLE SWELLING   metoprolol succinate (TOPROL XL) 25 MG 24 hr tablet, Take 0.5 tablets (12.5 mg total) by mouth daily.   pravastatin (PRAVACHOL) 20 MG tablet, TAKE 1 TABLET EVERY OTHER DAY IN THE EVENING FOR CHOLESTEROL AND PLAQUE   valsartan (DIOVAN) 320 MG tablet, TAKE 1 TABLET AT NIGHT FOR BLOOD PRESSURE  Current Outpatient Medications (Respiratory):    DiphenhydrAMINE HCl (BENADRYL ALLERGY PO), Take by mouth as needed (allergies).    Current Outpatient Medications (Hematological):    apixaban (ELIQUIS) 5 MG TABS tablet, TAKE 1 TABLET TWICE DAILY (PLEASE KEEP UPCOMING APPOINTMENT, PENDING APPOINTMENT IN JANUARY 2023, WILL EVALUATE FOR REFILLS AT THAT TIME)   vitamin B-12 (CYANOCOBALAMIN) 1000 MCG tablet, Take 1,000 mcg by mouth daily.  Current Outpatient Medications (Other):    Ascorbic Acid (VITAMIN C PO), Take by mouth 2 (two) times daily.   Cholecalciferol (VITAMIN D-3 PO), Take 8,000 Units by mouth daily.   CINNAMON PO, Take 1,000 mg by mouth daily.    diclofenac Sodium (VOLTAREN) 1 % GEL, Apply 4 g topically 4 (four) times daily as needed.   loperamide (IMODIUM) 2 MG capsule, Take 4 mg by mouth daily.    tretinoin (RETIN-A) 0.05 % cream, Apply topically at bedtime.   potassium chloride (KLOR-CON M) 10 MEQ tablet, Take 1 tablet (10 mEq total) by mouth daily. (Patient not taking: Reported on 11/10/2021)  Allergies Allergies  Allergen Reactions   Ciprofloxacin    Levaquin [Levofloxacin In D5w]     No fluroquinolones due to ascending thoracic  aortic aneurysm   Penicillins Other (See Comments)    Had as a child=unknown reaction   Zoloft [Sertraline Hcl]     Current Problems (verified) Patient Active Problem List   Diagnosis Date Noted   OAB (overactive bladder) 12/18/2019   Melasma 12/17/2019   Aortic atherosclerosis ([place]) by CTA 04/2019 06/05/2019   Osteoporosis 05/08/2018   DJD (degenerative joint disease) 05/08/2018   Thoracic aortic aneurysm ([place]) 08/13/2016    Morbid obesity ([place]) - BMI 35+ with htn, hld 03/16/2015   Atrial fibrillation ([place]) 10/29/2013   Medication management 02/18/2013   Hypertension    Hyperlipidemia    Abnormal glucose    Vitamin D deficiency    IBS (irritable bowel syndrome)    Insomnia     Screening Tests Health Maintenance  Topic Date Due   Zoster Vaccines- Shingrix (1 of 2) Never done   COVID-19 Vaccine (5 - Booster for Pfizer series) 10/21/2020   INFLUENZA VACCINE  11/03/2021   COLONOSCOPY (Pts 45-33yr Insurance coverage will need to be confirmed)  11/04/2022   MAMMOGRAM  05/06/2023   DEXA SCAN  05/09/2023   TETANUS/TDAP  10/26/2026   Pneumonia Vaccine 71 Years old  Completed   Hepatitis C Screening  Completed   HPV VACCINES  Aged Out   Immunization History  Administered Date(s) Administered   Fluad Quad(high Dose 65+) 12/15/2018, 01/22/2020   Influenza Split 02/19/2013   Influenza, High Dose Seasonal PF 01/25/2018   Influenza, Seasonal, Injecte, Preservative Fre 03/12/2015   Influenza,inj,Quad PF,6+ Mos 01/27/2017   Influenza-Unspecified 04/06/2011   PFIZER(Purple Top)SARS-COV-2 Vaccination 04/27/2019, 05/18/2019, 12/14/2019, 08/26/2020   PPD Test 10/15/2013   Pneumococcal Conjugate-13 01/25/2018   Pneumococcal Polysaccharide-23 09/13/2019   Pneumococcal-Unspecified 04/06/1995   Td 10/25/2016   Tdap 04/05/2006   Zoster, Live 07/04/2013   Preventative care: Last colonoscopy: 2014 10 year recall.  Due on 11/04/2022 Last mammogram: 05/05/21 Due 04/2022 Last pap smear/pelvic exam: 08/2019 at GYN, normal, DONE DEXA: 01/2019, R fem T -1.7, on fosamax since 01/2018, will stop 01/2020, repeat dexa 01/2021 05/08/2021 -1.8 Stress test 2018 Dr. NJohnsie Cancel low risk study EF 86% Echo 2015  Prior vaccinations: TD or Tdap: 2018  Influenza: 12/2020 Pneumococcal: 1997 Prevnar13: 2019  Shingles/Zostavax: 2015  Covid 19: 2/2, 2021, pfizer  Names of Other Physician/Practitioners you currently use: 1. [facility] here for primary care 2. Dr. [place]yrian [place] eye doctor, last visit 2021 3. [facility], Dr. RDayna Barker dentist, last visit 2020   Patient Care Team: MUnk Pinto MD as PCP - General (Internal Medicine) NJosue Hector MD as PCP - Cardiology (Cardiology) NJosue Hector MD as Consulting Physician (Cardiology) OSyrian [place] Heather, [place](Optometry)  SURGICAL HISTORY She  has a past surgical history that includes Cesarean section; Cholecystectomy (10 years ago); Tubal ligation (Bilateral); and Cesarean section. FAMILY HISTORY Her family history includes Alcohol abuse in her father; Diabetes in her mother; Heart attack in her father and mother; Heart disease in her mother; Macular degeneration in her mother; Ovarian cancer in her maternal grandmother; Stroke in her mother. SOCIAL HISTORY She  reports that she quit smoking about 17 years ago. Her smoking use included cigarettes. She has never used smokeless tobacco. She reports current alcohol use. She reports that she does not use drugs.   MEDICARE WELLNESS OBJECTIVES: Physical activity:   Cardiac risk factors:   Depression/mood screen:      11/10/2021   10:11 PM  Depression screen PHQ 2/9  Decreased Interest 0  Down,  Depressed, Hopeless 0  PHQ - 2 Score 0    ADLs:     11/10/2021    3:48 PM 06/30/2021   12:22 AM  In your present state of health, do you have any difficulty performing the following activities:  Hearing? 0 0  Vision? 0 0  Difficulty concentrating or making decisions? 0 0  Walking or climbing stairs? 0 0  Dressing or bathing? 0 0  Doing errands, shopping? 0 0  Preparing Food and eating ? N   Using the Toilet? N   In the past six months, have you accidently leaked urine? N   Do you have problems with loss of bowel control? N   Managing your Medications? N   Managing your Finances? N   Housekeeping or managing your Housekeeping? N      Cognitive Testing  Alert? Yes  Normal  Appearance?Yes  Oriented to person? Yes  Place? Yes   Time? Yes  Recall of three objects?  Yes  Can perform simple calculations? Yes  Displays appropriate judgment?Yes  Can read the correct time from a watch face?Yes  EOL planning: Does Patient Have a Medical Advance Directive?: No Would patient like information on creating a medical advance directive?: No - Patient declined  Review of Systems  Constitutional:  Negative for malaise/fatigue and weight loss.  HENT:  Negative for hearing loss and tinnitus.   Eyes:  Negative for blurred vision and double vision.  Respiratory:  Negative for cough, sputum production, shortness of breath and wheezing.   Cardiovascular:  Negative for chest pain, palpitations, orthopnea, claudication, leg swelling and PND.  Gastrointestinal:  Negative for abdominal pain, blood in stool, constipation, diarrhea, heartburn, melena, nausea and vomiting.  Genitourinary: Negative.   Musculoskeletal:  Negative for falls, joint pain and myalgias.  Skin:  Negative for rash.  Neurological:  Negative for dizziness, tingling, sensory change, weakness and headaches.  Endo/Heme/Allergies:  Negative for polydipsia.  Psychiatric/Behavioral: Negative.  Negative for depression, memory loss, substance abuse and suicidal ideas. The patient is not nervous/anxious and does not have insomnia.   All other systems reviewed and are negative.    Objective:     Today's Vitals   11/10/21 1523  BP: 122/72  Pulse: 61  Temp: (!) 97.5 F (36.4 C)  SpO2: 93%  Weight: 234 lb (106.1 kg)  Height: '5\' 7"'$  (1.702 m)   Body mass index is 36.65 kg/m.  General appearance: alert, no distress, WD/WN, female HEENT: normocephalic, sclerae anicteric, TMs pearly, nares patent, no discharge or erythema, pharynx normal Oral cavity: MMM, no lesions Neck: supple, no lymphadenopathy, no thyromegaly, no masses Heart: RRR, normal S1, S2, no murmurs Lungs: CTA bilaterally, no wheezes, rhonchi, or  rales Abdomen: +bs, soft, non tender, non distended, no masses, no hepatomegaly, no splenomegaly Musculoskeletal: nontender, no swelling, no obvious deformity Extremities: no edema, no cyanosis, no clubbing Pulses: 2+ symmetric, upper and lower extremities, normal cap refill Neurological: alert, oriented x 3, CN2-12 intact, strength normal upper extremities and lower extremities, sensation normal throughout, DTRs 2+ throughout, no cerebellar signs, gait normal Psychiatric: normal affect, behavior normal, pleasant   Medicare Attestation I have personally reviewed: The patient's medical and social history Their use of alcohol, tobacco or illicit drugs Their current medications and supplements The patient's functional ability including ADLs,fall risks, home safety risks, cognitive, and hearing and visual impairment Diet and physical activities Evidence for depression or mood disorders  The patient's weight, height, BMI, and visual acuity have been recorded in the  chart.  I have made referrals, counseling, and provided education to the patient based on review of the above and I have provided the patient with a written personalized care plan for preventive services.     Darrol Jump, NP   11/10/2021

## 2021-11-26 ENCOUNTER — Other Ambulatory Visit: Payer: Self-pay

## 2021-11-26 DIAGNOSIS — I10 Essential (primary) hypertension: Secondary | ICD-10-CM

## 2021-11-26 MED ORDER — VALSARTAN 320 MG PO TABS: ORAL_TABLET | ORAL | 3 refills | Status: DC

## 2021-12-03 ENCOUNTER — Other Ambulatory Visit: Payer: Self-pay | Admitting: Cardiovascular Disease

## 2021-12-03 DIAGNOSIS — I480 Paroxysmal atrial fibrillation: Secondary | ICD-10-CM

## 2021-12-03 NOTE — Telephone Encounter (Signed)
Prescription refill request for Eliquis received. Indication: PAF Last office visit: 08/04/21  Elwyn Reach NP Scr: 0.76 on 07/31/21 Age: 71 Weight: 104.4kg  Based on above findings Eliquis '5mg'$  twice daily is the appropriate dose.  Refill approved.

## 2021-12-08 DIAGNOSIS — M170 Bilateral primary osteoarthritis of knee: Secondary | ICD-10-CM | POA: Diagnosis not present

## 2021-12-08 DIAGNOSIS — M25561 Pain in right knee: Secondary | ICD-10-CM | POA: Diagnosis not present

## 2021-12-08 DIAGNOSIS — M25562 Pain in left knee: Secondary | ICD-10-CM | POA: Diagnosis not present

## 2021-12-23 DIAGNOSIS — M170 Bilateral primary osteoarthritis of knee: Secondary | ICD-10-CM | POA: Diagnosis not present

## 2021-12-23 DIAGNOSIS — M1712 Unilateral primary osteoarthritis, left knee: Secondary | ICD-10-CM | POA: Diagnosis not present

## 2021-12-23 DIAGNOSIS — M1711 Unilateral primary osteoarthritis, right knee: Secondary | ICD-10-CM | POA: Diagnosis not present

## 2021-12-29 ENCOUNTER — Telehealth: Payer: Self-pay | Admitting: Physician Assistant

## 2021-12-29 NOTE — Telephone Encounter (Signed)
Pt c/o Shortness Of Breath: STAT if SOB developed within the last 24 hours or pt is noticeably SOB on the phone  1. Are you currently SOB (can you hear that pt is SOB on the phone)? No   2. How long have you been experiencing SOB? week  3. Are you SOB when sitting or when up moving around? Both   4. Are you currently experiencing any other symptoms? One sided weakness, lightheadedness

## 2021-12-29 NOTE — Telephone Encounter (Signed)
Spoke with the patient who reports that she has been having shortness of breath at rest. She states that she becomes fatigued when this occurs and has some weakness in her arms. She reports occasional lightheadedness as well. She denies any chest pain. She does not keep up with her blood pressure or heart rates. She states that she has had similar symptoms sporadically in the past for a long time. She states that over the past week it has happened more frequently. Almost daily and lasts for about 30 minutes to an hour. She cannot come in tomorrow or Thursday for an appointment. She has been scheduled for Friday 9/29.

## 2021-12-30 DIAGNOSIS — M1712 Unilateral primary osteoarthritis, left knee: Secondary | ICD-10-CM | POA: Diagnosis not present

## 2021-12-30 DIAGNOSIS — M170 Bilateral primary osteoarthritis of knee: Secondary | ICD-10-CM | POA: Diagnosis not present

## 2021-12-30 DIAGNOSIS — M1711 Unilateral primary osteoarthritis, right knee: Secondary | ICD-10-CM | POA: Diagnosis not present

## 2021-12-30 NOTE — Progress Notes (Signed)
Office Visit    Patient Name: Claudia [name] Date of Encounter: 01/01/2022  PCP:  Unk Pinto, [facility]  Cardiologist:  Jenkins Rouge, MD  Advanced Practice Provider:  No care team member to display Electrophysiologist:  None   HPI    Claudia [name] is a 71 y.o. female with past medical history significant for hypertension, PAF, diabetes, mild aortic root dilatation by CT scan, palpitations, PACs, bradycardia, obesity, and snoring presents today for follow-up appointment for SOB, weakness, lightheadedness.  Patient's history includes chronic anticoagulation with Eliquis for history of PAF and CHA2DS2-VASc score of 3.  History of bradycardia on low-dose beta-blocker.  Suspected to have OSA but no sleep study done.  Normal LVEF, no significant valvular disease by echocardiogram 2015.  Low risk Myoview 10/2016.  She was in the ED 08/01/2021 with concerns for palpitations.  Cardiac telemetry and EKG revealed PACs, no atrial fibrillation.  Symptoms were not concerning for cardiac ischemia and she was advised to follow-up with cardiology.  She was seen by Stephan Minister, NP 08/2021.  She stated that her heart beat faster with activity, however she did not notice worsening palpitations.  She is active at home but does not do any routine exercise.  She drinks 316 ounce cups of decaf coffee a day and about 32 ounces of water.  She does notice leg cramps on occasion which she thought were linked to dehydration.  She has been trying to split a very small 25 mg atenolol tablet and admitted to inconsistent dosing.  At that appointment, echocardiogram and cardiac monitoring were discussed however I do not see where either of these were done.  Repeat BMP.  Today, she states when she is doing nothing she has some weakness, fatigue, lightheadedness, and sometimes shortness of breath.  Is not associated with activity.  Sometimes, her arms feel weak.  She states it  will happen for several days in a row but it does not happen the last 3 days or so.  She never ended up getting her echocardiogram that was previously ordered.  Blood pressure was elevated initially at 164/84 but on repeat was 140/80.  She has been trying to cut calories for weight loss and thinks that maybe some of her symptoms could be related to hypoglycemia.  She was borderline diabetic at one point but recent A1c was 5.5.  We discussed placing a referral to healthy weight and wellness center.  Reports no shortness of breath nor dyspnea on exertion. Reports no chest pain, pressure, or tightness. No edema, orthopnea, PND. Reports no palpitations.    Past Medical History    Past Medical History:  Diagnosis Date   Achilles tendonitis    R heal, limits activity   Allergy    Anxiety    Elevated hemoglobin A1c    Hyperlipidemia    Hypertension    IBS (irritable bowel syndrome)    Insomnia    Obesity    Palpitations    SVT (supraventricular tachycardia) ([place]) 10/29/2013   Vitamin D deficiency    Past Surgical History:  Procedure Laterality Date   CESAREAN SECTION     CESAREAN SECTION     CHOLECYSTECTOMY  10 years ago   TUBAL LIGATION Bilateral     Allergies  Allergies  Allergen Reactions   Ciprofloxacin    Levaquin [Levofloxacin In D5w]     No fluroquinolones due to ascending thoracic aortic aneurysm   Penicillins Other (See Comments)  Had as a child=unknown reaction   Zoloft [Sertraline Hcl]      EKGs/Labs/Other Studies Reviewed:   The following studies were reviewed today: Cardiac monitor 11/30/16   Sinus Rhythm  PAC;s Symptoms fluttering do not always correlate with arrhythmia    Lexiscan myoview 10/2016   The left ventricular ejection fraction is hyperdynamic (>65%). Nuclear stress EF: 68%. Overall normal wall motion There was no ST segment deviation noted during stress. Defect 1: There is a small defect present in the apex location. This may be  representative a small apical infarct or perhaps represents artifact. This is a low risk study.  EKG:  EKG is not ordered today.   Recent Labs: 06/30/2021: ALT 14; Magnesium 2.1; TSH 1.26 07/31/2021: BUN 17; Creatinine, Ser 0.76; Hemoglobin 13.3; Platelets 275; Potassium 3.5; Sodium 138  Recent Lipid Panel    Component Value Date/Time   CHOL 199 06/30/2021 1609   TRIG 160 (H) 06/30/2021 1609   HDL 58 06/30/2021 1609   CHOLHDL 3.4 06/30/2021 1609   VLDL 59 (H) 08/17/2016 1133   LDLCALC 113 (H) 06/30/2021 1609   Home Medications   Current Meds  Medication Sig   apixaban (ELIQUIS) 5 MG TABS tablet Take 1 tablet (5 mg total) by mouth 2 (two) times daily.   Ascorbic Acid (VITAMIN C PO) Take by mouth 2 (two) times daily.   Cholecalciferol (VITAMIN D-3 PO) Take 8,000 Units by mouth daily.   CINNAMON PO Take 1,000 mg by mouth daily.    diclofenac Sodium (VOLTAREN) 1 % GEL Apply 4 g topically 4 (four) times daily as needed.   DiphenhydrAMINE HCl (BENADRYL ALLERGY PO) Take by mouth as needed (allergies).    hydrochlorothiazide (HYDRODIURIL) 25 MG tablet TAKE 1 TABLET DAILY FOR BLOOD PRESSURE AND FLUID RETENTION, ANKLE SWELLING   loperamide (IMODIUM) 2 MG capsule Take 4 mg by mouth daily.    metoprolol succinate (TOPROL XL) 25 MG 24 hr tablet Take 0.5 tablets (12.5 mg total) by mouth daily.   pravastatin (PRAVACHOL) 20 MG tablet TAKE 1 TABLET EVERY OTHER DAY IN THE EVENING FOR CHOLESTEROL AND PLAQUE   tretinoin (RETIN-A) 0.05 % cream Apply topically at bedtime.   valsartan (DIOVAN) 320 MG tablet TAKE 1 TABLET AT NIGHT FOR BLOOD PRESSURE   vitamin B-12 (CYANOCOBALAMIN) 1000 MCG tablet Take 1,000 mcg by mouth daily.   [DISCONTINUED] potassium chloride (KLOR-CON M) 10 MEQ tablet Take 1 tablet (10 mEq total) by mouth daily.     Review of Systems      All other systems reviewed and are otherwise negative except as noted above.  Physical Exam    VS:  BP (!) 140/80   Pulse 60   Ht '5\' 8"'$   (1.727 m)   Wt 240 lb (108.9 kg)   BMI 36.49 kg/m  , BMI Body mass index is 36.49 kg/m.  Wt Readings from Last 3 Encounters:  01/01/22 240 lb (108.9 kg)  11/10/21 234 lb (106.1 kg)  08/04/21 230 lb 3.2 oz (104.4 kg)     GEN: Well nourished, well developed, in no acute distress. HEENT: normal. Neck: Supple, no JVD, carotid bruits, or masses. Cardiac: RRR, no murmurs, rubs, or gallops. No clubbing, cyanosis, edema.  Radials/PT 2+ and equal bilaterally.  Respiratory:  Respirations regular and unlabored, clear to auscultation bilaterally. GI: Soft, nontender, nondistended. MS: No deformity or atrophy. Skin: Warm and dry, no rash. Neuro:  Strength and sensation are intact. Psych: Normal affect.  Assessment & Plan    Fatigue,  shortness of breath, lightheadedness -We have ordered an echocardiogram today for further evaluation -Continue current medications -If echocardiogram has changed significantly we will consider an ischemic work-up -BMP today -Healthy weight and wellness referral  Palpitations -Well-controlled on metoprolol succinate 12.5 mg daily -Would not increase at this time since resting heart rate is 60 bpm  PAF on chronic anticoagulation -Continue Eliquis 5 mg twice daily -No issues with bleeding -Asymptomatic at this time  Sinus bradycardia -60 bpm on exam  Hypertension -Initially blood pressure 164/84, repeat 140/80 -Continue Diovan 320 mg daily, HCTZ 25 mg daily, and metoprolol succinate 12.5 mg twice daily -Monitor blood pressure at home an hour after medications.  She is going to switch her Diovan to the morning along with her HCTZ -Please record these values in notebook and send them to me via MyChart or phone call.  Mild aortic dilatation -We will see if this can be identified on upcoming echocardiogram  Aortic atherosclerosis -Continue pravastatin  HYPERTENSION CONTROL Vitals:   01/01/22 1044 01/01/22 1223  BP: (!) 164/84 (!) 140/80    The  patient's blood pressure is elevated above target today.  In order to address the patient's elevated BP: Blood pressure will be monitored at home to determine if medication changes need to be made.       Disposition: Follow up 4 months with Jenkins Rouge, MD or APP.  Signed, Elgie Collard, PA-C 01/01/2022, 12:31 PM [facility]

## 2022-01-01 ENCOUNTER — Encounter: Payer: Self-pay | Admitting: Physician Assistant

## 2022-01-01 ENCOUNTER — Ambulatory Visit: Payer: Medicare Other | Attending: Physician Assistant | Admitting: Physician Assistant

## 2022-01-01 VITALS — BP 140/80 | HR 60 | Ht 68.0 in | Wt 240.0 lb

## 2022-01-01 DIAGNOSIS — R0609 Other forms of dyspnea: Secondary | ICD-10-CM | POA: Insufficient documentation

## 2022-01-01 DIAGNOSIS — R002 Palpitations: Secondary | ICD-10-CM | POA: Insufficient documentation

## 2022-01-01 DIAGNOSIS — R42 Dizziness and giddiness: Secondary | ICD-10-CM | POA: Insufficient documentation

## 2022-01-01 DIAGNOSIS — R5383 Other fatigue: Secondary | ICD-10-CM | POA: Insufficient documentation

## 2022-01-01 DIAGNOSIS — I700 Atherosclerosis of aorta: Secondary | ICD-10-CM | POA: Diagnosis not present

## 2022-01-01 DIAGNOSIS — R001 Bradycardia, unspecified: Secondary | ICD-10-CM | POA: Diagnosis not present

## 2022-01-01 DIAGNOSIS — Z5181 Encounter for therapeutic drug level monitoring: Secondary | ICD-10-CM | POA: Diagnosis not present

## 2022-01-01 DIAGNOSIS — I480 Paroxysmal atrial fibrillation: Secondary | ICD-10-CM | POA: Diagnosis not present

## 2022-01-01 DIAGNOSIS — Z7901 Long term (current) use of anticoagulants: Secondary | ICD-10-CM | POA: Insufficient documentation

## 2022-01-01 DIAGNOSIS — R0602 Shortness of breath: Secondary | ICD-10-CM | POA: Diagnosis not present

## 2022-01-01 DIAGNOSIS — I10 Essential (primary) hypertension: Secondary | ICD-10-CM | POA: Diagnosis not present

## 2022-01-01 LAB — BASIC METABOLIC PANEL
BUN/Creatinine Ratio: 17 (ref 12–28)
BUN: 14 mg/dL (ref 8–27)
CO2: 27 mmol/L (ref 20–29)
Calcium: 10 mg/dL (ref 8.7–10.3)
Chloride: 98 mmol/L (ref 96–106)
Creatinine, Ser: 0.84 mg/dL (ref 0.57–1.00)
Glucose: 102 mg/dL — ABNORMAL HIGH (ref 70–99)
Potassium: 4.4 mmol/L (ref 3.5–5.2)
Sodium: 139 mmol/L (ref 134–144)
eGFR: 75 mL/min/{1.73_m2} (ref >59)

## 2022-01-01 NOTE — Patient Instructions (Signed)
Medication Instructions:  Your physician recommends that you continue on your current medications as directed. Please refer to the Current Medication list given to you today.  *If you need a refill on your cardiac medications before your next appointment, please call your pharmacy*   Lab Work: BMP today If you have labs (blood work) drawn today and your tests are completely normal, you will receive your results only by: [place] (if you have MyChart) OR A paper copy in the mail If you have any lab test that is abnormal or we need to change your treatment, we will call you to review the results.   Testing/Procedures: Your physician has requested that you have an echocardiogram. Echocardiography is a painless test that uses sound waves to create images of your heart. It provides your doctor with information about the size and shape of your heart and how well your heart's chambers and valves are working. This procedure takes approximately one hour. There are no restrictions for this procedure.    Follow-Up: At [facility], you and your health needs are our priority.  As part of our continuing mission to provide you with exceptional heart care, we have created designated Provider Care Teams.  These Care Teams include your primary Cardiologist (physician) and Advanced Practice Providers (APPs -  Physician Assistants and Nurse Practitioners) who all work together to provide you with the care you need, when you need it.  Your next appointment:   January 2024  The format for your next appointment:   In Person  Provider:   Jenkins Rouge, MD    Other Instructions 1.You have been referred to healthy weight and wellness 2.Keep a log of your blood pressures and send [place] the readings via MyChart or call us. Check it one hour after taking your morning medications.   Important Information About Sugar

## 2022-01-06 DIAGNOSIS — M170 Bilateral primary osteoarthritis of knee: Secondary | ICD-10-CM | POA: Diagnosis not present

## 2022-01-06 DIAGNOSIS — M1711 Unilateral primary osteoarthritis, right knee: Secondary | ICD-10-CM | POA: Diagnosis not present

## 2022-01-06 DIAGNOSIS — M1712 Unilateral primary osteoarthritis, left knee: Secondary | ICD-10-CM | POA: Diagnosis not present

## 2022-01-12 ENCOUNTER — Ambulatory Visit: Payer: Medicare Other | Admitting: Physician Assistant

## 2022-01-20 ENCOUNTER — Other Ambulatory Visit (HOSPITAL_COMMUNITY): Payer: Medicare Other

## 2022-01-29 ENCOUNTER — Ambulatory Visit (HOSPITAL_COMMUNITY): Payer: Medicare Other | Attending: Physician Assistant

## 2022-01-29 DIAGNOSIS — R0609 Other forms of dyspnea: Secondary | ICD-10-CM | POA: Diagnosis not present

## 2022-01-29 DIAGNOSIS — R5383 Other fatigue: Secondary | ICD-10-CM

## 2022-01-29 LAB — ECHOCARDIOGRAM COMPLETE
Area-P 1/2: 4.68 cm2
S' Lateral: 2.6 cm

## 2022-02-01 ENCOUNTER — Encounter: Payer: Self-pay | Admitting: Internal Medicine

## 2022-02-01 ENCOUNTER — Other Ambulatory Visit: Payer: Self-pay | Admitting: Internal Medicine

## 2022-02-01 MED ORDER — AZITHROMYCIN 250 MG PO TABS: ORAL_TABLET | ORAL | 0 refills | Status: DC

## 2022-02-02 ENCOUNTER — Encounter (INDEPENDENT_AMBULATORY_CARE_PROVIDER_SITE_OTHER): Payer: Self-pay

## 2022-02-02 ENCOUNTER — Other Ambulatory Visit: Payer: Self-pay | Admitting: Nurse Practitioner

## 2022-02-15 NOTE — Progress Notes (Unsigned)
Future Appointments  Date Time Provider Department  02/16/2022  2:30 PM Unk Pinto, MD [facility]  05/05/2022 10:30 AM Josue Hector, MD [facility]  07/06/2022  2:00 PM Unk Pinto, MD [facility]  11/11/2022  3:00 PM Darrol Jump, NP [facility]    History of Present Illness:       This very nice 71 y.o. MWF  presents for 3 month follow up with HTN, pAfib, HLD, Pre-Diabetes and Vitamin D Deficiency.  CTA in 2021 showed Aortic Atherosclerosis .       Patient is treated for HTN  since  1996 & BP has been controlled at home. Today's  .  In 2004, she was started on Eliquis for pAfib .   Patient has had no complaints of any cardiac type chest pain, palpitations, dyspnea / orthopnea / PND, dizziness, claudication, or dependent edema.       Hyperlipidemia is not controlled with diet & Pravastatin. Patient denies myalgias or other med SE's. Last Lipids were  Lab Results  Component Value Date   CHOL 199 06/30/2021   HDL 58 06/30/2021   LDLCALC 113 (H) 06/30/2021   TRIG 160 (H) 06/30/2021   CHOLHDL 3.4 06/30/2021     Also, the patient has history of PreDiabetes (A1c 5.8% /2012) and has had no symptoms of reactive hypoglycemia, diabetic polys, paresthesias or visual blurring.  Last A1c was normal :  Lab Results  Component Value Date   HGBA1C 5.5 06/30/2021                                                          Further, the patient also has history of Vitamin D Deficiency ("18" /2008) & supplements vitamin D . Last vitamin D was at goal :   Lab Results  Component Value Date   VD25OH 76 06/30/2021     Current Outpatient Medications on File Prior to Visit  Medication Sig   apixaban (ELIQUIS) 5 MG TABS tablet Take 1 tablet  2  times daily.   VITAMIN C  Take 2 times daily.   VITAMIN D   8,000 Units  Take  by  daily.   CINNAMON    1,000 mg Take  daily.    diclofenac  1 % GEL Apply 4 g topically 4  times daily as needed.   DiphenhydrAMINE Take  as needed (a    Hctz  25 MG tablet TAKE 1 TABLET DAILY    loperamide (IMODIUM) 2 MG  Take 4 mg b daily.    metoprolol succinate  XL 25 MG  daily.   pravastatin  20 MG tablet TAKE 1 TABLET EVERY OTHER DAY   tretinoin (RETIN-A) 0.05 % cream Apply topically at bedtime.   valsartan (DIOVAN) 320 MG tablet TAKE 1 TABLET AT NIGHT    vitamin B-12  1000 MCG tablet Take   daily.      Allergies  Allergen Reactions   Ciprofloxacin    Levaquin [Levofloxacin In D5w]     No fluroquinolones due to ascending thoracic aortic aneurysm   Penicillins Other (See Comments)    Had as a child=unknown reaction   Zoloft [Sertraline Hcl]      PMHx:   Past Medical History:  Diagnosis Date   Achilles tendonitis    R heal, limits activity   Allergy  Anxiety    Elevated hemoglobin A1c    Hyperlipidemia    Hypertension    IBS (irritable bowel syndrome)    Insomnia    Obesity    Palpitations    SVT (supraventricular tachycardia) ([place]) 10/29/2013   Vitamin D deficiency      Immunization History  Administered Date(s) Administered   Fluad Quad(high Dose) 12/15/2018, 01/22/2020   Influenza Split 02/19/2013   Influenza, High Dose  01/25/2018   Influenza, Seasonal 03/12/2015   Influenza,inj,Quad  01/27/2017   Influenza 04/06/2011   PFIZER-SARS-COV-2 Vacc 04/27/2019, 05/18/2019, 12/14/2019, 08/26/2020   PPD Test 10/15/2013   Pneumococcal - 13 01/25/2018   Pneumococcal - 23 09/13/2019   Pneumococcal - 23 04/06/1995   Td 10/25/2016   Tdap 04/05/2006   Zoster, Live 07/04/2013     Past Surgical History:  Procedure Laterality Date   CESAREAN SECTION     CESAREAN SECTION     CHOLECYSTECTOMY  10 years ago   TUBAL LIGATION Bilateral     FHx:    Reviewed / unchanged  SHx:    Reviewed / unchanged   Systems Review:  Constitutional: Denies fever, chills, wt changes, headaches, insomnia, fatigue, night sweats, change in appetite. Eyes: Denies redness, blurred vision, diplopia, discharge, itchy, watery eyes.   ENT: Denies discharge, congestion, post nasal drip, epistaxis, sore throat, earache, hearing loss, dental pain, tinnitus, vertigo, sinus pain, snoring.  CV: Denies chest pain, palpitations, irregular heartbeat, syncope, dyspnea, diaphoresis, orthopnea, PND, claudication or edema. Respiratory: denies cough, dyspnea, DOE, pleurisy, hoarseness, laryngitis, wheezing.  Gastrointestinal: Denies dysphagia, odynophagia, heartburn, reflux, water brash, abdominal pain or cramps, nausea, vomiting, bloating, diarrhea, constipation, hematemesis, melena, hematochezia  or hemorrhoids. Genitourinary: Denies dysuria, frequency, urgency, nocturia, hesitancy, discharge, hematuria or flank pain. Musculoskeletal: Denies arthralgias, myalgias, stiffness, jt. swelling, pain, limping or strain/sprain.  Skin: Denies pruritus, rash, hives, warts, acne, eczema or change in skin lesion(s). Neuro: No weakness, tremor, incoordination, spasms, paresthesia or pain. Psychiatric: Denies confusion, memory loss or sensory loss. Endo: Denies change in weight, skin or hair change.  Heme/Lymph: No excessive bleeding, bruising or enlarged lymph nodes.  Physical Exam  There were no vitals taken for this visit.  Appears  well nourished, well groomed  and in no distress.  Eyes: PERRLA, EOMs, conjunctiva no swelling or erythema. Sinuses: No frontal/maxillary tenderness ENT/Mouth: EAC's clear, TM's nl w/o erythema, bulging. Nares clear w/o erythema, swelling, exudates. Oropharynx clear without erythema or exudates. Oral hygiene is good. Tongue normal, non obstructing. Hearing intact.  Neck: Supple. Thyroid not palpable. Car 2+/2+ without bruits, nodes or JVD. Chest: Respirations nl with BS clear & equal w/o rales, rhonchi, wheezing or stridor.  Cor: Heart sounds normal w/ regular rate and rhythm without sig. murmurs, gallops, clicks or rubs. Peripheral pulses normal and equal  without edema.  Abdomen: Soft & bowel sounds normal.  Non-tender w/o guarding, rebound, hernias, masses or organomegaly.  Lymphatics: Unremarkable.  Musculoskeletal: Full ROM all peripheral extremities, joint stability, 5/5 strength and normal gait.  Skin: Warm, dry without exposed rashes, lesions or ecchymosis apparent.  Neuro: Cranial nerves intact, reflexes equal bilaterally. Sensory-motor testing grossly intact. Tendon reflexes grossly intact.  Pysch: Alert & oriented x 3.  Insight and judgement nl & appropriate. No ideations.  Assessment and Plan:  1. Essential hypertension  - CBC with Differential/Platelet - COMPLETE METABOLIC PANEL WITH GFR - Magnesium - Continue medication, monitor blood pressure at home.  - Continue DASH diet.  Reminder to go to the ER if any  CP,  SOB, nausea, dizziness, severe HA, changes vision/speech.   - TSH  2. Hyperlipidemia, mixed  - Continue diet/meds, exercise,& lifestyle modifications.  - Continue monitor periodic cholesterol/liver & renal functions    - Lipid panel - TSH  3. Abnormal glucose  - Continue diet, exercise  - Lifestyle modifications.  - Monitor appropriate labs   - Hemoglobin A1c - Insulin, random  4. Vitamin D deficiency  - Continue supplementation   - VITAMIN D 25 Hydroxy   5. Paroxysmal atrial fibrillation (HCC)  - TSH  6. Aortic atherosclerosis ([place]) by CTA 04/2019  - Lipid panel  7. Medication management  - CBC with Differential/Platelet - COMPLETE METABOLIC PANEL WITH GFR - Magnesium - Lipid panel - TSH - Hemoglobin A1c - Insulin, random - VITAMIN D 25 Hydroxy          Discussed  regular exercise, BP monitoring, weight control to achieve/maintain BMI less than 25 and discussed med and SE's. Recommended labs to assess /monitor clinical status .  I discussed the assessment and treatment plan with the patient. The patient was provided an opportunity to ask questions and all were answered. The patient agreed with the plan and demonstrated an understanding  of the instructions.  I provided over 30 minutes of exam, counseling, chart review and  complex critical decision making.        The patient was advised to call back or seek an in-person evaluation if the symptoms worsen or if the condition fails to improve as anticipated.   Kirtland Bouchard, MD

## 2022-02-15 NOTE — Patient Instructions (Addendum)
Due to recent changes in healthcare laws, you may see the results of your imaging and laboratory studies on MyChart before your provider has had a chance to review them.  We understand that in some cases there may be results that are confusing or concerning to you. Not all laboratory results come back in the same time frame and the provider may be waiting for multiple results in order to interpret others.  Please give us 48 hours in order for your provider to thoroughly review all the results before contacting the office for clarification of your results.  ++++++++++++++++++++++++++  Vit D  & Vit C 1,000 mg   are recommended to help protect  against the Covid-19 and other Corona viruses.    Also it's recommended  to take  Zinc 50 mg  to help  protect against the Covid-19   and best place to get  is also on [employer]  and don't pay more than 6-8 cents /pill !   +++++++++++++++++++++++++++++++++++++++ Recommend Adult Low Dose Aspirin or  coated  Aspirin 81 mg daily  To reduce risk of Colon Cancer 40 %,  Skin Cancer 26 % ,  Melanoma 46%  and  Pancreatic cancer 60% +++++++++++++++++++++++++++++++++++++++++ Vitamin D goal  is between 70-100.  Please make sure that you are taking your Vitamin D as directed.  It is very important as a natural anti-inflammatory  helping hair, skin, and nails, as well as reducing stroke and heart attack risk.  It helps your bones and helps with mood. It also decreases numerous cancer risks so please take it as directed.  Low Vit D is associated with a 200-300% higher risk for CANCER  and 200-300% higher risk for HEART   ATTACK  &  STROKE.   ...................................... It is also associated with higher death rate at younger ages,  autoimmune diseases like Rheumatoid arthritis, Lupus, Multiple Sclerosis.    Also many other serious conditions, like depression, Alzheimer's Dementia, infertility, muscle aches, fatigue, fibromyalgia - just to name  a few. +++++++++++++++++++++++++++++++++++++++++ Recommend the book "The END of DIETING" by Dr Joel Fuhrman  & the book "The END of DIABETES " by Dr Joel Fuhrman At [employer] - get book & Audio CD's    Being diabetic has a  300% increased risk for heart attack, stroke, cancer, and alzheimer- type vascular dementia. It is very important that you work harder with diet by avoiding all foods that are white. Avoid white rice (brown & wild rice is OK), white potatoes (sweetpotatoes in moderation is OK), White bread or wheat bread or anything made out of white flour like bagels, donuts, rolls, buns, biscuits, cakes, pastries, cookies, pizza crust, and pasta (made from white flour & egg whites) - vegetarian pasta or spinach or wheat pasta is OK. Multigrain breads like Arnold's or Pepperidge [employer], or multigrain sandwich thins or flatbreads.  Diet, exercise and weight loss can reverse and cure diabetes in the early stages.  Diet, exercise and weight loss is very important in the control and prevention of complications of diabetes which affects every system in your body, ie. Brain - dementia/stroke, eyes - glaucoma/blindness, heart - heart attack/heart failure, kidneys - dialysis, stomach - gastric paralysis, intestines - malabsorption, nerves - severe painful neuritis, circulation - gangrene & loss of a leg(s), and finally cancer and Alzheimers.    I recommend avoid fried & greasy foods,  sweets/candy, white rice (brown or wild rice or Quinoa is OK), white potatoes (sweet potatoes are OK) - anything   made from white flour - bagels, doughnuts, rolls, buns, biscuits,white and wheat breads, pizza crust and traditional pasta made of white flour & egg white(vegetarian pasta or spinach or wheat pasta is OK).  Multi-grain bread is OK - like multi-grain flat bread or sandwich thins. Avoid alcohol in excess. Exercise is also important.    Eat all the vegetables you want - avoid meat, especially red meat and dairy - especially  cheese.  Cheese is the most concentrated form of trans-fats which is the worst thing to clog up our arteries. Veggie cheese is OK which can be found in the fresh produce section at [employer] or [employer] or [employer]  +++++++++++++++++++++++++++++++++++++++ DASH Eating Plan  DASH stands for "Dietary Approaches to Stop Hypertension."   The DASH eating plan is a healthy eating plan that has been shown to reduce high blood pressure (hypertension). Additional health benefits may include reducing the risk of type 2 diabetes mellitus, heart disease, and stroke. The DASH eating plan may also help with weight loss. WHAT DO I NEED TO KNOW ABOUT THE DASH EATING PLAN? For the DASH eating plan, you will follow these general guidelines: Choose foods with a percent daily value for sodium of less than 5% (as listed on the food label). Use salt-free seasonings or herbs instead of table salt or sea salt. Check with your health care provider or pharmacist before using salt substitutes. Eat lower-sodium products, often labeled as "lower sodium" or "no salt added." Eat fresh foods. Eat more vegetables, fruits, and low-fat dairy products. Choose whole grains. Look for the word "whole" as the first word in the ingredient list. Choose fish  Limit sweets, desserts, sugars, and sugary drinks. Choose heart-healthy fats. Eat veggie cheese  Eat more home-cooked food and less restaurant, buffet, and fast food. Limit fried foods. Cook foods using methods other than frying. Limit canned vegetables. If you do use them, rinse them well to decrease the sodium. When eating at a restaurant, ask that your food be prepared with less salt, or no salt if possible.                      WHAT FOODS CAN I EAT? Read Dr Joel Fuhrman's books on The End of Dieting & The End of Diabetes  Grains Whole grain or whole wheat bread. Brown rice. Whole grain or whole wheat pasta. Quinoa, bulgur, and whole grain cereals. Low-sodium  cereals. Corn or whole wheat flour tortillas. Whole grain cornbread. Whole grain crackers. Low-sodium crackers.  Vegetables Fresh or frozen vegetables (raw, steamed, roasted, or grilled). Low-sodium or reduced-sodium tomato and vegetable juices. Low-sodium or reduced-sodium tomato sauce and paste. Low-sodium or reduced-sodium canned vegetables.   Fruits All fresh, canned (in natural juice), or frozen fruits.  Protein Products  All fish and seafood.  Dried beans, peas, or lentils. Unsalted nuts and seeds. Unsalted canned beans.  Dairy Low-fat dairy products, such as skim or 1% milk, 2% or reduced-fat cheeses, low-fat ricotta or cottage cheese, or plain low-fat yogurt. Low-sodium or reduced-sodium cheeses.  Fats and Oils Tub margarines without trans fats. Light or reduced-fat mayonnaise and salad dressings (reduced sodium). Avocado. Safflower, olive, or canola oils. Natural peanut or almond butter.  Other Unsalted popcorn and pretzels. The items listed above may not be a complete list of recommended foods or beverages. Contact your dietitian for more options.  +++++++++++++++  WHAT FOODS ARE NOT RECOMMENDED? Grains/ White flour or wheat flour White bread. White pasta. White rice. Refined   cornbread. Bagels and croissants. Crackers that contain trans fat.  Vegetables  Creamed or fried vegetables. Vegetables in a . Regular canned vegetables. Regular canned tomato sauce and paste. Regular tomato and vegetable juices.  Fruits Dried fruits. Canned fruit in light or heavy syrup. Fruit juice.  Meat and Other Protein Products Meat in general - RED meat & White meat.  Fatty cuts of meat. Ribs, chicken wings, all processed meats as bacon, sausage, bologna, salami, fatback, hot dogs, bratwurst and packaged luncheon meats.  Dairy Whole or 2% milk, cream, half-and-half, and cream cheese. Whole-fat or sweetened yogurt. Full-fat cheeses or blue cheese. Non-dairy creamers and whipped toppings.  Processed cheese, cheese spreads, or cheese curds.  Condiments Onion and garlic salt, seasoned salt, table salt, and sea salt. Canned and packaged gravies. Worcestershire sauce. Tartar sauce. Barbecue sauce. Teriyaki sauce. Soy sauce, including reduced sodium. Steak sauce. Fish sauce. Oyster sauce. Cocktail sauce. Horseradish. Ketchup and mustard. Meat flavorings and tenderizers. Bouillon cubes. Hot sauce. Tabasco sauce. Marinades. Taco seasonings. Relishes.  Fats and Oils Butter, stick margarine, lard, shortening and bacon fat. Coconut, palm kernel, or palm oils. Regular salad dressings.  Pickles and olives. Salted popcorn and pretzels.  The items listed above may not be a complete list of foods and beverages to avoid. ========================================  Apixaban Tablets (Eliquis)   What is this medication?  APIXABAN (a PIX a ban) prevents and treats blood clots. It is also used to lower the risk of stroke in people with AFib (atrial fibrillation). It belongs to a group of medications called blood thinners.  This medicine may be used for other purposes; ask your health care provider or pharmacist if you have questions. COMMON BRAND NAME(S): Eliquis   What should I tell my care team before I take this medication?  They need to know if you have any of these conditions:  Antiphospholipid antibody syndrome Bleeding disorder History of bleeding in the brain History of blood clots History of stomach bleeding Kidney disease Liver disease Mechanical heart valve Spinal surgery An unusual or allergic reaction to apixaban, other medications, foods, dyes, or preservatives Pregnant or trying to get pregnant  How should I use this medication?  Take this medication by mouth. For your therapy to work as well as possible, take each dose exactly as prescribed on the prescription label. Do not skip doses.   Skipping doses or stopping this medication can increase your risk of a blood  clot or stroke. Keep taking this medication unless your care team tells you to stop. Take it as directed on the prescription label at the same time every day. You can take it with or without food. If it upsets your stomach, take it with food. A special MedGuide will be given to you by the pharmacist with each prescription and refill. Be sure to read this information carefully each time. Talk to your care team about the use of this medication in children. Special care may be needed. Overdosage: If you think you have taken too much of this medicine contact a poison control center or emergency room at once. NOTE: This medicine is only for you. Do not share this medicine with others.  What if I miss a dose?  If you miss a dose, take it as soon as you can. If it is almost time for your next dose, take only that dose. Do not take double or extra doses.  What may interact with this medication?  This medication may interact with the following:  Certain medications for fungal infections like itraconazole and ketoconazole Certain medications for seizures like carbamazepine and phenytoin Certain medications for blood clots like enoxaparin, dalteparin, heparin, and warfarin Clarithromycin NSAIDs, medications for pain and inflammation, like ibuprofen or naproxen Rifampin Ritonavir St. John's wort  This list may not describe all possible interactions. Give your health care provider a list of all the medicines, herbs, non-prescription drugs, or dietary supplements you use. Also tell them if you smoke, drink alcohol, or use illegal drugs. Some items may interact with your medicine.  What should I watch for while using this medication?  Visit your care team for regular checks on your progress. Your condition will be monitored carefully while you are receiving this medication.  You may need blood work while taking this medication.  Avoid sports and activities that might cause injury while you are using  this medication. Severe falls or injuries can cause unseen bleeding. Be careful when using sharp tools or knives. Consider using an Copy. Take special care brushing or flossing your teeth. Report any injuries, bruising, or red spots on the skin to your care team.  If you are going to need surgery or other procedure, tell your care team that you are taking this medication. Wear a medical ID bracelet or chain. Carry a card that describes your condition. List the medications and doses you take on the card. What side effects may I notice from receiving this medication?  Side effects that you should report to your care team as soon as possible:  Allergic reactions--skin rash, itching, hives, swelling of the face, lips, tongue, or throat Bleeding--bloody or black, tar-like stools, vomiting blood or brown material that looks like coffee grounds, red or dark brown urine, small red or purple spots on the skin, unusual bruising or bleeding Bleeding in the brain--severe headache, stiff neck, confusion, dizziness, change in vision, numbness or weakness of the face, arm, or leg, trouble speaking, trouble walking, vomiting Heavy periods  This list may not describe all possible side effects. Call your doctor for medical advice about side effects. You may report side effects to FDA at 1-800-FDA-1088.  Where should I keep my medication?  Keep out of the reach of children and pets.  Store at room temperature between 20 and 25 degrees C (68 and 77 degrees F). Get rid of any unused medication after the expiration date.  To get rid of medications that are no longer needed or expired:  Take the medication to a medication take-back program. Check with your pharmacy or law enforcement to find a location.  If you cannot return the medication, check the label or package insert to see if the medication should be thrown out in the garbage or flushed down the toilet.   If you are not sure, ask your care  team. If it is safe to put in the trash, empty the medication out of the container.   Mix the medication with cat litter, dirt, coffee grounds, or other unwanted substance. Seal the mixture in a bag or container. Put it in the trash.  NOTE: This sheet is a summary. It may not cover all possible information.   If you have questions about this medicine, talk to your doctor, pharmacist, or health care provider.

## 2022-02-16 ENCOUNTER — Ambulatory Visit (INDEPENDENT_AMBULATORY_CARE_PROVIDER_SITE_OTHER): Payer: TRICARE For Life (TFL) | Admitting: Internal Medicine

## 2022-02-16 ENCOUNTER — Encounter: Payer: Self-pay | Admitting: Internal Medicine

## 2022-02-16 VITALS — BP 136/80 | HR 65 | Temp 97.9°F | Resp 16 | Ht 68.0 in | Wt 241.6 lb

## 2022-02-16 DIAGNOSIS — I10 Essential (primary) hypertension: Secondary | ICD-10-CM | POA: Diagnosis not present

## 2022-02-16 DIAGNOSIS — E782 Mixed hyperlipidemia: Secondary | ICD-10-CM | POA: Diagnosis not present

## 2022-02-16 DIAGNOSIS — R7309 Other abnormal glucose: Secondary | ICD-10-CM | POA: Diagnosis not present

## 2022-02-16 DIAGNOSIS — Z79899 Other long term (current) drug therapy: Secondary | ICD-10-CM

## 2022-02-16 DIAGNOSIS — I700 Atherosclerosis of aorta: Secondary | ICD-10-CM | POA: Diagnosis not present

## 2022-02-16 DIAGNOSIS — N3281 Overactive bladder: Secondary | ICD-10-CM | POA: Diagnosis not present

## 2022-02-16 DIAGNOSIS — Z23 Encounter for immunization: Secondary | ICD-10-CM | POA: Diagnosis not present

## 2022-02-16 DIAGNOSIS — E559 Vitamin D deficiency, unspecified: Secondary | ICD-10-CM

## 2022-02-16 DIAGNOSIS — I480 Paroxysmal atrial fibrillation: Secondary | ICD-10-CM

## 2022-02-16 MED ORDER — OXYBUTYNIN CHLORIDE ER 10 MG PO TB24: ORAL_TABLET | ORAL | 0 refills | Status: DC

## 2022-02-17 LAB — COMPLETE METABOLIC PANEL WITH GFR
AG Ratio: 1.3 (calc) (ref 1.0–2.5)
ALT: 10 U/L (ref 6–29)
AST: 16 U/L (ref 10–35)
Albumin: 4.1 g/dL (ref 3.6–5.1)
Alkaline phosphatase (APISO): 78 U/L (ref 37–153)
BUN: 20 mg/dL (ref 7–25)
CO2: 27 mmol/L (ref 20–32)
Calcium: 9.3 mg/dL (ref 8.6–10.4)
Chloride: 100 mmol/L (ref 98–110)
Creat: 0.74 mg/dL (ref 0.50–1.05)
Globulin: 3.1 g/dL (calc) (ref 1.9–3.7)
Glucose, Bld: 82 mg/dL (ref 65–99)
Potassium: 4.2 mmol/L (ref 3.5–5.3)
Sodium: 136 mmol/L (ref 135–146)
Total Bilirubin: 0.6 mg/dL (ref 0.2–1.2)
Total Protein: 7.2 g/dL (ref 6.1–8.1)
eGFR: 88 mL/min/{1.73_m2} (ref >=60)

## 2022-02-17 LAB — CBC WITH DIFFERENTIAL/PLATELET
Absolute Monocytes: 623 cells/uL (ref 200–950)
Basophils Absolute: 28 cells/uL (ref 0–200)
Basophils Relative: 0.4 %
Eosinophils Absolute: 119 cells/uL (ref 15–500)
Eosinophils Relative: 1.7 %
HCT: 39.2 % (ref 35.0–45.0)
Hemoglobin: 13.2 g/dL (ref 11.7–15.5)
Lymphs Abs: 1799 cells/uL (ref 850–3900)
MCH: 29.4 pg (ref 27.0–33.0)
MCHC: 33.7 g/dL (ref 32.0–36.0)
MCV: 87.3 fL (ref 80.0–100.0)
MPV: 11.2 fL (ref 7.5–12.5)
Monocytes Relative: 8.9 %
Neutro Abs: 4431 cells/uL (ref 1500–7800)
Neutrophils Relative %: 63.3 %
Platelets: 274 10*3/uL (ref 140–400)
RBC: 4.49 10*6/uL (ref 3.80–5.10)
RDW: 12.3 % (ref 11.0–15.0)
Total Lymphocyte: 25.7 %
WBC: 7 10*3/uL (ref 3.8–10.8)

## 2022-02-17 LAB — TSH: TSH: 1.33 mIU/L (ref 0.40–4.50)

## 2022-02-17 LAB — LIPID PANEL
Cholesterol: 160 mg/dL (ref <200)
HDL: 50 mg/dL (ref >=50)
LDL Cholesterol (Calc): 80 mg/dL (calc)
Non-HDL Cholesterol (Calc): 110 mg/dL (calc) (ref <130)
Total CHOL/HDL Ratio: 3.2 (calc) (ref <5.0)
Triglycerides: 209 mg/dL — ABNORMAL HIGH (ref <150)

## 2022-02-17 LAB — HEMOGLOBIN A1C
Hgb A1c MFr Bld: 5.7 % of total Hgb — ABNORMAL HIGH (ref <5.7)
Mean Plasma Glucose: 117 mg/dL
eAG (mmol/L): 6.5 mmol/L

## 2022-02-17 LAB — MAGNESIUM: Magnesium: 1.9 mg/dL (ref 1.5–2.5)

## 2022-02-17 LAB — VITAMIN D 25 HYDROXY (VIT D DEFICIENCY, FRACTURES): Vit D, 25-Hydroxy: 82 ng/mL (ref 30–100)

## 2022-02-17 LAB — INSULIN, RANDOM: Insulin: 10.3 u[IU]/mL

## 2022-02-17 NOTE — Progress Notes (Signed)
<><><><><><><><><><><><><><><><><><><><><><><><><><><><><><><><><> <><><><><><><><><><><><><><><><><><><><><><><><><><><><><><><><><> - Test results slightly outside the reference range are not unusual.  If there is anything important, I will review this with you,  otherwise it is considered normal test values.  If you have further questions,  please do not hesitate to contact me at the office or via My Chart.  <><><><><><><><><><><><><><><><><><><><><><><><><><><><><><><><><> <><><><><><><><><><><><><><><><><><><><><><><><><><><><><><><><><>  -  A1c = 5.7 % is slightly elevated high normal means                                                                             high normal average blood sugar , So   - Avoid Sweets, Candy & White Stuff   - White Rice, White Potatoes, White Flour  - Breads &  Pasta  <><><><><><><><><><><><><><><><><><><><><><><><><><><><><><><><><>  -  Total Chol = 160   &   LDL Chol = 80   -       Both  Excellent   - Very low risk for Heart Attack  / Stroke <><><><><><><><><><><><><><><><><><><><><><><><><><><><><><><><><> <><><><><><><><><><><><><><><><><><><><><><><><><><><><><><><><><> - Test results slightly outside the reference range are not unusual.  If there is anything important, I will review this with you,  otherwise it is considered normal test values.  If you have further questions,  please do not hesitate to contact me at the office or via My Chart.  <><><><><><><><><><><><><><><><><><><><><><><><><><><><><><><><><> <><><><><><><><><><><><><><><><><><><><><><><><><><><><><><><><><>  -  A1c = 5.7 % is slightly elevated high normal means high normal average blood sugar , So   - Avoid Sweets, Candy & White Stuff   - White Rice, White Potatoes, White Flour  - Breads &  Pasta   <><><><><><><><><><><><><><><><><><><><><><><><><><><><><><><><><> <><><><><><><><><><><><><><><><><><><><><><><><><><><><><><><><><> <><><><><><><><><><><><><><><><><><><><><><><><><><><><><><><><><> - Test results slightly outside the reference range are not unusual.  If there is anything important, I will review this with you,  otherwise it is considered normal test values.  If you have further questions,  please do not hesitate to contact me at the office or via My Chart.  <><><><><><><><><><><><><><><><><><><><><><><><><><><><><><><><><> <><><><><><><><><><><><><><><><><><><><><><><><><><><><><><><><><>  -  A1c = 5.7 % is slightly elevated high normal means high normal average blood sugar , So   - Avoid Sweets, Candy & White Stuff   - White Rice, White Potatoes, White Flour  - Breads &  Pasta  <><><><><><><><><><><><><><><><><><><><><><><><><><><><><><><><><> <><><><><><><><><><><><><><><><><><><><><><><><><><><><><><><><><> <><><><><><><><><><><><><><><><><><><><><><><><><><><><><><><><><> - Test results slightly outside the reference range are not unusual.  If there is anything important, I will review this with you,  otherwise it is considered normal test values.  If you have further questions,  please do not hesitate to contact me at the office or via My Chart.  <><><><><><><><><><><><><><><><><><><><><><><><><><><><><><><><><> <><><><><><><><><><><><><><><><><><><><><><><><><><><><><><><><><>  -  A1c = 5.7 % is slightly elevated high normal means high normal average blood sugar , So   - Avoid Sweets, Candy & White Stuff   - White Rice, White Potatoes, White Flour  - Breads &  Pasta  <><><><><><><><><><><><><><><><><><><><><><><><><><><><><><><><><> <><><><><><><><><><><><><><><><><><><><><><><><><><><><><><><><><>  -  But Triglycerides (   209    ) or fats in blood are too high                 (   Ideal or  Goal is less than 150  !  )    - Recommend avoid fried &  greasy foods,  sweets / candy,   - Avoid  white rice  (brown or wild rice or Quinoa is OK),   - Avoid white potatoes  (sweet potatoes are OK)   - Avoid anything made from white flour  - bagels, doughnuts, rolls, buns, biscuits, white and   wheat breads, pizza crust and traditional  pasta made of white flour & egg white  - (vegetarian pasta or spinach or wheat pasta is OK).    - Multi-grain bread is OK - like multi-grain flat bread or  sandwich thins.   - Avoid alcohol in excess.   - Exercise is also important. <><><><><><><><><><><><><><><><><><><><><><><><><><><><><><><><><> <><><><><><><><><><><><><><><><><><><><><><><><><><><><><><><><><>  -  Vitamin D = 82  -- Excellent - Please keep dose same    <><><><><><><><><><><><><><><><><><><><><><><><><><><><><><><><><> <><><><><><><><><><><><><><><><><><><><><><><><><><><><><><><><><>  . All Else - CBC - Kidneys - Electrolytes - Liver - Magnesium & Thyroid    - all  Normal / OK  <><><><><><><><><><><><><><><><><><><><><><><><><><><><><><><><><> <><><><><><><><><><><><><><><><><><><><><><><><><><><><><><><><><>

## 2022-03-04 ENCOUNTER — Encounter: Payer: Self-pay | Admitting: Internal Medicine

## 2022-03-05 ENCOUNTER — Encounter: Payer: Self-pay | Admitting: Nurse Practitioner

## 2022-03-07 ENCOUNTER — Other Ambulatory Visit: Payer: Self-pay | Admitting: Internal Medicine

## 2022-03-07 DIAGNOSIS — K219 Gastro-esophageal reflux disease without esophagitis: Secondary | ICD-10-CM

## 2022-03-07 MED ORDER — PANTOPRAZOLE SODIUM 40 MG PO TBEC: DELAYED_RELEASE_TABLET | ORAL | 3 refills | Status: DC

## 2022-03-22 ENCOUNTER — Telehealth: Payer: Medicare Other | Admitting: Nurse Practitioner

## 2022-03-22 DIAGNOSIS — H1033 Unspecified acute conjunctivitis, bilateral: Secondary | ICD-10-CM

## 2022-03-22 MED ORDER — POLYMYXIN B-TRIMETHOPRIM 10000-0.1 UNIT/ML-% OP SOLN: 1.0000 [drp] | Freq: Four times a day (QID) | OPHTHALMIC | 0 refills | Status: AC

## 2022-03-22 NOTE — Progress Notes (Signed)
E-Visit for Claudia [name]   We are sorry that you are not feeling well.  Here is how we plan to help!  Based on what you have shared with me it looks like you have conjunctivitis.  Conjunctivitis is a common inflammatory or infectious condition of the eye that is often referred to as "pink eye".  In most cases it is contagious (viral or bacterial). However, not all conjunctivitis requires antibiotics (ex. Allergic).  We have made appropriate suggestions for you based upon your presentation.  I have prescribed Polytrim Ophthalmic drops 1-2 drops 4 times a day times 5 days  Pink eye can be highly contagious.  It is typically spread through direct contact with secretions, or contaminated objects or surfaces that one may have touched.  Strict handwashing is suggested with soap and water is urged.  If not available, use alcohol based had sanitizer.  Avoid unnecessary touching of the eye.  If you wear contact lenses, you will need to refrain from wearing them until you see no white discharge from the eye for at least 24 hours after being on medication.  You should see symptom improvement in 1-2 days after starting the medication regimen.  Call us if symptoms are not improved in 1-2 days.  Home Care: Wash your hands often! Do not wear your contacts until you complete your treatment plan. Avoid sharing towels, bed linen, personal items with a person who has pink eye. See attention for anyone in your home with similar symptoms.  Get Help Right Away If: Your symptoms do not improve. You develop blurred or loss of vision. Your symptoms worsen (increased discharge, pain or redness)   Thank you for choosing an e-visit.  Your e-visit answers were reviewed by a board certified advanced clinical practitioner to complete your personal care plan. Depending upon the condition, your plan could have included both over the counter or prescription medications.  Please review your pharmacy choice. Make sure the  pharmacy is open so you can pick up prescription now. If there is a problem, you may contact your provider through [employer] and have the prescription routed to another pharmacy.  Your safety is important to [place]. If you have drug allergies check your prescription carefully.   For the next 24 hours you can use MyChart to ask questions about today's visit, request a non-urgent call back, or ask for a work or school excuse. You will get an email in the next two days asking about your experience. I hope that your e-visit has been valuable and will speed your recovery.  Meds ordered this encounter  Medications   trimethoprim-polymyxin b (POLYTRIM) ophthalmic solution    Sig: Place 1 drop into both eyes in the morning, at noon, in the evening, and at bedtime for 5 days.    Dispense:  10 mL    Refill:  0    I spent approximately 5 minutes reviewing the patient's history, current symptoms and coordinating their care today.

## 2022-03-25 ENCOUNTER — Other Ambulatory Visit: Payer: Self-pay | Admitting: Internal Medicine

## 2022-04-15 ENCOUNTER — Encounter: Payer: Self-pay | Admitting: Sports Medicine

## 2022-04-15 ENCOUNTER — Ambulatory Visit (INDEPENDENT_AMBULATORY_CARE_PROVIDER_SITE_OTHER): Payer: Medicare Other | Admitting: Sports Medicine

## 2022-04-15 ENCOUNTER — Ambulatory Visit: Payer: Self-pay

## 2022-04-15 ENCOUNTER — Ambulatory Visit (INDEPENDENT_AMBULATORY_CARE_PROVIDER_SITE_OTHER): Payer: Medicare Other

## 2022-04-15 DIAGNOSIS — M2142 Flat foot [pes planus] (acquired), left foot: Secondary | ICD-10-CM

## 2022-04-15 DIAGNOSIS — M7661 Achilles tendinitis, right leg: Secondary | ICD-10-CM | POA: Diagnosis not present

## 2022-04-15 DIAGNOSIS — M2141 Flat foot [pes planus] (acquired), right foot: Secondary | ICD-10-CM | POA: Diagnosis not present

## 2022-04-15 DIAGNOSIS — G8929 Other chronic pain: Secondary | ICD-10-CM

## 2022-04-15 DIAGNOSIS — M79672 Pain in left foot: Secondary | ICD-10-CM

## 2022-04-15 DIAGNOSIS — M79671 Pain in right foot: Secondary | ICD-10-CM | POA: Diagnosis not present

## 2022-04-15 NOTE — Progress Notes (Signed)
Claudia [name] - 72 y.o. female MRN [id number]  Date of birth: 09-22-1950  Office Visit Note: Visit Date: 04/15/2022 PCP: Unk Pinto, MD Referred by: Unk Pinto, MD  Subjective: No chief complaint on file.  HPI: Claudia [name] is a pleasant 72 y.o. female who presents today for bilateral heel and achilles pain.  Patient has had bilateral heel pain and distal Achilles tendon pain for more than 10 years.  She has tried many treatment regimen over the years.  She had some relief but only temporary from oral prednisone.  Also tried a cam walker boot and with improvement in pain when she wear this, however after she came out her pain returned.  Also found a chiropractor years ago who performed "active release" which helped tremendously however she had to go monthly and this became rather costly.  Pain is located more so over the superior heel bone.  Sometimes gets a burning sensation.  Denies any pain over the plantar fascia region.  She does have a history of plantar fascia which do well with cortisone steroid injection.  Her right is worse than the left, sometimes this will go up into the Achilles tendon.  Denies any specific injury.  Has not done any home rehab or physical therapy in the past, eccentric calf exercises made her pain worse in the past.  She cannot take NSAIDs as she is on a blood thinner.  Tylenol does not help.  Pertinent ROS were reviewed with the patient and found to be negative unless otherwise specified above in HPI.   Assessment & Plan: Visit Diagnoses:  1. Chronic heel pain, left   2. Chronic heel pain, right   3. Pes planus of both feet   4. Achilles tendinitis, right leg    Plan: Discussed with "[name]" the nature of her heel and Achilles pain.  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 Her right side is definitely more aggravated than her left.  We  walked through all treatment options, through shared decision making elected to proceed with extracorporeal shockwave treatment to both heels and Achilles.  Right side was rather tender with treatment.  I would like to see how she does over the coming days, she will be present next week for 1 additional treatment and then see if this is something that is worthwhile.  We did get her started on some home exercises that I showed her today for calf and soleus stretching as well as ground for level eccentric exercises, she will perform these once daily.  Other treatment considerations include orthotic support to help with her pes planovalgus.  May consider ultrasound of the right Achilles to see the quality of the tendon.  It is more of the bone that is bothering her, could always have her see my partner, Dr. Sharol Given to see what surgical options could be done for Haglund's.  Follow-up: Return in about 1 week (around 04/22/2022) for F/u in 1-2 weeks for bilateral achilles (30-mins for procedure).   Meds & Orders: No orders of the defined types were placed in this encounter.   Orders Placed This Encounter  Procedures   XR Ankle 2 Views Left   XR Ankle 2 Views Right     Procedures: Procedure: ECSWT Indications: Heel spur, Achilles tendinopathy   Procedure Details Consent: Risks of procedure as well as the alternatives and risks of each  were explained to the patient.  Verbal consent for procedure obtained. Time Out: Verified patient identification, verified procedure, site was marked, verified correct patient position. The area was cleaned with alcohol swab.     The left calcaneus and Achilles tendon was targeted for Extracorporeal shockwave therapy.    Preset: Heel spur Power Level: 100 Frequency: 10 Hz Impulse/cycles: 2500 Head size: Regular   Patient tolerated procedure well without immediate complications.  The right calcaneus and Achilles tendon was targeted for Extracorporeal shockwave  therapy.    Preset: Heel spur Power Level: 80 Frequency: 6-8 Hz Impulse/cycles: 2000 Head size: Regular   Patient tolerated procedure well without immediate complications.         Clinical History: No specialty comments available.  She reports that she quit smoking about 18 years ago. Her smoking use included cigarettes. She has never used smokeless tobacco.  Recent Labs    06/30/21 1609 02/16/22 1432  HGBA1C 5.5 5.7*    Objective:    Physical Exam  Gen: Well-appearing, in no acute distress; non-toxic CV: Well-perfused. Warm.  Resp: Breathing unlabored on room air; no wheezing. Psych: Fluid speech in conversation; appropriate affect; normal thought process Neuro: Sensation intact throughout. No gross coordination deficits.   Ortho Exam - Bilateral LE's: There is no redness, effusion or swelling about the ankle or feet.  Positive TTP of the superior calcaneus of the right greater than left heel.  There is some soft tissue swelling over the mid and distal aspect of the right Achilles without redness.  Negative calcaneal heel squeeze bilaterally.  Range of motion relatively well-preserved plantarflexion and dorsiflexion.  There is loss of longitudinal arch bilaterally upon standing.  Notable pes planovalgus.  Imaging:  *Independent review of foot x-rays from 05/21/2013 of the right and left foot were reviewed and interpreted by myself.  There is posterior enthesophytes of the superior calcaneus as well as inferior heel spurs noted bilaterally.  Bilateral pes planus.  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  DG Foot Xrays 05/21/2013: Narrative  X-ray report weightbearing right foot  Posterior and inferior heel spurs noted. 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  Radiographic impression no acute bony abnormality noted.   X-ray report weightbearing left foot   HAV deformity with hallux interphalangeous noted.  Well-organized posterior  and inferior heel spurs noted. A Taylor's bunion noted as well.   Radiographic impression: No acute bony abnormality noted.   XR Ankle 2 Views Left  Result Date: 04/15/2022 2 views of the left ankle including AP and lateral film were ordered and reviewed by myself.  There is notable enthesophytes at the calcaneus near the distal Achilles insertion, small plantar calcaneal spur.  Vascular calcification noted on lateral view.  No significant ankle OA or acute fracture.  XR Ankle 2 Views Right  Result Date: 04/15/2022 2 view of the right ankle including AP and lateral ordered and reviewed by myself.  X-rays demonstrate focused to the calcaneus that has enthesophytes near the distal Achilles insertion.  There is a very small Haglund's lesion as well.  Small plantar calcaneal spur.  Vascular calcification noted on lateral view.  No acute fracture noted.   Past Medical/Family/Surgical/Social History: Medications & Allergies reviewed per EMR, new medications updated. Patient Active Problem List   Diagnosis Date Noted   OAB (overactive bladder) 12/18/2019   Melasma 12/17/2019   Aortic atherosclerosis ([place]) by CTA 04/2019 06/05/2019   Osteoporosis 05/08/2018   DJD (degenerative joint  disease) 05/08/2018   Thoracic aortic aneurysm ([place]) 08/13/2016   Morbid obesity ([place]) - BMI 35+ with htn, hld 03/16/2015   Atrial fibrillation ([place]) 10/29/2013   Medication management 02/18/2013   Hypertension    Hyperlipidemia    Abnormal glucose    Vitamin D deficiency    IBS (irritable bowel syndrome)    Insomnia    Past Medical History:  Diagnosis Date   Achilles tendonitis    R heal, limits activity   Allergy    Anxiety    Elevated hemoglobin A1c    Hyperlipidemia    Hypertension    IBS (irritable bowel syndrome)    Insomnia    Obesity    Palpitations    SVT (supraventricular tachycardia) 10/29/2013   Vitamin D deficiency    Family History  Problem Relation Age of  Onset   Heart attack Mother    Stroke Mother    Heart disease Mother    Diabetes Mother    Macular degeneration Mother    Heart attack Father    Alcohol abuse Father    Ovarian cancer Maternal Grandmother    Colon cancer Neg Hx    Past Surgical History:  Procedure Laterality Date   CESAREAN SECTION     CESAREAN SECTION     CHOLECYSTECTOMY  10 years ago   TUBAL LIGATION Bilateral    Social History   Occupational History   Not on file  Tobacco Use   Smoking status: Former    Types: Cigarettes    Quit date: 04/01/2004    Years since quitting: 18.0   Smokeless tobacco: Never   Tobacco comments:    Social smoker times 10 years  Vaping Use   Vaping Use: Never used  Substance and Sexual Activity   Alcohol use: Yes    Alcohol/week: 0.0 standard drinks of alcohol    Comment: rarely, < 1 beer per week   Drug use: No   Sexual activity: Not on file

## 2022-04-15 NOTE — Progress Notes (Signed)
Bilateral achilles tendon pain 10+ years  Has tried several treatment remedies;  Prednisone- provided relief while on it Boot- provided relief while wearing  *found chiropractor who did "active release"- this helped tremendously however had to go every 4 weeks for treatment - stopped 6 months ago  Burning sensation  Right worse than left

## 2022-04-16 ENCOUNTER — Other Ambulatory Visit: Payer: Self-pay | Admitting: Internal Medicine

## 2022-04-16 DIAGNOSIS — N3281 Overactive bladder: Secondary | ICD-10-CM

## 2022-04-16 MED ORDER — OXYBUTYNIN CHLORIDE ER 10 MG PO TB24: ORAL_TABLET | ORAL | 3 refills | Status: DC

## 2022-04-22 ENCOUNTER — Ambulatory Visit: Payer: Medicare Other | Admitting: Sports Medicine

## 2022-04-22 NOTE — Progress Notes (Signed)
CARDIOLOGY OFFICE NOTE  Date:  05/05/2022    Claudia [name] Date of Birth: 14-Sep-1950 Medical Record #[id number]  PCP:  Unk Pinto, MD  Cardiologist:  [name]  No chief complaint on file.   History of Present Illness:  72 y.o. with HTN, PAF, DM likely OSA but no sleep study done. She has CHA2VASC score 3 and is maintained on eliquis. Tends toward bradycardia and beta blocker dose has been decreased   Event monitor benign PAC;s 10/26/16 Myovue normal EF 68% 10/19/16   Stable mild aortic root dilatation by CT 04/24/19 3.8 cm   IBS bothering her more   Lost close to 40 lbs with weight watchers Has chronic knee pain and seess [facility] for heel and achilles pain worse on right ahd extracorporeal shockwave RX 04/15/22  Xray no fracture small spurs and mild Haglund lesion Told her not to take Tumeric with her eliquis Discussed Ozempic Rx for weight loss   No pre syncope or excessively slow pulse     Past Medical History:  Diagnosis Date   Achilles tendonitis    R heal, limits activity   Allergy    Anxiety    Elevated hemoglobin A1c    Hyperlipidemia    Hypertension    IBS (irritable bowel syndrome)    Insomnia    Obesity    Palpitations    SVT (supraventricular tachycardia) 10/29/2013   Vitamin D deficiency     Past Surgical History:  Procedure Laterality Date   CESAREAN SECTION     CESAREAN SECTION     CHOLECYSTECTOMY  10 years ago   TUBAL LIGATION Bilateral      Medications: Current Meds  Medication Sig   apixaban (ELIQUIS) 5 MG TABS tablet Take 1 tablet (5 mg total) by mouth 2 (two) times daily.   Ascorbic Acid (VITAMIN C PO) Take by mouth 2 (two) times daily.   Cholecalciferol (VITAMIN D-3 PO) Take 8,000 Units by mouth daily.   CINNAMON PO Take 1,000 mg by mouth daily.    diclofenac Sodium (VOLTAREN) 1 % GEL Apply 4 g topically 4 (four) times daily as needed.   DiphenhydrAMINE HCl (BENADRYL ALLERGY PO) Take by mouth daily.    hydrochlorothiazide (HYDRODIURIL) 25 MG tablet TAKE 1 TABLET DAILY FOR BLOOD PRESSURE AND FLUID RETENTION, ANKLE SWELLING   loperamide (IMODIUM) 2 MG capsule Take 4 mg by mouth daily.    Menaquinone-7 (VITAMIN K2 PO) Take by mouth daily.   metoprolol succinate (TOPROL XL) 25 MG 24 hr tablet Take 0.5 tablets (12.5 mg total) by mouth daily.   oxybutynin (DITROPAN XL) 10 MG 24 hr tablet Take  1 tablet  at Suppertime  for OverActive Bladder   pantoprazole (PROTONIX) 40 MG tablet Take  1 tablet  Daily  to Prevent Heartburn &  Indigestion   pravastatin (PRAVACHOL) 20 MG tablet TAKE 1 TABLET EVERY OTHER DAY IN THE EVENING FOR CHOLESTEROL AND PLAQUE   tretinoin (RETIN-A) 0.05 % cream Apply topically at bedtime.   valsartan (DIOVAN) 320 MG tablet TAKE 1 TABLET AT NIGHT FOR BLOOD PRESSURE   vitamin B-12 (CYANOCOBALAMIN) 1000 MCG tablet Take 1,000 mcg by mouth daily.     Allergies: Allergies  Allergen Reactions   Ciprofloxacin    Levaquin [Levofloxacin In D5w]     No fluroquinolones due to ascending thoracic aortic aneurysm   Penicillins Other (See Comments)    Had as a child=unknown reaction   Zoloft [Sertraline Hcl]     Social History: The  patient  reports that she quit smoking about 18 years ago. Her smoking use included cigarettes. She has never used smokeless tobacco. She reports current alcohol use. She reports that she does not use drugs.   Family History: The patient's family history includes Alcohol abuse in her father; Diabetes in her mother; Heart attack in her father and mother; Heart disease in her mother; Macular degeneration in her mother; Ovarian cancer in her maternal grandmother; Stroke in her mother.   Review of Systems: Please see the history of present illness.   Otherwise, the review of systems is positive for none.   All other systems are reviewed and negative.   Physical Exam: VS:  BP 130/70   Pulse 60   Ht '5\' 8"'$  (1.727 m)   Wt 242 lb (109.8 kg)   BMI 36.80 kg/m  .   BMI Body mass index is 36.8 kg/m.  Wt Readings from Last 3 Encounters:  05/05/22 242 lb (109.8 kg)  02/16/22 241 lb 9.6 oz (109.6 kg)  01/01/22 240 lb (108.9 kg)   Affect appropriate Healthy:  appears stated age 67: normal Neck supple with no adenopathy JVP normal no bruits no thyromegaly Lungs clear with no wheezing and good diaphragmatic motion Heart:  S1/S2 2/6 SEM murmur, no rub, gallop or click PMI normal Abdomen: benighn, BS positve, no tenderness, no AAA no bruit.  No HSM or HJR Distal pulses intact with no bruits No edema Neuro non-focal Skin warm and dry No muscular weakness    LABORATORY DATA:  EKG:  10/12/16  This demonstrates NSR.  01/22/20 SR rate 49 normal   Lab Results  Component Value Date   WBC 7.0 02/16/2022   HGB 13.2 02/16/2022   HCT 39.2 02/16/2022   PLT 274 02/16/2022   GLUCOSE 82 02/16/2022   CHOL 160 02/16/2022   TRIG 209 (H) 02/16/2022   HDL 50 02/16/2022   LDLCALC 80 02/16/2022   ALT 10 02/16/2022   AST 16 02/16/2022   NA 136 02/16/2022   K 4.2 02/16/2022   CL 100 02/16/2022   CREATININE 0.74 02/16/2022   BUN 20 02/16/2022   CO2 27 02/16/2022   TSH 1.33 02/16/2022   HGBA1C 5.7 (H) 02/16/2022   MICROALBUR <0.2 06/30/2021     BNP (last 3 results) No results for input(s): "BNP" in the last 8760 hours.  ProBNP (last 3 results) No results for input(s): "PROBNP" in the last 8760 hours.   Other Studies Reviewed Today:  Echo Study Conclusions from 2015  - Left ventricle: The cavity size was normal. Wall thickness   was increased in a pattern of mild LVH. Systolic function   was normal. The estimated ejection fraction was in the   range of 60% to 65%. Wall motion was normal; there were no   regional wall motion abnormalities. Features are   consistent with a pseudonormal left ventricular filling   pattern, with concomitant abnormal relaxation and   increased filling pressure (grade 2 diastolic   dysfunction). - Aortic  valve: There was no stenosis. - Aorta: Ascending aortic diameter: 21m (S). - Ascending aorta: The ascending aorta was mildly dilated. - Mitral valve: No significant regurgitation. - Left atrium: The atrium was mildly dilated. - Right ventricle: The cavity size was normal. Systolic   function was normal. - Right atrium: The atrium was mildly dilated. - Tricuspid valve: Peak RV-RA gradient: 380mHg (S). - Pulmonary arteries: PA peak pressure: 3548mg (S). - Inferior vena cava: The vessel was  normal in size; the   respirophasic diameter changes were in the normal range (=   50%); findings are consistent with normal central venous   pressure. Impressions:  - Normal LV size and systolic function, EF 60-63%. Mild LV   hypertrophy. Moderate diastolic dysfunction. Normal RV   size and systolic function. No significant valvular   abnormalities. Mildly dilated ascending aorta.     CTA CHEST MPRESSION   04/24/19:  Stable aorta root 3.9 cm orthogonal planes    Assessment/Plan:  1. IBS:  Continue Bentyl f/u GI   2. Palpitations most recent event monitor 10/26/16 only benign PAC;s   3. Obesity - doing better weight down from fall but still not at baseline    4. History of bradycardia - stable on 1/2 dose atenolol. She does use extra prn.    5. Snoring - She has declined sleep study in the past. Declined again today despite this being a risk factor for AF.    6. Dilated aorta - most recent CT 04/24/19 only 3.8 cm   7. PAF: CHADVASC 3 no recurrence since 2015 on eliquis   8. HLD:  On statin LDL 80  f/u primary Discussed utility of coronary calcium score Non ischemic myovue 2018   9. HTN:  Well controlled.  Continue current medications and low sodium Dash type diet.    10. Ortho:  chronic knees and heel/achilles pain f/u [facility]    Calcium Score   F/U with cardiology in a year      Jenkins Rouge

## 2022-04-23 ENCOUNTER — Ambulatory Visit: Payer: Medicare Other | Admitting: Sports Medicine

## 2022-04-27 ENCOUNTER — Ambulatory Visit (INDEPENDENT_AMBULATORY_CARE_PROVIDER_SITE_OTHER): Payer: Medicare Other | Admitting: Sports Medicine

## 2022-04-27 ENCOUNTER — Encounter: Payer: Self-pay | Admitting: Sports Medicine

## 2022-04-27 DIAGNOSIS — M79671 Pain in right foot: Secondary | ICD-10-CM

## 2022-04-27 DIAGNOSIS — M79672 Pain in left foot: Secondary | ICD-10-CM | POA: Diagnosis not present

## 2022-04-27 DIAGNOSIS — M2142 Flat foot [pes planus] (acquired), left foot: Secondary | ICD-10-CM

## 2022-04-27 DIAGNOSIS — M2141 Flat foot [pes planus] (acquired), right foot: Secondary | ICD-10-CM

## 2022-04-27 DIAGNOSIS — M7661 Achilles tendinitis, right leg: Secondary | ICD-10-CM

## 2022-04-27 NOTE — Progress Notes (Signed)
Claudia [name] - 72 y.o. female MRN [id number]  Date of birth: [birth year]  Office Visit Note: Visit Date: 04/27/2022 PCP: Unk Pinto, MD Referred by: Unk Pinto, MD  Subjective: Chief Complaint  Patient presents with   Left Heel - Pain   Right Heel - Pain   HPI: Claudia [name] is a pleasant 72 y.o. female who presents today for follow-up of bilateral heel pain and achilles insertional pain.  R > L. Pain moreso over superior calcaneus, sometimes into achilles/upper calf Does feel like the combination of shockwave treatment and HEP has helped. Had soreness for the next day, but then improved. Feels she is 25% improved or so Doing HEP 1x daily in AM Wears OTC gel inserts; had orthotics years ago  Pertinent ROS were reviewed with the patient and found to be negative unless otherwise specified above in HPI.   Assessment & Plan: Visit Diagnoses:  1. Heel pain, bilateral   2. Achilles tendinitis, right leg   3. Pes planus of both feet    Plan: Discussed with [name] it is reassuring she did get some relief after the first shockwave therapy.  Through shared decision-making, did elect to repeat treatment trial today.  She will continue her home exercises daily for the calf and soleus as well as lower level eccentric exercises.  May use ice or OTC-antiinflammatories for postprocedural pain.  We discussed trialing an orthotic to help support with her pes planovalgus, at next visit we will try some green sports insoles (women's size: 12) with scaphoid pads.  She will follow-up over the next week for reevaluation.  Follow-up: Return in about 1 week (around 05/04/2022) for 1-2 weeks for bilateral heel pain (please make 30-min for b/l procedure and fit for orthotics).   Meds & Orders: No orders of the defined types were placed in this encounter.  No orders of the defined types were placed in this encounter.    Procedures: No procedures performed      Clinical  History: No specialty comments available.  She reports that she quit smoking about 18 years ago. Her smoking use included cigarettes. She has never used smokeless tobacco.  Recent Labs    06/30/21 1609 02/16/22 1432  HGBA1C 5.5 5.7*    Objective:   Vital Signs: There were no vitals taken for this visit.  Physical Exam  Gen: Well-appearing, in no acute distress; non-toxic CV: Well-perfused. Warm.  Resp: Breathing unlabored on room air; no wheezing. Psych: Fluid speech in conversation; appropriate affect; normal thought process Neuro: Sensation intact throughout. No gross coordination deficits.   Ortho Exam - Bilateral LE's: There is no redness, effusion or swelling about the ankle or feet.  Positive TTP of the superior calcaneus of the right greater than left heel.  The right heel does have a palpable deformity.  Negative calcaneal heel squeeze bilaterally.  Range of motion relatively well-preserved plantarflexion and dorsiflexion.  There is loss of longitudinal arch bilaterally upon standing.  Notable pes planovalgus.   Imaging: No results found.  Past Medical/Family/Surgical/Social History: Medications & Allergies reviewed per EMR, new medications updated. Patient Active Problem List   Diagnosis Date Noted   OAB (overactive bladder) 12/18/2019   Melasma 12/17/2019   Aortic atherosclerosis ([place]) by CTA 04/2019 06/05/2019   Osteoporosis 05/08/2018   DJD (degenerative joint disease) 05/08/2018   Thoracic aortic aneurysm ([place]) 08/13/2016   Morbid obesity ([place]) - BMI 35+ with htn, hld 03/16/2015   Atrial fibrillation ([place]) 10/29/2013   Medication  management 02/18/2013   Hypertension    Hyperlipidemia    Abnormal glucose    Vitamin D deficiency    IBS (irritable bowel syndrome)    Insomnia    Past Medical History:  Diagnosis Date   Achilles tendonitis    R heal, limits activity   Allergy    Anxiety    Elevated hemoglobin A1c    Hyperlipidemia    Hypertension    IBS  (irritable bowel syndrome)    Insomnia    Obesity    Palpitations    SVT (supraventricular tachycardia) 10/29/2013   Vitamin D deficiency    Family History  Problem Relation Age of Onset   Heart attack Mother    Stroke Mother    Heart disease Mother    Diabetes Mother    Macular degeneration Mother    Heart attack Father    Alcohol abuse Father    Ovarian cancer Maternal Grandmother    Colon cancer Neg Hx    Past Surgical History:  Procedure Laterality Date   CESAREAN SECTION     CESAREAN SECTION     CHOLECYSTECTOMY  10 years ago   TUBAL LIGATION Bilateral    Social History   Occupational History   Not on file  Tobacco Use   Smoking status: Former    Types: Cigarettes    Quit date: 04/01/2004    Years since quitting: 18.0   Smokeless tobacco: Never   Tobacco comments:    Social smoker times 10 years  Vaping Use   Vaping Use: Never used  Substance and Sexual Activity   Alcohol use: Yes    Alcohol/week: 0.0 standard drinks of alcohol    Comment: rarely, < 1 beer per week   Drug use: No   Sexual activity: Not on file

## 2022-04-27 NOTE — Progress Notes (Signed)
Feels like first treatment helped; as well as the stretches

## 2022-05-04 ENCOUNTER — Encounter: Payer: Self-pay | Admitting: Sports Medicine

## 2022-05-04 ENCOUNTER — Ambulatory Visit (INDEPENDENT_AMBULATORY_CARE_PROVIDER_SITE_OTHER): Payer: Medicare Other | Admitting: Sports Medicine

## 2022-05-04 DIAGNOSIS — M2142 Flat foot [pes planus] (acquired), left foot: Secondary | ICD-10-CM | POA: Diagnosis not present

## 2022-05-04 DIAGNOSIS — M7661 Achilles tendinitis, right leg: Secondary | ICD-10-CM

## 2022-05-04 DIAGNOSIS — M79672 Pain in left foot: Secondary | ICD-10-CM | POA: Diagnosis not present

## 2022-05-04 DIAGNOSIS — M2141 Flat foot [pes planus] (acquired), right foot: Secondary | ICD-10-CM

## 2022-05-04 DIAGNOSIS — M79671 Pain in right foot: Secondary | ICD-10-CM

## 2022-05-04 NOTE — Progress Notes (Signed)
Does not feel any different since last visit

## 2022-05-04 NOTE — Progress Notes (Signed)
Claudia [name] - 72 y.o. female MRN [id number]  Date of birth: [birth year]  Office Visit Note: Visit Date: 05/04/2022 PCP: Unk Pinto, MD Referred by: Unk Pinto, MD  Subjective: Chief Complaint  Patient presents with   Left Heel - Follow-up   Right Heel - Follow-up   HPI: Claudia [name] is a pleasant 72 y.o. female who presents today for bilateral heel pain and Achilles insertional pain.  After second trial of ECSWT - didn't feel like the treatment helped. First trial she noted about 25% improvement or so. She continues with HEP 1x daily - small benefit Pain continues to be localized more so over the posterior aspect of the superior calcaneus, right greater than left. Notices her pain is more so after she is active and walking more part of the days.  When she sits down then to rest and then get back up her pain is more bothersome. She is interested in trying the sports insoles with modification today.  Pertinent ROS were reviewed with the patient and found to be negative unless otherwise specified above in HPI.   Assessment & Plan: Visit Diagnoses:  1. Pes planus of both feet   2. Heel pain, bilateral   3. Achilles tendinitis, right leg    Plan: Discussed with [name] today the plan for her bilateral heel and insertional achilles pain.  She did get some relief after the first shockwave treatment, however after the last she did not feel it helped at all.  More of her pain is over the actual bone upon palpation at the superior calcaneus and not as much near the tendinous portion of the achilles.  We did give her 1 additional trial of shockwave therapy, she will see over the next 2 weeks what sort of improvement she gets.  If she is at least 25% better and noted a difference with the shockwave, we will bring her back for shockwave treatment visit only (shoot for somewhere between 5-7 sessions), however if she is not finding benefit, I do not think it would be wise to  continue shockwave and I would like to have her see my partner, Dr. Sharol Given, to see what other options (surgical vs. Non-op) are available.  We did fit her for green sports insoles today with medium scaphoid pad and attempt to support her loss of longitudinal arch and pes planovalgus.  Gait analysis following this found him to be comfortable and did correct against her overpronation.  She will call or message me in the next 10-14 days to update me on her progress and decide next steps.  Follow-up: Return for She will message or call me in 2-weeks if she wishes to continue with shockwave treatments.   Meds & Orders: No orders of the defined types were placed in this encounter.  No orders of the defined types were placed in this encounter.    Procedures: Procedure: ECSWT Indications: Heel spur, Achilles tendinopathy   Procedure Details Consent: Risks of procedure as well as the alternatives and risks of each were explained to the patient.  Verbal consent for procedure obtained. Time Out: Verified patient identification, verified procedure, site was marked, verified correct patient position. The area was cleaned with alcohol swab.     The left calcaneus and Achilles tendon was targeted for Extracorporeal shockwave therapy.    Preset: Heel spur Power Level: 100 Frequency: 10 Hz Impulse/cycles: 2500 Head size: Regular   Patient tolerated procedure well without immediate complications.   The right calcaneus  and Achilles tendon was targeted for Extracorporeal shockwave therapy.    Preset: Heel spur Power Level: 50-70 Frequency: 6-8 Hz Impulse/cycles: 2000 Head size: Regular   Patient tolerated procedure well without immediate complications.      Clinical History: No specialty comments available.  She reports that she quit smoking about 18 years ago. Her smoking use included cigarettes. She has never used smokeless tobacco.  Recent Labs    06/30/21 1609 02/16/22 1432  HGBA1C 5.5 5.7*     Objective:    Physical Exam  Gen: Well-appearing, in no acute distress; non-toxic CV: Well-perfused. Warm.  Resp: Breathing unlabored on room air; no wheezing. Psych: Fluid speech in conversation; appropriate affect; normal thought process Neuro: Sensation intact throughout. No gross coordination deficits.   Ortho Exam - Bilateral feet: Positive TTP near the posterior aspect of the superior calcaneus, right greater than left.  There is no overlying redness, effusion or swelling in this region.  There is a palpable bony deformity of the superior aspect of the calcaneus on the right.  Negative calcaneal heel squeeze bilaterally.  Full active and passive range of motion about the ankle in all directions.  Upon standing there is loss of longitudinal arch bilaterally.  There is notable pes planovalgus with excessive supination, right greater than left through walking phase.  Imaging: No results found.  Past Medical/Family/Surgical/Social History: Medications & Allergies reviewed per EMR, new medications updated. Patient Active Problem List   Diagnosis Date Noted   OAB (overactive bladder) 12/18/2019   Melasma 12/17/2019   Aortic atherosclerosis ([place]) by CTA 04/2019 06/05/2019   Osteoporosis 05/08/2018   DJD (degenerative joint disease) 05/08/2018   Thoracic aortic aneurysm ([place]) 08/13/2016   Morbid obesity ([place]) - BMI 35+ with htn, hld 03/16/2015   Atrial fibrillation ([place]) 10/29/2013   Medication management 02/18/2013   Hypertension    Hyperlipidemia    Abnormal glucose    Vitamin D deficiency    IBS (irritable bowel syndrome)    Insomnia    Past Medical History:  Diagnosis Date   Achilles tendonitis    R heal, limits activity   Allergy    Anxiety    Elevated hemoglobin A1c    Hyperlipidemia    Hypertension    IBS (irritable bowel syndrome)    Insomnia    Obesity    Palpitations    SVT (supraventricular tachycardia) 10/29/2013   Vitamin D deficiency    Family  History  Problem Relation Age of Onset   Heart attack Mother    Stroke Mother    Heart disease Mother    Diabetes Mother    Macular degeneration Mother    Heart attack Father    Alcohol abuse Father    Ovarian cancer Maternal Grandmother    Colon cancer Neg Hx    Past Surgical History:  Procedure Laterality Date   CESAREAN SECTION     CESAREAN SECTION     CHOLECYSTECTOMY  10 years ago   TUBAL LIGATION Bilateral    Social History   Occupational History   Not on file  Tobacco Use   Smoking status: Former    Types: Cigarettes    Quit date: 04/01/2004    Years since quitting: 18.1   Smokeless tobacco: Never   Tobacco comments:    Social smoker times 10 years  Vaping Use   Vaping Use: Never used  Substance and Sexual Activity   Alcohol use: Yes    Alcohol/week: 0.0 standard drinks of alcohol  Comment: rarely, < 1 beer per week   Drug use: No   Sexual activity: Not on file   I spent 34 minutes in the care of the patient today including face-to-face time, preparation to see the patient, as well as review of previous x-rays, counseling and treating the patient on home exercise plan and therapy, and performing gait analysis for the above diagnoses.   Elba Barman, DO Primary Care Sports Medicine Physician  [place]  This note was dictated using Dragon naturally speaking software and may contain errors in syntax, spelling, or content which have not been identified prior to signing this note.

## 2022-05-05 ENCOUNTER — Encounter: Payer: Self-pay | Admitting: Cardiovascular Disease

## 2022-05-05 ENCOUNTER — Ambulatory Visit: Payer: Medicare Other | Attending: Cardiovascular Disease | Admitting: Cardiovascular Disease

## 2022-05-05 VITALS — BP 130/70 | HR 60 | Ht 68.0 in | Wt 242.0 lb

## 2022-05-05 DIAGNOSIS — E785 Hyperlipidemia, unspecified: Secondary | ICD-10-CM | POA: Diagnosis not present

## 2022-05-05 DIAGNOSIS — R002 Palpitations: Secondary | ICD-10-CM | POA: Diagnosis not present

## 2022-05-05 DIAGNOSIS — R001 Bradycardia, unspecified: Secondary | ICD-10-CM | POA: Diagnosis not present

## 2022-05-05 DIAGNOSIS — I7123 Aneurysm of the descending thoracic aorta, without rupture: Secondary | ICD-10-CM | POA: Insufficient documentation

## 2022-05-05 DIAGNOSIS — I10 Essential (primary) hypertension: Secondary | ICD-10-CM | POA: Diagnosis not present

## 2022-05-05 NOTE — Patient Instructions (Signed)
Medication Instructions:  Your physician recommends that you continue on your current medications as directed. Please refer to the Current Medication list given to you today.  *If you need a refill on your cardiac medications before your next appointment, please call your pharmacy*  Lab Work: If you have labs (blood work) drawn today and your tests are completely normal, you will receive your results only by: [place] [place] (if you have MyChart) OR A paper copy in the mail If you have any lab test that is abnormal or we need to change your treatment, we will call you to review the results.  Testing/Procedures: Cardiac CT scanning for calcium score, (CAT scanning), is a noninvasive, special x-ray that produces cross-sectional images of the body using x-rays and a computer. CT scans help physicians diagnose and treat medical conditions. For some CT exams, a contrast material is used to enhance visibility in the area of the body being studied. CT scans provide greater clarity and reveal more details than regular x-ray exams.   Follow-Up: At [facility], you and your health needs are our priority.  As part of our continuing mission to provide you with exceptional heart care, we have created designated Provider Care Teams.  These Care Teams include your primary Cardiologist (physician) and Advanced Practice Providers (APPs -  Physician Assistants and Nurse Practitioners) who all work together to provide you with the care you need, when you need it.  We recommend signing up for the patient portal called "MyChart".  Sign up information is provided on this After Visit Summary.  MyChart is used to connect with patients for Virtual Visits (Telemedicine).  Patients are able to view lab/test results, encounter notes, upcoming appointments, etc.  Non-urgent messages can be sent to your provider as well.   To learn more about what you can do with MyChart, go to [email or web address].    Your next  appointment:   1 year(s)  Provider:   Jenkins Rouge, MD

## 2022-05-24 ENCOUNTER — Telehealth: Payer: Self-pay | Admitting: Sports Medicine

## 2022-05-24 NOTE — Telephone Encounter (Signed)
Patient called advised right heel is not any better and would like to be referred per conversation if last visit to one of the other providers.  Patient said she might need surgery. The number to contact patient is [phone]

## 2022-05-25 ENCOUNTER — Other Ambulatory Visit: Payer: Self-pay | Admitting: Sports Medicine

## 2022-05-25 DIAGNOSIS — M2141 Flat foot [pes planus] (acquired), right foot: Secondary | ICD-10-CM

## 2022-05-27 ENCOUNTER — Encounter: Payer: Self-pay | Admitting: Internal Medicine

## 2022-06-01 ENCOUNTER — Other Ambulatory Visit: Payer: Self-pay | Admitting: Cardiovascular Disease

## 2022-06-01 DIAGNOSIS — I480 Paroxysmal atrial fibrillation: Secondary | ICD-10-CM

## 2022-06-01 NOTE — Telephone Encounter (Signed)
Pt last saw Dr [name] 05/05/22, last labs 02/16/22 Creat 0.74, age 72, weight 109.8kg, based on specified criteria pt is on appropriate dosage of Eliquis '5mg'$  BID for afib.  Will refill rx.

## 2022-06-02 ENCOUNTER — Encounter (HOSPITAL_BASED_OUTPATIENT_CLINIC_OR_DEPARTMENT_OTHER): Payer: Self-pay

## 2022-06-02 ENCOUNTER — Ambulatory Visit (HOSPITAL_BASED_OUTPATIENT_CLINIC_OR_DEPARTMENT_OTHER)
Admission: RE | Admit: 2022-06-02 | Discharge: 2022-06-02 | Disposition: A | Discharge status: Home / Self Care | Payer: Medicare Other | Source: Ambulatory Visit | Attending: Cardiovascular Disease | Admitting: Cardiovascular Disease

## 2022-06-02 ENCOUNTER — Other Ambulatory Visit: Payer: Self-pay

## 2022-06-02 DIAGNOSIS — I7123 Aneurysm of the descending thoracic aorta, without rupture: Secondary | ICD-10-CM

## 2022-06-02 DIAGNOSIS — R002 Palpitations: Secondary | ICD-10-CM

## 2022-06-02 DIAGNOSIS — R001 Bradycardia, unspecified: Secondary | ICD-10-CM

## 2022-06-02 DIAGNOSIS — E785 Hyperlipidemia, unspecified: Secondary | ICD-10-CM

## 2022-06-02 DIAGNOSIS — I10 Essential (primary) hypertension: Secondary | ICD-10-CM

## 2022-06-02 MED ORDER — PRAVASTATIN SODIUM 20 MG PO TABS: 20.0000 mg | ORAL_TABLET | Freq: Every day | ORAL | 3 refills | Status: DC

## 2022-06-02 NOTE — Progress Notes (Signed)
Patient will start taking her pravastatin daily instead of twice daily.

## 2022-06-07 ENCOUNTER — Encounter: Payer: Self-pay | Admitting: Cardiovascular Disease

## 2022-06-07 MED ORDER — PRAVASTATIN SODIUM 20 MG PO TABS: 20.0000 mg | ORAL_TABLET | Freq: Every day | ORAL | 3 refills | Status: DC

## 2022-06-10 ENCOUNTER — Ambulatory Visit (INDEPENDENT_AMBULATORY_CARE_PROVIDER_SITE_OTHER): Payer: Medicare Other | Admitting: Orthopedic Surgery

## 2022-06-10 ENCOUNTER — Encounter: Payer: Self-pay | Admitting: Orthopedic Surgery

## 2022-06-10 DIAGNOSIS — M7661 Achilles tendinitis, right leg: Secondary | ICD-10-CM | POA: Diagnosis not present

## 2022-06-10 DIAGNOSIS — M6701 Short Achilles tendon (acquired), right ankle: Secondary | ICD-10-CM

## 2022-06-10 NOTE — Progress Notes (Signed)
Office Visit Note   Patient: Claudia [name]           Date of Birth: [birth year]           MRN: [id number] Visit Date: 06/10/2022              Requested by: Elba Barman, [address] [place],  [place] [address] PCP: Unk Pinto, MD  Chief Complaint  Patient presents with   Right Foot - Pain      HPI: Patient is a 72 year old woman is seen in follow-up for insertional Achilles tendinitis with Achilles contracture on the right.  Patient has undergone shockwave therapy with some minimal relief.  She states she has tried heel lifts she has tried Voltaren gel.  She states that she has done stretching with her chiropractor.  Assessment & Plan: Visit Diagnoses:  1. Achilles tendinitis, right leg   2. Contracture of right Achilles tendon     Plan: Recommended continue Achilles stretching.  Patient was given 916 since heel lift to unload the Achilles during ambulation.  Recommended fascial strengthening and this was demonstrated.  Discussed that if she fails conservative therapy we could proceed with a gastrocnemius recession and Haglund's recession.  Discussed the risks of surgery including Achilles rupture and wound healing complications as well as persistent pain.  Patient states she understands she will call as needed.  Follow-Up Instructions: No follow-ups on file.   Ortho Exam  Patient is alert, oriented, no adenopathy, well-dressed, normal affect, normal respiratory effort. Examination patient has a good dorsalis pedis pulse with her knee extended she has dorsiflexion only to neutral she has a cavovarus foot.  She has pain to palpation of the insertion of the Achilles there is no nodules no palpable defect no redness.  Imaging: No results found. No images are attached to the encounter.  Labs: Lab Results  Component Value Date   HGBA1C 5.7 (H) 02/16/2022   HGBA1C 5.5 06/30/2021   HGBA1C 5.5 06/25/2020   ESRSEDRATE 11 10/11/2016     Lab Results  Component  Value Date   ALBUMIN 4.1 10/11/2016   ALBUMIN 3.9 08/17/2016   ALBUMIN 3.7 11/04/2015    Lab Results  Component Value Date   MG 1.9 02/16/2022   MG 2.1 06/30/2021   MG 2.1 11/10/2020   Lab Results  Component Value Date   VD25OH 82 02/16/2022   VD25OH 76 06/30/2021   VD25OH 96 06/25/2020    No results found for: "PREALBUMIN"    Latest Ref Rng & Units 02/16/2022    2:32 PM 07/31/2021    9:56 PM 06/30/2021    4:09 PM  CBC EXTENDED  WBC 3.8 - 10.8 Thousand/uL 7.0  6.8  6.0   RBC 3.80 - 5.10 Million/uL 4.49  4.64  4.40   Hemoglobin 11.7 - 15.5 g/dL 13.2  13.3  12.8   HCT 35.0 - 45.0 % 39.2  41.0  38.7   Platelets 140 - 400 Thousand/uL 274  275  259   NEUT# 1,500 - 7,800 cells/uL 4,431   3,372   Lymph# 850 - 3,900 cells/uL 1,799   1,764      There is no height or weight on file to calculate BMI.  Orders:  No orders of the defined types were placed in this encounter.  No orders of the defined types were placed in this encounter.    Procedures: No procedures performed  Clinical Data: No additional findings.  ROS:  All other systems negative, except  as noted in the HPI. Review of Systems  Objective: Vital Signs: There were no vitals taken for this visit.  Specialty Comments:  No specialty comments available.  PMFS History: Patient Active Problem List   Diagnosis Date Noted   OAB (overactive bladder) 12/18/2019   Melasma 12/17/2019   Aortic atherosclerosis ([place]) by CTA 04/2019 06/05/2019   Osteoporosis 05/08/2018   DJD (degenerative joint disease) 05/08/2018   Thoracic aortic aneurysm ([place]) 08/13/2016   Morbid obesity ([place]) - BMI 35+ with htn, hld 03/16/2015   Atrial fibrillation ([place]) 10/29/2013   Medication management 02/18/2013   Hypertension    Hyperlipidemia    Abnormal glucose    Vitamin D deficiency    IBS (irritable bowel syndrome)    Insomnia    Past Medical History:  Diagnosis Date   Achilles tendonitis    R heal, limits activity    Allergy    Anxiety    Elevated hemoglobin A1c    Hyperlipidemia    Hypertension    IBS (irritable bowel syndrome)    Insomnia    Obesity    Palpitations    SVT (supraventricular tachycardia) 10/29/2013   Vitamin D deficiency     Family History  Problem Relation Age of Onset   Heart attack Mother    Stroke Mother    Heart disease Mother    Diabetes Mother    Macular degeneration Mother    Heart attack Father    Alcohol abuse Father    Ovarian cancer Maternal Grandmother    Colon cancer Neg Hx     Past Surgical History:  Procedure Laterality Date   CESAREAN SECTION     CESAREAN SECTION     CHOLECYSTECTOMY  10 years ago   TUBAL LIGATION Bilateral    Social History   Occupational History   Not on file  Tobacco Use   Smoking status: Former    Types: Cigarettes    Quit date: 04/01/2004    Years since quitting: 18.2   Smokeless tobacco: Never   Tobacco comments:    Social smoker times 10 years  Vaping Use   Vaping Use: Never used  Substance and Sexual Activity   Alcohol use: Yes    Alcohol/week: 0.0 standard drinks of alcohol    Comment: rarely, < 1 beer per week   Drug use: No   Sexual activity: Not on file

## 2022-07-05 ENCOUNTER — Encounter: Payer: Self-pay | Admitting: Internal Medicine

## 2022-07-05 NOTE — Patient Instructions (Signed)

## 2022-07-05 NOTE — Progress Notes (Signed)
Comprehensive Evaluation &  Examination   Future Appointments  Date Time Provider Department  07/06/2022                       cpe  2:00 PM Lucky Cowboy, MD [facility]  11/11/2022                       wellness  3:00 PM Adela Glimpse, NP [facility]        This very nice 72 y.o. MWF  with   HTN, HLD, Prediabetes  and Vitamin D Deficiency  presents for comprehensive evaluation and management of multiple medical co-morbidities.          HTN predates since 1996.  In 2004, patient was started on Eliquis when dx'd w/pAfib.  Patient follows annually with Dr Eden Emms.  CTA in 2019 found an ascending Thoracic Aortic Aneurysm which is followed annually by Dr Eden Emms. Patient's BP has been occasionally elevated at home and patient denies any cardiac symptoms as chest pain, palpitations, shortness of breath, dizziness or ankle swelling. Today's BP  was initially elevated & rechecked at goal -  130/74.        Patient's hyperlipidemia is controlled with diet and Pravastatin. Patient denies myalgias or other medication SE's. Last lipids were at goal except elevated Trig's :  Lab Results  Component Value Date   CHOL 160 02/16/2022   HDL 50 02/16/2022   LDLCALC 80 02/16/2022   TRIG 209 (H) 02/16/2022   CHOLHDL 3.2 02/16/2022         Patient has moderate obesity  ( BMI 39+) and hx/o prediabetes  (A1c 5.8% /2012) and patient denies reactive hypoglycemic symptoms, visual blurring, diabetic polys or paresthesias. Last A1c was near goal :  Lab Results  Component Value Date   HGBA1C 5.7 (H) 02/16/2022         Finally, patient has history of Vitamin D Deficiency ("18" /2008) and last Vitamin D was at goal :  Lab Results  Component Value Date   VD25OH 82 02/16/2022       Current Outpatient Medications  Medication Instructions   VITAMIN C 2 times daily   VITAMIN D  8,000 Units  Daily   CINNAMON    1,000 mg Daily   VITAMIN B12   1,000 mcg Daily   diclofenac   4 g, Topical  4 times daily  PRN   DiphenhydrAMINE  Daily   Eliquis  5 mg  2 times daily   Hydrochlorothiazide 25 MG tablet TAKE 1 TABLET DAILY    loperamide (IMODIUM) 4 mg  Daily   Menaquinone-7 - VITAMIN K2  Daily   metoprolol succinate  XL 12.5 mg  Daily   oxybutynin XL 10 MG  Take  1 tablet  at Suppertime     pantoprazole 40 MG tablet Take  1 tablet  Daily  to Prevent Heartburn &  Indigestion   pravastatin    20 mg  Daily   tretinoin (RETIN-A) 0.05 % cream Topical, Daily at bedtime   valsartan 320 MG tablet TAKE 1 TABLET AT NIGHT       Allergies  Allergen Reactions   Ciprofloxacin    Levaquin [Levofloxacin In D5w]     No fluroquinolones due to ascending thoracic aortic aneurysm   Penicillins Other (See Comments)    Had as a child=unknown reaction   Zoloft [Sertraline Hcl]      Past Medical History:  Diagnosis Date  Achilles tendonitis    R heel, limits activity   Allergy    Anxiety    Elevated hemoglobin A1c    Hyperlipidemia    Hypertension    IBS (irritable bowel syndrome)    Insomnia    Obesity    Palpitations    SVT (supraventricular tachycardia) ([facility]) 10/29/2013   Vitamin D deficiency      Health Maintenance  Topic Date Due   Zoster Vaccines- Shingrix (1 of 2) Never done   COVID-19 Vaccine (5 - Booster for Pfizer series) 10/21/2020   MAMMOGRAM  05/06/2023   DEXA SCAN  05/09/2023   TETANUS/TDAP  10/26/2026   Pneumonia Vaccine 44+ Years old  Completed   INFLUENZA VACCINE  Completed   Hepatitis C Screening  Completed   HPV VACCINES  Aged Out     Immunization History  Administered Date(s) Administered   Fluad Quad(high Dose) 12/15/2018, 01/22/2020   Influenza Split 02/19/2013   Influenza, High Dose  01/25/2018   Influenza, Seasonal 03/12/2015   Influenza,inj,Quad   01/27/2017   Influenza  04/06/2011   PFIZER SARS-COV-2 Vacc  04/27/2019, 05/18/2019,  12/14/2019, 08/26/2020   PPD Test 10/15/2013   Pneumococcal -13 01/25/2018   Pneumococcal -23 09/13/2019   Pneumococcal  -23 04/06/1995   Td 10/25/2016   Tdap 04/05/2006   Zoster, Live 07/04/2013    Last Colon - 11/03/2012 - Dr Lina Sar - due 10 year f/u - Aug 2024   Last MGM - 05/05/2021  Last dexaBMD - 05/08/2021 with T = -1.8 -> Osteopenia & recc 2 year  f/u due Feb 2025   Past Surgical History:  Procedure Laterality Date   CESAREAN SECTION     CESAREAN SECTION     CHOLECYSTECTOMY  10 years ago   TUBAL LIGATION Bilateral      Family History  Problem Relation Age of Onset   Heart attack Mother    Stroke Mother    Heart disease Mother    Diabetes Mother    Macular degeneration Mother    Heart attack Father    Alcohol abuse Father    Ovarian cancer Maternal Grandmother    Colon cancer Neg Hx      Social History   Tobacco Use   Smoking status: Former    Types: Cigarettes    Quit date: 04/01/2004    Years since quitting: 17.2   Smokeless tobacco: Never   Tobacco comments:    Social smoker times 10 years  Vaping Use   Vaping Use: Never used  Substance Use Topics   Alcohol use: Yes    Alcohol/week: 0.0 standard drinks    Comment: rarely, < 1 beer per week   Drug use: No      ROS Constitutional: Denies fever, chills, weight loss/gain, headaches, insomnia,  night sweats, and change in appetite. Does c/o fatigue. Eyes: Denies redness, blurred vision, diplopia, discharge, itchy, watery eyes.  ENT: Denies discharge, congestion, post nasal drip, epistaxis, sore throat, earache, hearing loss, dental pain, Tinnitus, Vertigo, Sinus pain, snoring.  Cardio: Denies chest pain, palpitations, irregular heartbeat, syncope, dyspnea, diaphoresis, orthopnea, PND, claudication, edema Respiratory: denies cough, dyspnea, DOE, pleurisy, hoarseness, laryngitis, wheezing.  Gastrointestinal: Denies dysphagia, heartburn, reflux, water brash, pain, cramps, nausea, vomiting, bloating, diarrhea, constipation, hematemesis, melena, hematochezia, jaundice, hemorrhoids Genitourinary: Denies dysuria,  frequency, urgency, nocturia, hesitancy, discharge, hematuria, flank pain Breast: Breast lumps, nipple discharge, bleeding.  Musculoskeletal: Denies arthralgia, myalgia, stiffness, Jt. Swelling, pain, limp, and strain/sprain. Denies falls. Skin: Denies puritis, rash, hives,  warts, acne, eczema, changing in skin lesion Neuro: No weakness, tremor, incoordination, spasms, paresthesia, pain Psychiatric: Denies confusion, memory loss, sensory loss. Denies Depression. Endocrine: Denies change in weight, skin, hair change, nocturia, and paresthesia, diabetic polys, visual blurring, hyper / hypo glycemic episodes.  Heme/Lymph: No excessive bleeding, bruising, enlarged lymph nodes.  Physical Exam  BP 130/74   Pulse (!) 58   Temp 97.9 F (36.6 C)   Resp 16   Wt 244 lb (110.7 kg)   SpO2 98%   BMI 37.10 kg/m   General Appearance: Over nourished, well groomed and in no apparent distress.  Eyes: PERRLA, EOMs, conjunctiva no swelling or erythema, normal fundi and vessels. Sinuses: No frontal/maxillary tenderness ENT/Mouth: EACs patent / TMs  nl. Nares clear without erythema, swelling, mucoid exudates. Oral hygiene is good. No erythema, swelling, or exudate. Tongue normal, non-obstructing. Tonsils not swollen or erythematous. Hearing normal.  Neck: Supple, thyroid not palpable. No bruits, nodes or JVD. Respiratory: Respiratory effort normal.  BS equal and clear bilateral without rales, rhonci, wheezing or stridor. Cardio: Heart sounds are normal with regular rate and rhythm and no murmurs, rubs or gallops. Peripheral pulses are normal and equal bilaterally without edema. No aortic or femoral bruits. Chest: symmetric with normal excursions and percussion. Breasts: Symmetric, without lumps, nipple discharge, retractions, or fibrocystic changes.  Abdomen: Flat, soft with bowel sounds active. Nontender, no guarding, rebound, hernias, masses, or organomegaly.  Lymphatics: Non tender without  lymphadenopathy.  Musculoskeletal: Full ROM all peripheral extremities, joint stability, 5/5 strength, and normal gait. Skin: Warm and dry without rashes, lesions, cyanosis, clubbing or  ecchymosis.  Neuro: Cranial nerves intact, reflexes equal bilaterally. Normal muscle tone, no cerebellar symptoms. Sensation intact.  Pysch: Alert and oriented X 3, normal affect, Insight and Judgment appropriate.    Assessment and Plan   1. Essential hypertension  - Replace Valsartan with Olmesartan   - EKG 12-Lead - Urinalysis, Routine w reflex microscopic - Microalbumin / creatinine urine ratio - CBC with Differential/Platelet - COMPLETE METABOLIC PANEL WITH GFR - Magnesium - TSH  2. Hyperlipidemia, mixed  - EKG 12-Lead - Lipid panel - TSH  3. Abnormal glucose  - EKG 12-Lead - Hemoglobin A1c - Insulin, random  4. Aortic atherosclerosis ([facility])  - EKG 12-Lead - Lipid panel  5. Vitamin D deficiency  - VITAMIN D 25 Hydroxy   6. Paroxysmal atrial fibrillation ([facility])  - TSH  7. Morbid obesity ([facility]) - BMI 35+ with htn, hld  - TSH  8. Thoracic aortic aneurysm   - EKG 12-Lead - Lipid panel  9. Screening for colorectal cancer  - EKG 12-Lead - POC Hemoccult Bld/Stl  10. Screening for ischemic heart disease  - EKG 12-Lead  11. FHx: heart disease  - EKG 12-Lead  12. Former smoker  - EKG 12-Lead  13. Medication management  - Urinalysis, Routine w reflex microscopic - Microalbumin / creatinine urine ratio - CBC with Differential/Platelet - COMPLETE METABOLIC PANEL WITH GFR - Magnesium - Lipid panel - TSH - Hemoglobin A1c - Insulin, random - VITAMIN D 25 Hydroxy          Patient was counseled in prudent diet to achieve/maintain BMI less than 25 for weight control, BP monitoring, regular exercise and medications. Discussed med's effects and SE's. Screening labs and tests as requested with regular follow-up as recommended. Over 40 minutes of exam, counseling,  chart review and high complex critical decision making was performed.   Marinus Maw, MD

## 2022-07-06 ENCOUNTER — Ambulatory Visit (INDEPENDENT_AMBULATORY_CARE_PROVIDER_SITE_OTHER): Payer: Medicare Other | Admitting: Internal Medicine

## 2022-07-06 ENCOUNTER — Encounter: Payer: Self-pay | Admitting: Internal Medicine

## 2022-07-06 VITALS — BP 130/74 | HR 58 | Temp 97.9°F | Resp 16 | Ht 68.0 in | Wt 244.0 lb

## 2022-07-06 DIAGNOSIS — Z87891 Personal history of nicotine dependence: Secondary | ICD-10-CM | POA: Diagnosis not present

## 2022-07-06 DIAGNOSIS — E782 Mixed hyperlipidemia: Secondary | ICD-10-CM | POA: Diagnosis not present

## 2022-07-06 DIAGNOSIS — I10 Essential (primary) hypertension: Secondary | ICD-10-CM

## 2022-07-06 DIAGNOSIS — Z79899 Other long term (current) drug therapy: Secondary | ICD-10-CM

## 2022-07-06 DIAGNOSIS — Z8249 Family history of ischemic heart disease and other diseases of the circulatory system: Secondary | ICD-10-CM

## 2022-07-06 DIAGNOSIS — I700 Atherosclerosis of aorta: Secondary | ICD-10-CM

## 2022-07-06 DIAGNOSIS — Z136 Encounter for screening for cardiovascular disorders: Secondary | ICD-10-CM

## 2022-07-06 DIAGNOSIS — E559 Vitamin D deficiency, unspecified: Secondary | ICD-10-CM | POA: Diagnosis not present

## 2022-07-06 DIAGNOSIS — I712 Thoracic aortic aneurysm, without rupture, unspecified: Secondary | ICD-10-CM

## 2022-07-06 DIAGNOSIS — R7309 Other abnormal glucose: Secondary | ICD-10-CM | POA: Diagnosis not present

## 2022-07-06 DIAGNOSIS — Z1211 Encounter for screening for malignant neoplasm of colon: Secondary | ICD-10-CM

## 2022-07-06 DIAGNOSIS — I480 Paroxysmal atrial fibrillation: Secondary | ICD-10-CM

## 2022-07-06 MED ORDER — OLMESARTAN MEDOXOMIL 40 MG PO TABS
ORAL_TABLET | ORAL | 3 refills | Status: DC
Start: 2022-07-06 — End: 2023-01-11

## 2022-07-07 ENCOUNTER — Encounter: Payer: Self-pay | Admitting: Cardiovascular Disease

## 2022-07-07 LAB — COMPLETE METABOLIC PANEL WITH GFR
AG Ratio: 1.4 (calc) (ref 1.0–2.5)
ALT: 16 U/L (ref 6–29)
AST: 18 U/L (ref 10–35)
Albumin: 4.2 g/dL (ref 3.6–5.1)
Alkaline phosphatase (APISO): 66 U/L (ref 37–153)
BUN: 14 mg/dL (ref 7–25)
CO2: 28 mmol/L (ref 20–32)
Calcium: 9.4 mg/dL (ref 8.6–10.4)
Chloride: 102 mmol/L (ref 98–110)
Creat: 0.67 mg/dL (ref 0.50–1.05)
Globulin: 3 g/dL (calc) (ref 1.9–3.7)
Glucose, Bld: 81 mg/dL (ref 65–99)
Potassium: 4.5 mmol/L (ref 3.5–5.3)
Sodium: 138 mmol/L (ref 135–146)
Total Bilirubin: 0.6 mg/dL (ref 0.2–1.2)
Total Protein: 7.2 g/dL (ref 6.1–8.1)
eGFR: 95 mL/min/{1.73_m2} (ref >=60)

## 2022-07-07 LAB — CBC WITH DIFFERENTIAL/PLATELET
Absolute Monocytes: 594 cells/uL (ref 200–950)
Basophils Absolute: 28 cells/uL (ref 0–200)
Basophils Relative: 0.5 %
Eosinophils Absolute: 129 cells/uL (ref 15–500)
Eosinophils Relative: 2.3 %
HCT: 37.6 % (ref 35.0–45.0)
Hemoglobin: 12.4 g/dL (ref 11.7–15.5)
Lymphs Abs: 1702.4 cells/uL (ref 850–3900)
MCH: 29 pg (ref 27.0–33.0)
MCHC: 33 g/dL (ref 32.0–36.0)
MCV: 88.1 fL (ref 80.0–100.0)
MPV: 11.1 fL (ref 7.5–12.5)
Monocytes Relative: 10.6 %
Neutro Abs: 3147 cells/uL (ref 1500–7800)
Neutrophils Relative %: 56.2 %
Platelets: 267 10*3/uL (ref 140–400)
RBC: 4.27 10*6/uL (ref 3.80–5.10)
RDW: 12.9 % (ref 11.0–15.0)
Total Lymphocyte: 30.4 %
WBC: 5.6 10*3/uL (ref 3.8–10.8)

## 2022-07-07 LAB — LIPID PANEL
Cholesterol: 130 mg/dL (ref <200)
HDL: 48 mg/dL — ABNORMAL LOW (ref >=50)
LDL Cholesterol (Calc): 60 mg/dL (calc)
Non-HDL Cholesterol (Calc): 82 mg/dL (calc) (ref <130)
Total CHOL/HDL Ratio: 2.7 (calc) (ref <5.0)
Triglycerides: 139 mg/dL (ref <150)

## 2022-07-07 LAB — URINALYSIS, ROUTINE W REFLEX MICROSCOPIC
Bilirubin Urine: NEGATIVE
Glucose, UA: NEGATIVE
Hgb urine dipstick: NEGATIVE
Ketones, ur: NEGATIVE
Leukocytes,Ua: NEGATIVE
Nitrite: NEGATIVE
Protein, ur: NEGATIVE
Specific Gravity, Urine: 1.008 (ref 1.001–1.035)
pH: 5.5 (ref 5.0–8.0)

## 2022-07-07 LAB — MAGNESIUM: Magnesium: 2 mg/dL (ref 1.5–2.5)

## 2022-07-07 LAB — TSH: TSH: 1.01 mIU/L (ref 0.40–4.50)

## 2022-07-07 LAB — MICROALBUMIN / CREATININE URINE RATIO
Creatinine, Urine: 36 mg/dL (ref 20–275)
Microalb, Ur: <0.2 mg/dL

## 2022-07-07 LAB — INSULIN, RANDOM: Insulin: 8.2 u[IU]/mL

## 2022-07-07 LAB — VITAMIN D 25 HYDROXY (VIT D DEFICIENCY, FRACTURES): Vit D, 25-Hydroxy: 90 ng/mL (ref 30–100)

## 2022-07-07 LAB — HEMOGLOBIN A1C
Hgb A1c MFr Bld: 5.8 % of total Hgb — ABNORMAL HIGH (ref <5.7)
Mean Plasma Glucose: 120 mg/dL
eAG (mmol/L): 6.6 mmol/L

## 2022-07-07 NOTE — Progress Notes (Signed)
<><><><><><><><><><><><><><><><><><><><><><><><><><><><><><><><><> <><><><><><><><><><><><><><><><><><><><><><><><><><><><><><><><><> -   Test results slightly outside the reference range are not unusual. If there is anything important, I will review this with you,  otherwise it is considered normal test values.  If you have further questions,  please do not hesitate to contact me at the office or via My Chart.  <><><><><><><><><><><><><><><><><><><><><><><><><><><><><><><><><> <><><><><><><><><><><><><><><><><><><><><><><><><><><><><><><><><>  -  Chol = 130  & LDL = 60  - Both  Excellent   - Very low risk for Heart Attack  / Stroke <><><><><><><><><><><><><><><><><><><><><><><><><><><><><><><><><>  -  A1c = 5.8%   -  still Blood sugar and A1c  elevated in the borderline and                                                             early or pre-diabetes range which has the same   300% increased risk for heart attack, stroke, cancer and                                                alzheimer- type vascular dementia as full blown diabetes.   But the good news is that diet, exercise with                                                         weight loss can cure the early diabetes at this point. <><><><><><><><><><><><><><><><><><><><><><><><><><><><><><><><><> <><><><><><><><><><><><><><><><><><><><><><><><><><><><><><><><><>  -  Vitamin D = 90 - Excellent - Please keep dosage same <><><><><><><><><><><><><><><><><><><><><><><><><><><><><><><><><>  -  All Else - CBC - Kidneys - Electrolytes - Liver - Magnesium & Thyroid    - all  Normal / OK <><><><><><><><><><><><><><><><><><><><><><><><><><><><><><><><><> <><><><><><><><><><><><><><><><><><><><><><><><><><><><><><><><><>

## 2022-07-09 DIAGNOSIS — M170 Bilateral primary osteoarthritis of knee: Secondary | ICD-10-CM | POA: Diagnosis not present

## 2022-07-10 ENCOUNTER — Encounter: Payer: Self-pay | Admitting: Internal Medicine

## 2022-07-19 ENCOUNTER — Other Ambulatory Visit: Payer: Self-pay

## 2022-07-19 MED ORDER — PRAVASTATIN SODIUM 20 MG PO TABS: 20.0000 mg | ORAL_TABLET | Freq: Every day | ORAL | 0 refills | Status: DC

## 2022-07-19 NOTE — Telephone Encounter (Signed)
Pt's medication was sent to pt's pharmacy as requested. Confirmation received.  °

## 2022-07-20 ENCOUNTER — Other Ambulatory Visit: Payer: Self-pay | Admitting: Internal Medicine

## 2022-07-20 ENCOUNTER — Other Ambulatory Visit: Payer: Self-pay | Admitting: Nurse Practitioner

## 2022-07-20 DIAGNOSIS — Z1231 Encounter for screening mammogram for malignant neoplasm of breast: Secondary | ICD-10-CM

## 2022-07-21 DIAGNOSIS — M6281 Muscle weakness (generalized): Secondary | ICD-10-CM | POA: Diagnosis not present

## 2022-07-21 DIAGNOSIS — M170 Bilateral primary osteoarthritis of knee: Secondary | ICD-10-CM | POA: Diagnosis not present

## 2022-07-21 DIAGNOSIS — M25669 Stiffness of unspecified knee, not elsewhere classified: Secondary | ICD-10-CM | POA: Diagnosis not present

## 2022-07-28 DIAGNOSIS — M1711 Unilateral primary osteoarthritis, right knee: Secondary | ICD-10-CM | POA: Diagnosis not present

## 2022-07-30 DIAGNOSIS — M1712 Unilateral primary osteoarthritis, left knee: Secondary | ICD-10-CM | POA: Diagnosis not present

## 2022-08-04 DIAGNOSIS — M1711 Unilateral primary osteoarthritis, right knee: Secondary | ICD-10-CM | POA: Diagnosis not present

## 2022-08-06 DIAGNOSIS — M1712 Unilateral primary osteoarthritis, left knee: Secondary | ICD-10-CM | POA: Diagnosis not present

## 2022-08-26 ENCOUNTER — Ambulatory Visit
Admission: RE | Admit: 2022-08-26 | Discharge: 2022-08-26 | Disposition: A | Discharge status: Home / Self Care | Payer: Medicare Other | Source: Ambulatory Visit | Attending: Internal Medicine | Admitting: Internal Medicine

## 2022-08-26 DIAGNOSIS — Z1231 Encounter for screening mammogram for malignant neoplasm of breast: Secondary | ICD-10-CM | POA: Diagnosis not present

## 2022-09-30 ENCOUNTER — Other Ambulatory Visit: Payer: Self-pay | Admitting: Internal Medicine

## 2022-09-30 MED ORDER — DEXAMETHASONE 4 MG PO TABS: ORAL_TABLET | ORAL | 0 refills | Status: DC

## 2022-10-05 ENCOUNTER — Telehealth: Payer: Self-pay | Admitting: Cardiovascular Disease

## 2022-10-05 NOTE — Telephone Encounter (Signed)
Called patient back about her message. Patient complaining of SOB with activity, weakness, and shakiness the last couple of weeks. Patient stated her weight has fluctuated and she is not sure if this has something to do with how she is feeling. Patient denies any chest pain. Made an appointment to see NP tomorrow to get evaluated. Patient verbalized understanding.

## 2022-10-05 NOTE — Telephone Encounter (Signed)
Pt c/o Shortness Of Breath: STAT if SOB developed within the last 24 hours or pt is noticeably SOB on the phone  1. Are you currently SOB (can you hear that pt is SOB on the phone)?  No   2. How long have you been experiencing SOB?  Past few weeks   3. Are you SOB when sitting or when up moving around?  When up and moving around  4. Are you currently experiencing any other symptoms?  Shakiness and weakness

## 2022-10-06 ENCOUNTER — Ambulatory Visit (INDEPENDENT_AMBULATORY_CARE_PROVIDER_SITE_OTHER): Payer: Medicare Other

## 2022-10-06 ENCOUNTER — Ambulatory Visit: Payer: Medicare Other | Attending: Nurse Practitioner | Admitting: Nurse Practitioner

## 2022-10-06 ENCOUNTER — Encounter: Payer: Self-pay | Admitting: Nurse Practitioner

## 2022-10-06 ENCOUNTER — Other Ambulatory Visit (HOSPITAL_COMMUNITY): Payer: Self-pay | Admitting: Nurse Practitioner

## 2022-10-06 VITALS — BP 134/86 | HR 59 | Ht 68.0 in | Wt 237.0 lb

## 2022-10-06 DIAGNOSIS — I480 Paroxysmal atrial fibrillation: Secondary | ICD-10-CM

## 2022-10-06 DIAGNOSIS — I10 Essential (primary) hypertension: Secondary | ICD-10-CM

## 2022-10-06 DIAGNOSIS — R0602 Shortness of breath: Secondary | ICD-10-CM | POA: Diagnosis not present

## 2022-10-06 DIAGNOSIS — I7123 Aneurysm of the descending thoracic aorta, without rupture: Secondary | ICD-10-CM | POA: Diagnosis not present

## 2022-10-06 DIAGNOSIS — E782 Mixed hyperlipidemia: Secondary | ICD-10-CM | POA: Diagnosis not present

## 2022-10-06 NOTE — Addendum Note (Signed)
Addended by: Elizabeth Palau on: 10/06/2022 02:23 PM   Modules accepted: Orders

## 2022-10-06 NOTE — Progress Notes (Unsigned)
Applied a 14 day Zio XT monitor to be mailed to patients home  Nishan to read

## 2022-10-06 NOTE — Patient Instructions (Addendum)
Medication Instructions:  Your physician recommends that you continue on your current medications as directed. Please refer to the Current Medication list given to you today. *If you need a refill on your cardiac medications before your next appointment, please call your pharmacy*   Lab Work: TODAY-BNP 1 WEEK BEFORE CHEST CTA BMET If you have labs (blood work) drawn today and your tests are completely normal, you will receive your results only by: MyChart Message (if you have MyChart) OR A paper copy in the mail If you have any lab test that is abnormal or we need to change your treatment, we will call you to review the results.   Testing/Procedures: CHEST CTA W/ CONTRAST  Your physician has requested that you have a lexiscan myoview. For further information please visit [email or web address] Please follow instruction sheet, as given.  ZIO XT- Long Term Monitor Instructions  Your physician has requested you wear a ZIO patch monitor for 14 days.  This is a single patch monitor. [employer] supplies one patch monitor per enrollment. Additional stickers are not available. Please do not apply patch if you will be having a Nuclear Stress Test,  Echocardiogram, Cardiac CT, MRI, or Chest Xray during the period you would be wearing the  monitor. The patch cannot be worn during these tests. You cannot remove and re-apply the  ZIO XT patch monitor.  Your ZIO patch monitor will be mailed 3 day USPS to your address on file. It may take 3-5 days  to receive your monitor after you have been enrolled.  Once you have received your monitor, please review the enclosed instructions. Your monitor  has already been registered assigning a specific monitor serial # to you.  Billing and Patient Assistance Program Information  We have supplied [employer] with any of your insurance information on file for billing purposes. [employer] offers a sliding scale Patient Assistance Program for patients that do not have   insurance, or whose insurance does not completely cover the cost of the ZIO monitor.  You must apply for the Patient Assistance Program to qualify for this discounted rate.  To apply, please call [employer] at [phone], select option 4, select option 2, ask to apply for  Patient Assistance Program. Meredeth Ide will ask your household income, and how many people  are in your household. They will quote your out-of-pocket cost based on that information.  [employer] will also be able to set up a 74-month, interest-free payment plan if needed.  Applying the monitor   Shave hair from upper left chest.  Hold abrader disc by orange tab. Rub abrader in 40 strokes over the upper left chest as  indicated in your monitor instructions.  Clean area with 4 enclosed alcohol pads. Let dry.  Apply patch as indicated in monitor instructions. Patch will be placed under collarbone on left  side of chest with arrow pointing upward.  Rub patch adhesive wings for 2 minutes. Remove white label marked "1". Remove the white  label marked "2". Rub patch adhesive wings for 2 additional minutes.  While looking in a mirror, press and release button in center of patch. A small green light will  flash 3-4 times. This will be your only indicator that the monitor has been turned on.  Do not shower for the first 24 hours. You may shower after the first 24 hours.  Press the button if you feel a symptom. You will hear a small click. Record Date, Time and  Symptom in the Patient Logbook.  When  you are ready to remove the patch, follow instructions on the last 2 pages of Patient  Logbook. Stick patch monitor onto the last page of Patient Logbook.  Place Patient Logbook in the blue and white box. Use locking tab on box and tape box closed  securely. The blue and white box has prepaid postage on it. Please place it in the mailbox as  soon as possible. Your physician should have your test results approximately 7 days after the  monitor  has been mailed back to [facility].  Call [facility] Customer Care at [phone] if you have questions regarding  your ZIO XT patch monitor. Call them immediately if you see an orange light blinking on your  monitor.  If your monitor falls off in less than 4 days, contact our Monitor department at [phone].  If your monitor becomes loose or falls off after 4 days call [employer] at [phone] for  suggestions on securing your monitor   Follow-Up: At [facility], you and your health needs are our priority.  As part of our continuing mission to provide you with exceptional heart care, we have created designated Provider Care Teams.  These Care Teams include your primary Cardiologist (physician) and Advanced Practice Providers (APPs -  Physician Assistants and Nurse Practitioners) who all work together to provide you with the care you need, when you need it.  We recommend signing up for the patient portal called "MyChart".  Sign up information is provided on this After Visit Summary.  MyChart is used to connect with patients for Virtual Visits (Telemedicine).  Patients are able to view lab/test results, encounter notes, upcoming appointments, etc.  Non-urgent messages can be sent to your provider as well.   To learn more about what you can do with MyChart, go to [email or web address].    Your next appointment:   2 month(s)  Provider:   Charlton Haws, MD  or Robin Searing, Np   Other Instructions  Check your blood pressure daily for 2 weeks, then contact the office with your readings.  Make sure to check 2 hours after your medications.   AVOID these things for 30 minutes before checking your blood pressure: No Drinking caffeine. No Drinking alcohol. No Eating. No Smoking. No Exercising.  Five minutes before checking your blood pressure: Pee. Sit in a dining chair. Avoid sitting in a soft couch or armchair. Be quiet. Do not talk.

## 2022-10-06 NOTE — Progress Notes (Signed)
Office Visit    Patient Name: Claudia [name] Date of Encounter: 10/06/2022  Primary Care Provider:  Lucky Cowboy, MD Primary Cardiologist:  Charlton Haws, MD Primary Electrophysiologist: None   Past Medical History    Past Medical History:  Diagnosis Date   Achilles tendonitis    R heal, limits activity   Allergy    Anxiety    Elevated hemoglobin A1c    Hyperlipidemia    Hypertension    IBS (irritable bowel syndrome)    Insomnia    Obesity    Palpitations    SVT (supraventricular tachycardia) 10/29/2013   Vitamin D deficiency    Past Surgical History:  Procedure Laterality Date   CESAREAN SECTION     CESAREAN SECTION     CHOLECYSTECTOMY  10 years ago   TUBAL LIGATION Bilateral     Allergies  Allergies  Allergen Reactions   Ciprofloxacin    Levaquin [Levofloxacin In D5w]     No fluroquinolones due to ascending thoracic aortic aneurysm   Penicillins Other (See Comments)    Had as a child=unknown reaction   Zoloft [Sertraline Hcl]      History of Present Illness    Claudia [name]  is a 72 year old female with a PMH of HTN, PAF, DM likely OSA but no sleep study done. She has CHA2VASC score 3 and is maintained on eliquis who is being seen today for complaint of shortness of breath.  Claudia [name] was seen initially by Claudia [name] on 07/2013 for complaint of palpitations.  She underwent a 2D echo that showed normal EF of 60 to 65% an event monitor was completed with a short burst of SVT and patient was started on atenolol.  She was referred to Claudia [name] for EP consultation.  Patient was given consideration for ablation and medication however patient elected to avoid additional medicines or ablation at that time.  She was diagnosed with PAF in 2015 and started on Eliquis and had decreased to beta-blockers due to bradycardia.  She had a [place] Myoview completed in 7/thousand 18 that was low risk.  She was last seen by Claudia [name] on 05/05/2022 and had lost  close to 40 pounds with weight watchers has been seen by Ortho care due to chronic knee pain and heel and Achilles pain.  Patient was advised to have sleep study due to history of snoring but declined.  Blood pressures were noted to be well-controlled during visit.  She underwent a calcium scoring that showed score of 94.4 that was considered above average for age and patient was advised to continue pravastatin.  Claudia [name] presents today alone for complaint of shortness of breath with exertion.  Since last being seen in the office patient reports she has been experiencing episodes of shortness of breath with activity that is relieved with rest.  She also notes jittery sensation and feelings of anxiety related to activities.  Patient's blood pressure today was initially 134/86 and was 128/72 on recheck.  She is compliant with her current medications and denies any adverse reactions.  She is currently on a calorie reduction plan and has begun light aerobic activities.  She reports not eating properly prior to completing her physical activity exercises.  She had initially lost 50 pounds but has gained back 37 pounds over the past year.  She does endorse occasional episodes of palpitations which are relieved with 12.5 mg dose of metoprolol.  Patient denies chest pain, palpitations, dyspnea, PND, orthopnea,  nausea, vomiting, dizziness, syncope, edema, weight gain, or early satiety.  Home Medications    Current Outpatient Medications  Medication Sig Dispense Refill   apixaban (ELIQUIS) 5 MG TABS tablet TAKE 1 TABLET TWICE A DAY 180 tablet 1   Ascorbic Acid (VITAMIN C PO) Take by mouth 2 (two) times daily.     Cholecalciferol (VITAMIN D-3 PO) Take 8,000 Units by mouth daily.     CINNAMON PO Take 1,000 mg by mouth daily.      dexamethasone (DECADRON) 4 MG tablet Take 1 tab 3 x /day for 2 days,      then 2 x /day for 2  Days,     then 1 tab daily 13 tablet 0   diclofenac Sodium (VOLTAREN) 1 % GEL Apply 4 g  topically 4 (four) times daily as needed.     DiphenhydrAMINE HCl (BENADRYL ALLERGY PO) Take by mouth daily.     Glucosamine 750 MG TABS Take 750 mg by mouth in the morning and at bedtime.     hydrochlorothiazide (HYDRODIURIL) 25 MG tablet TAKE 1 TABLET DAILY FOR BLOOD PRESSURE AND FLUID RETENTION, ANKLE SWELLING 90 tablet 3   lidocaine (LIDODERM) 5 % as needed.     loperamide (IMODIUM) 2 MG capsule Take 4 mg by mouth daily.      Menaquinone-7 (VITAMIN K2 PO) Take 100 mcg by mouth daily.     metoprolol succinate (TOPROL-XL) 25 MG 24 hr tablet TAKE ONE-HALF (1/2) TABLET DAILY 45 tablet 3   olmesartan (BENICAR) 40 MG tablet Take  1 tablet  at Night  for BP 90 tablet 3   omega-3 acid ethyl esters (LOVAZA) 1 g capsule Take 1 g by mouth daily.     oxybutynin (DITROPAN XL) 10 MG 24 hr tablet Take  1 tablet  at Suppertime  for OverActive Bladder 90 tablet 3   pantoprazole (PROTONIX) 40 MG tablet Take  1 tablet  Daily  to Prevent Heartburn &  Indigestion 90 tablet 3   pravastatin (PRAVACHOL) 20 MG tablet Take 1 tablet (20 mg total) by mouth daily. 30 tablet 0   vitamin B-12 (CYANOCOBALAMIN) 1000 MCG tablet Take 1,000 mcg by mouth daily.     zinc gluconate 50 MG tablet Take 50 mg by mouth daily. Per patient taking 25 mg daily     No current facility-administered medications for this visit.     Review of Systems  Please see the history of present illness.    (+) Jitteriness, shortness of breath (+) Deconditioning  All other systems reviewed and are otherwise negative except as noted above.  Physical Exam    Wt Readings from Last 3 Encounters:  10/06/22 237 lb (107.5 kg)  07/06/22 244 lb (110.7 kg)  05/05/22 242 lb (109.8 kg)   VS: Vitals:   10/06/22 0836  BP: 134/86  Pulse: (!) 59  SpO2: 95%  ,Body mass index is 36.04 kg/m.  Constitutional:      Appearance: Healthy appearance. Not in distress.  Neck:     Vascular: JVD normal.  Pulmonary:     Effort: Pulmonary effort is normal.      Breath sounds: No wheezing. No rales. Diminished in the bases Cardiovascular:     Normal rate. Regular rhythm. Normal S1. Normal S2.      Murmurs: There is no murmur.  Edema:    Peripheral edema absent.  Abdominal:     Palpations: Abdomen is soft non tender. There is no hepatomegaly.  Skin:  General: Skin is warm and dry.  Neurological:     General: No focal deficit present.     Mental Status: Alert and oriented to person, place and time.     Cranial Nerves: Cranial nerves are intact.  EKG/LABS/ Recent Cardiac Studies    ECG personally reviewed by me today -sinus bradycardia with a rate of 59 bpm and no acute changes consistent with previous EKG  Cardiac Studies & Procedures     STRESS TESTS  MYOCARDIAL PERFUSION IMAGING 10/19/2016  Narrative  The left ventricular ejection fraction is hyperdynamic (>65%).  Nuclear stress EF: 68%. Overall normal wall motion  There was no ST segment deviation noted during stress.  Defect 1: There is a small defect present in the apex location. This may be representative a small apical infarct or perhaps represents artifact.  This is a low risk study.  Donato Schultz, MD   ECHOCARDIOGRAM  ECHOCARDIOGRAM COMPLETE 01/29/2022  Narrative ECHOCARDIOGRAM REPORT    Patient Name:   ARABELLA MANOCCHIO [facility] Date of Exam: 01/29/2022 Medical Rec #:  [id number]           Height:       68.0 in Accession #:    [id number]          Weight:       240.0 lb Date of Birth:  08/10/1950            BSA:          2.208 m Patient Age:    71 years            BP:           140/80 mmHg Patient Gender: F                   HR:           65 bpm. Exam Location:  [address]  Procedure: 2D Echo, Cardiac Doppler, Color Doppler, 3D Echo and Strain Analysis  Indications:    R06.9 DOE  History:        Patient has prior history of Echocardiogram examinations, most recent 08/08/2013. Signs/Symptoms:Fatigue.  Sonographer:    Thurman Coyer RDCS Referring Phys: [id number]  TESSA N CONTE  IMPRESSIONS   1. Left ventricular ejection fraction, by estimation, is 60 to 65%. The left ventricle has normal function. The left ventricle has no regional wall motion abnormalities. Left ventricular diastolic parameters are consistent with Grade II diastolic dysfunction (pseudonormalization). Elevated left ventricular end-diastolic pressure. The average left ventricular global longitudinal strain is -21.4 %. The global longitudinal strain is normal. 2. Right ventricular systolic function is normal. The right ventricular size is normal. There is mildly elevated pulmonary artery systolic pressure. 3. Left atrial size was moderately dilated. 4. The mitral valve is abnormal. Trivial mitral valve regurgitation. No evidence of mitral stenosis. 5. The aortic valve is tricuspid. Aortic valve regurgitation is not visualized. No aortic stenosis is present. 6. Aortic dilatation noted. There is moderate dilatation of the ascending aorta, measuring 42 mm. 7. The inferior vena cava is normal in size with greater than 50% respiratory variability, suggesting right atrial pressure of 3 mmHg.  FINDINGS Left Ventricle: Left ventricular ejection fraction, by estimation, is 60 to 65%. The left ventricle has normal function. The left ventricle has no regional wall motion abnormalities. The average left ventricular global longitudinal strain is -21.4 %. The global longitudinal strain is normal. The left ventricular internal cavity size was normal in size. There is no left ventricular hypertrophy.  Left ventricular diastolic parameters are consistent with Grade II diastolic dysfunction (pseudonormalization). Elevated left ventricular end-diastolic pressure.  Right Ventricle: The right ventricular size is normal. No increase in right ventricular wall thickness. Right ventricular systolic function is normal. There is mildly elevated pulmonary artery systolic pressure. The tricuspid regurgitant velocity is  2.90 m/s, and with an assumed right atrial pressure of 8 mmHg, the estimated right ventricular systolic pressure is 41.6 mmHg.  Left Atrium: Left atrial size was moderately dilated.  Right Atrium: Right atrial size was normal in size.  Pericardium: There is no evidence of pericardial effusion.  Mitral Valve: The mitral valve is abnormal. There is mild thickening of the mitral valve leaflet(s). There is mild calcification of the mitral valve leaflet(s). Mild mitral annular calcification. Trivial mitral valve regurgitation. No evidence of mitral valve stenosis.  Tricuspid Valve: The tricuspid valve is normal in structure. Tricuspid valve regurgitation is mild . No evidence of tricuspid stenosis.  Aortic Valve: The aortic valve is tricuspid. Aortic valve regurgitation is not visualized. No aortic stenosis is present.  Pulmonic Valve: The pulmonic valve was normal in structure. Pulmonic valve regurgitation is not visualized. No evidence of pulmonic stenosis.  Aorta: Aortic dilatation noted. There is moderate dilatation of the ascending aorta, measuring 42 mm.  Venous: The inferior vena cava is normal in size with greater than 50% respiratory variability, suggesting right atrial pressure of 3 mmHg.  IAS/Shunts: No atrial level shunt detected by color flow Doppler.   LEFT VENTRICLE PLAX 2D LVIDd:         4.50 cm   Diastology LVIDs:         2.60 cm   LV e' medial:    5.82 cm/s LV PW:         1.10 cm   LV E/e' medial:  19.6 LV IVS:        1.30 cm   LV e' lateral:   6.48 cm/s LVOT diam:     2.30 cm   LV E/e' lateral: 17.6 LV SV:         102 LV SV Index:   46        2D Longitudinal Strain LVOT Area:     4.15 cm  2D Strain GLS Avg:     -21.4 %  3D Volume EF: 3D EF:        64 % LV EDV:       156 ml LV ESV:       56 ml LV SV:        100 ml  RIGHT VENTRICLE RV Basal diam:  3.60 cm RV Mid diam:    4.00 cm RV S prime:     11.40 cm/s TAPSE (M-mode): 3.0 cm  LEFT ATRIUM               Index        RIGHT ATRIUM           Index LA diam:        4.90 cm  2.22 cm/m   RA Area:     21.70 cm LA Vol (A2C):   86.6 ml  39.21 ml/m  RA Volume:   61.40 ml  27.80 ml/m LA Vol (A4C):   104.2 ml 47.16 ml/m LA Biplane Vol: 86.0 ml  38.94 ml/m AORTIC VALVE LVOT Vmax:   97.90 cm/s LVOT Vmean:  63.900 cm/s LVOT VTI:    0.246 m  AORTA Ao Root diam: 3.40 cm Ao Asc diam:  4.20  cm  MITRAL VALVE                TRICUSPID VALVE MV Area (PHT): 4.68 cm     TR Peak grad:   33.6 mmHg MV Decel Time: 162 msec     TR Vmax:        290.00 cm/s MV E velocity: 114.00 cm/s MV A velocity: 62.60 cm/s   SHUNTS MV E/A ratio:  1.82         Systemic VTI:  0.25 m Systemic Diam: 2.30 cm  Charlton Haws MD Electronically signed by Charlton Haws MD Signature Date/Time: 01/29/2022/12:17:41 PM    Final    MONITORS  CARDIAC EVENT MONITOR 11/30/2016  Narrative Sinus Rhythm PAC;s Symptoms fluttering do not always correlate with arrhythmia   CT SCANS  CT CARDIAC SCORING (SELF PAY ONLY) 06/04/2022  Addendum 06/04/2022  9:06 PM ADDENDUM REPORT: 06/04/2022 21:03  EXAM: OVER-READ INTERPRETATION  CT CHEST  The following report is an over-read performed by radiologist Dr. Carola Frost [facility] on 06/04/2022. This over-read does not include interpretation of cardiac or coronary anatomy or pathology. The coronary calcium score interpretation by the cardiologist is attached.  COMPARISON:  CT angiogram chest 04/24/2019  FINDINGS: No enlarged lymph nodes are identified in the mediastinum. There some atelectasis or scarring in the lingula. Lungs are otherwise clear. No significant osseous abnormality.  IMPRESSION: Atelectasis or scarring in the lingula.   Electronically Signed By: Darliss Cheney M.D. On: 06/04/2022 21:03  Narrative : Cardiovascular Disease Risk stratification  EXAM: Coronary Calcium Score  TECHNIQUE: A gated, non-contrast computed tomography scan of the  heart was performed using 3mm slice thickness. Axial images were analyzed on a dedicated workstation. Calcium scoring of the coronary arteries was performed using the Agatston method.  FINDINGS: Coronary arteries: Normal origins.  Coronary Calcium Score:  Left main: 0  Left anterior descending artery: 42.5  Left circumflex artery: 27.9  Right coronary artery: 24  Total: 94.4  Percentile: 74  Pericardium: Normal.  Ascending Aorta: Normal caliber, with mild aortic root calcification.  Mild Mitral annular calcification.  Non-cardiac: See separate report from [facility].  IMPRESSION: Coronary calcium score of 94.4. This was 63 percentile for age-, race-, and sex-matched controls.  RECOMMENDATIONS: Coronary artery calcium (CAC) score is a strong predictor of incident coronary heart disease (CHD) and provides predictive information beyond traditional risk factors. CAC scoring is reasonable to use in the decision to withhold, postpone, or initiate statin therapy in intermediate-risk or selected borderline-risk asymptomatic adults (age 69-75 years and LDL-C >=70 to <190 mg/dL) who do not have diabetes or established atherosclerotic cardiovascular disease (ASCVD).* In intermediate-risk (10-year ASCVD risk >=7.5% to <20%) adults or selected borderline-risk (10-year ASCVD risk >=5% to <7.5%) adults in whom a CAC score is measured for the purpose of making a treatment decision the following recommendations have been made:  If CAC=0, it is reasonable to withhold statin therapy and reassess in 5 to 10 years, as long as higher risk conditions are absent (diabetes mellitus, family history of premature CHD in first degree relatives (males <55 years; females <65 years), cigarette smoking, or LDL >=190 mg/dL).  If CAC is 1 to 99, it is reasonable to initiate statin therapy for patients >=53 years of age.  If CAC is >=100 or >=75th percentile, it is reasonable to  initiate statin therapy at any age.  Cardiology referral should be considered for patients with CAC scores >=400 or >=75th percentile.  *2018 AHA/ACC/AACVPR/AAPA/ABC/ACPM/ADA/AGS/APhA/ASPC/NLA/PCNA Guideline on  the Management of Blood Cholesterol: A Report of the [employer] of Cardiology/American Heart Association Task Force on Clinical Practice Guidelines. J Am Coll Cardiol. 2019;73(24):3168-3209.  Thomasene Ripple, DO  The noncardiac portion of this study will be interpreted in separate report by the radiologist.  Electronically Signed: By: Thomasene Ripple D.O. On: 06/02/2022 15:34          Risk Assessment/Calculations:    CHA2DS2-VASc Score = 3   This indicates a 3.2% annual risk of stroke. The patient's score is based upon: CHF History: 0 HTN History: 1 Diabetes History: 0 Stroke History: 0 Vascular Disease History: 0 Age Score: 1 Gender Score: 1           Lab Results  Component Value Date   WBC 5.6 07/06/2022   HGB 12.4 07/06/2022   HCT 37.6 07/06/2022   MCV 88.1 07/06/2022   PLT 267 07/06/2022   Lab Results  Component Value Date   CREATININE 0.67 07/06/2022   BUN 14 07/06/2022   NA 138 07/06/2022   K 4.5 07/06/2022   CL 102 07/06/2022   CO2 28 07/06/2022   Lab Results  Component Value Date   ALT 16 07/06/2022   AST 18 07/06/2022   ALKPHOS 67 10/11/2016   BILITOT 0.6 07/06/2022   Lab Results  Component Value Date   CHOL 130 07/06/2022   HDL 48 (L) 07/06/2022   LDLCALC 60 07/06/2022   TRIG 139 07/06/2022   CHOLHDL 2.7 07/06/2022    Lab Results  Component Value Date   HGBA1C 5.8 (H) 07/06/2022     Assessment & Plan    Shortness of breath: -Today reports shortness of breath with activity.  Is relieved with rest. -We will have patient complete Lexiscan Myoview to rule out possible ischemic cause for shortness of breath. -Patient was euvolemic on exam today and was advised to continue low-sodium heart healthy diet.  2.  Atrial  fibrillation: -Patient currently rate controlled with Toprol-XL 25 mg with as needed 12.5 mg daily -Has complained of shortness of breath which may be possibly related to arrhythmia and will have patient complete 14-day ZIO to rule out atrial arrhythmia. -Patient's hemoglobin was 12.4 and creatinine was 0.6 -Continue Eliquis 5 mg twice daily -CHA2DS2-VASc Score = 3 [CHF History: 0, HTN History: 1, Diabetes History: 0, Stroke History: 0, Vascular Disease History: 0, Age Score: 1, Gender Score: 1].  Therefore, the patient's annual risk of stroke is 3.2 %.      3.  Thoracic aortic aneurysm: -2D echo completed 01/2022 with mild progression of dilation at 42 mm -Patient will undergo CT of the chest to evaluate possible progressive dilation of aneurysm. -BMET prior to test  4.  Essential hypertension: -Patient's blood pressure today was elevated initially at 134/86 and was 128/72 on recheck. -Continue HCTZ 25 mg daily Toprol-XL 25 mg daily  5.  Hyperlipidemia: -Patient's LDL cholesterol was 60 -Continue pravastatin 40 mg daily  Disposition: Follow-up with Charlton Haws, MD or APP in 2 months Informed Consent   Shared Decision Making/Informed Consent The risks [chest pain, shortness of breath, cardiac arrhythmias, dizziness, blood pressure fluctuations, myocardial infarction, stroke/transient ischemic attack, nausea, vomiting, allergic reaction, radiation exposure, metallic taste sensation and life-threatening complications (estimated to be 1 in 10,000)], benefits (risk stratification, diagnosing coronary artery disease, treatment guidance) and alternatives of a nuclear stress test were discussed in detail with Ms. Josey and she agrees to proceed.      Medication Adjustments/Labs and Tests Ordered: Current medicines  are reviewed at length with the patient today.  Concerns regarding medicines are outlined above.   Signed, Napoleon Form, Leodis Rains, NP 10/06/2022, 12:18 PM [facility]

## 2022-10-07 LAB — BASIC METABOLIC PANEL
BUN/Creatinine Ratio: 14 (ref 12–28)
BUN: 10 mg/dL (ref 8–27)
CO2: 23 mmol/L (ref 20–29)
Calcium: 9.8 mg/dL (ref 8.7–10.3)
Chloride: 99 mmol/L (ref 96–106)
Creatinine, Ser: 0.72 mg/dL (ref 0.57–1.00)
Glucose: 107 mg/dL — ABNORMAL HIGH (ref 70–99)
Potassium: 4.4 mmol/L (ref 3.5–5.2)
Sodium: 136 mmol/L (ref 134–144)
eGFR: 90 mL/min/{1.73_m2} (ref >59)

## 2022-10-07 LAB — PRO B NATRIURETIC PEPTIDE: NT-Pro BNP: 261 pg/mL (ref 0–301)

## 2022-10-18 ENCOUNTER — Other Ambulatory Visit (HOSPITAL_BASED_OUTPATIENT_CLINIC_OR_DEPARTMENT_OTHER): Payer: Medicare Other

## 2022-10-19 ENCOUNTER — Ambulatory Visit: Payer: Medicare Other | Admitting: Internal Medicine

## 2022-10-20 ENCOUNTER — Ambulatory Visit: Payer: Medicare Other | Attending: Nurse Practitioner

## 2022-10-20 DIAGNOSIS — R0602 Shortness of breath: Secondary | ICD-10-CM | POA: Diagnosis not present

## 2022-10-20 DIAGNOSIS — I10 Essential (primary) hypertension: Secondary | ICD-10-CM | POA: Diagnosis not present

## 2022-10-20 DIAGNOSIS — I480 Paroxysmal atrial fibrillation: Secondary | ICD-10-CM | POA: Diagnosis not present

## 2022-10-21 ENCOUNTER — Telehealth (HOSPITAL_COMMUNITY): Payer: Self-pay

## 2022-10-21 LAB — BASIC METABOLIC PANEL
BUN/Creatinine Ratio: 18 (ref 12–28)
BUN: 14 mg/dL (ref 8–27)
CO2: 24 mmol/L (ref 20–29)
Calcium: 9.7 mg/dL (ref 8.7–10.3)
Chloride: 100 mmol/L (ref 96–106)
Creatinine, Ser: 0.8 mg/dL (ref 0.57–1.00)
Glucose: 114 mg/dL — ABNORMAL HIGH (ref 70–99)
Potassium: 4.4 mmol/L (ref 3.5–5.2)
Sodium: 137 mmol/L (ref 134–144)
eGFR: 79 mL/min/{1.73_m2} (ref >59)

## 2022-10-21 NOTE — Telephone Encounter (Signed)
Detailed instructions left on the patient's answering machine. Asked to call back with any questions. S.Williams EMTP/CCT 

## 2022-10-26 ENCOUNTER — Ambulatory Visit (HOSPITAL_BASED_OUTPATIENT_CLINIC_OR_DEPARTMENT_OTHER): Payer: Medicare Other

## 2022-10-26 DIAGNOSIS — I480 Paroxysmal atrial fibrillation: Secondary | ICD-10-CM | POA: Diagnosis not present

## 2022-10-26 DIAGNOSIS — R0602 Shortness of breath: Secondary | ICD-10-CM

## 2022-10-26 DIAGNOSIS — I10 Essential (primary) hypertension: Secondary | ICD-10-CM

## 2022-10-26 LAB — MYOCARDIAL PERFUSION IMAGING
LV dias vol: 100 mL (ref 46–106)
LV sys vol: 34 mL
Nuc Stress EF: 66 %
Peak HR: 81 {beats}/min
Rest HR: 56 {beats}/min
Rest Nuclear Isotope Dose: 10.5 mCi
SDS: 2
SRS: 4
SSS: 6
ST Depression (mm): 0 mm
Stress Nuclear Isotope Dose: 32.7 mCi
TID: 0.96

## 2022-10-26 MED ORDER — TECHNETIUM TC 99M TETROFOSMIN IV KIT
32.7000 | PACK | Freq: Once | INTRAVENOUS | Status: AC | PRN
  Administered 2022-10-26: 32.7 via INTRAVENOUS

## 2022-10-26 MED ORDER — REGADENOSON 0.4 MG/5ML IV SOLN
0.4000 mg | Freq: Once | INTRAVENOUS | Status: AC
  Administered 2022-10-26: 0.4 mg via INTRAVENOUS

## 2022-10-26 MED ORDER — TECHNETIUM TC 99M TETROFOSMIN IV KIT
10.5000 | PACK | Freq: Once | INTRAVENOUS | Status: AC | PRN
  Administered 2022-10-26: 10.5 via INTRAVENOUS

## 2022-10-27 ENCOUNTER — Ambulatory Visit (HOSPITAL_BASED_OUTPATIENT_CLINIC_OR_DEPARTMENT_OTHER)
Admission: RE | Admit: 2022-10-27 | Discharge: 2022-10-27 | Disposition: A | Discharge status: Home / Self Care | Payer: Medicare Other | Source: Ambulatory Visit | Attending: Nurse Practitioner | Admitting: Nurse Practitioner

## 2022-10-27 ENCOUNTER — Encounter (HOSPITAL_BASED_OUTPATIENT_CLINIC_OR_DEPARTMENT_OTHER): Payer: Self-pay

## 2022-10-27 DIAGNOSIS — R0602 Shortness of breath: Secondary | ICD-10-CM | POA: Diagnosis not present

## 2022-10-27 DIAGNOSIS — I480 Paroxysmal atrial fibrillation: Secondary | ICD-10-CM | POA: Insufficient documentation

## 2022-10-27 DIAGNOSIS — I700 Atherosclerosis of aorta: Secondary | ICD-10-CM | POA: Diagnosis not present

## 2022-10-27 DIAGNOSIS — I10 Essential (primary) hypertension: Secondary | ICD-10-CM | POA: Diagnosis not present

## 2022-10-27 DIAGNOSIS — I7121 Aneurysm of the ascending aorta, without rupture: Secondary | ICD-10-CM | POA: Diagnosis not present

## 2022-10-27 DIAGNOSIS — I517 Cardiomegaly: Secondary | ICD-10-CM | POA: Diagnosis not present

## 2022-10-27 MED ORDER — IOHEXOL 350 MG/ML SOLN
100.0000 mL | Freq: Once | INTRAVENOUS | Status: AC | PRN
  Administered 2022-10-27: 75 mL via INTRAVENOUS

## 2022-11-01 ENCOUNTER — Other Ambulatory Visit: Payer: Self-pay

## 2022-11-01 DIAGNOSIS — R0602 Shortness of breath: Secondary | ICD-10-CM

## 2022-11-01 DIAGNOSIS — I480 Paroxysmal atrial fibrillation: Secondary | ICD-10-CM

## 2022-11-01 DIAGNOSIS — I7123 Aneurysm of the descending thoracic aorta, without rupture: Secondary | ICD-10-CM

## 2022-11-01 DIAGNOSIS — M79671 Pain in right foot: Secondary | ICD-10-CM | POA: Diagnosis not present

## 2022-11-04 DIAGNOSIS — H47323 Drusen of optic disc, bilateral: Secondary | ICD-10-CM | POA: Diagnosis not present

## 2022-11-08 ENCOUNTER — Encounter: Payer: Self-pay | Admitting: Cardiovascular Disease

## 2022-11-11 ENCOUNTER — Ambulatory Visit: Payer: TRICARE For Life (TFL) | Admitting: Nurse Practitioner

## 2022-11-16 ENCOUNTER — Other Ambulatory Visit (HOSPITAL_COMMUNITY): Payer: Self-pay | Admitting: *Deleted

## 2022-11-16 DIAGNOSIS — R0602 Shortness of breath: Secondary | ICD-10-CM

## 2022-11-17 ENCOUNTER — Encounter: Payer: Self-pay | Admitting: Nurse Practitioner

## 2022-11-17 ENCOUNTER — Ambulatory Visit (INDEPENDENT_AMBULATORY_CARE_PROVIDER_SITE_OTHER): Payer: Medicare Other | Admitting: Nurse Practitioner

## 2022-11-17 VITALS — BP 130/70 | HR 55 | Temp 97.6°F | Ht 68.0 in | Wt 238.4 lb

## 2022-11-17 DIAGNOSIS — R6889 Other general symptoms and signs: Secondary | ICD-10-CM

## 2022-11-17 DIAGNOSIS — I10 Essential (primary) hypertension: Secondary | ICD-10-CM | POA: Diagnosis not present

## 2022-11-17 DIAGNOSIS — I700 Atherosclerosis of aorta: Secondary | ICD-10-CM | POA: Diagnosis not present

## 2022-11-17 DIAGNOSIS — G4700 Insomnia, unspecified: Secondary | ICD-10-CM | POA: Diagnosis not present

## 2022-11-17 DIAGNOSIS — Z0000 Encounter for general adult medical examination without abnormal findings: Secondary | ICD-10-CM

## 2022-11-17 DIAGNOSIS — K589 Irritable bowel syndrome without diarrhea: Secondary | ICD-10-CM | POA: Diagnosis not present

## 2022-11-17 DIAGNOSIS — Z0001 Encounter for general adult medical examination with abnormal findings: Secondary | ICD-10-CM | POA: Diagnosis not present

## 2022-11-17 DIAGNOSIS — I712 Thoracic aortic aneurysm, without rupture, unspecified: Secondary | ICD-10-CM

## 2022-11-17 DIAGNOSIS — Z79899 Other long term (current) drug therapy: Secondary | ICD-10-CM

## 2022-11-17 DIAGNOSIS — I480 Paroxysmal atrial fibrillation: Secondary | ICD-10-CM

## 2022-11-17 DIAGNOSIS — E782 Mixed hyperlipidemia: Secondary | ICD-10-CM | POA: Diagnosis not present

## 2022-11-17 DIAGNOSIS — E559 Vitamin D deficiency, unspecified: Secondary | ICD-10-CM | POA: Diagnosis not present

## 2022-11-17 DIAGNOSIS — R7309 Other abnormal glucose: Secondary | ICD-10-CM | POA: Diagnosis not present

## 2022-11-17 DIAGNOSIS — M816 Localized osteoporosis [Lequesne]: Secondary | ICD-10-CM | POA: Diagnosis not present

## 2022-11-17 NOTE — Progress Notes (Signed)
MEDICARE ANNUAL WELLNESS VISIT AND FOLLOW UP  Assessment:    Encounter for Medicare annual wellness exam Due annually  Health maintenance reviewed Healthily lifestyle goals set  Paroxysmal atrial fibrillation (HCC) Follows with cardio Continue Eliquis Continue to monitor  Thoracic aortic aneurysm without rupture (HCC) Follows with cardio, stable 04/2019 CTA Continue to monitor  Atherosclerosis of aorta Continue medications;  Discussed lifestyle modifications. Recommended diet heavy in fruits and veggies, omega 3's. Decrease consumption of animal meats, cheeses, and dairy products. Remain active and exercise as tolerated. Continue to monitor. Check lipids.  Obesity  Discussed appropriate BMI Diet modification. Physical activity. Encouraged/praised to build confidence.  Mixed hyperlipidemia Continue medications;  Discussed lifestyle modifications. Recommended diet heavy in fruits and veggies, omega 3's. Decrease consumption of animal meats, cheeses, and dairy products. Remain active and exercise as tolerated. Continue to monitor.  Medication management All medications discussed and reviewed in full. All questions and concerns regarding medications addressed.    Vitamin D deficiency Continue supplement Continue to monitor  Abnormal glucose A1C (<7) Blood pressure (<130/80) Cholesterol (LDL <70) Continue Eye Exam yearly  Continue Dental Exam Q6 mo Discussed dietary recommendations Discussed Physical Activity recommendations  Insomnia, unspecified type Discussed good sleep hygiene. Establish bed and wake times. Sleep restriction-only sleep estimated hrs sleep. Bed only for sex and sleep, only sleep when sleepy, out of bed if anxious (stimulus control). Reviewed relaxation techniques, mindful meditations. Expected sleep duration. Addressed worries about not sleeping.   Irritable bowel syndrome, unspecified type Controlled  Imodium and Hycosamine  PRN Stay well hydrated. She is up to date on colonoscopy Continue to monitor  Essential hypertension Controlled Continue medications;  Discussed DASH (Dietary Approaches to Stop Hypertension) DASH diet is lower in sodium than a typical American diet. Cut back on foods that are high in saturated fat, cholesterol, and trans fats. Eat more whole-grain foods, fish, poultry, and nuts Remain active and exercise as tolerated daily.  Monitor BP at home-Call if greater than 130/80.   Localized osteoporosis without current pathological fracture Pursue a combination of weight-bearing exercises and strength training. Advised on fall prevention measures including proper lighting in all rooms, removal of area rugs and floor clutter, use of walking devices as deemed appropriate, avoidance of uneven walking surfaces. Smoking cessation and moderate alcohol consumption if applicable Consume 800 to 1000 IU of vitamin D daily with a goal vitamin D serum value of 30 ng/mL or higher. Aim for 1000 to 1200 mg of elemental calcium daily through supplements and/or dietary sources.   Orders Placed This Encounter  Procedures   CBC with Differential/Platelet   COMPLETE METABOLIC PANEL WITH GFR   Lipid panel   Hemoglobin A1c    Notify office for further evaluation and treatment, questions or concerns if any reported s/s fail to improve.   The patient was advised to call back or seek an in-person evaluation if any symptoms worsen or if the condition fails to improve as anticipated.   Further disposition pending results of labs. Discussed med's effects and SE's.    I discussed the assessment and treatment plan with the patient. The patient was provided an opportunity to ask questions and all were answered. The patient agreed with the plan and demonstrated an understanding of the instructions.  Discussed med's effects and SE's. Screening labs and tests as requested with regular follow-up as recommended.  I  provided 40 minutes of face-to-face time during this encounter including counseling, chart review, and critical decision making was preformed.  Today's  Plan of Care is based on a patient-centered health care approach known as shared decision making - the decisions, tests and treatments allow for patient preferences and values to be balanced with clinical evidence.    Future Appointments  Date Time Provider Department Center  12/09/2022  1:30 PM Gaston Islam., NP [facility]  12/23/2022 10:10 AM MC ECHO/[facility] OP [facility]  12/23/2022 11:20 AM Dorthula Nettles, DO [facility] None  02/14/2023  2:30 PM Lucky Cowboy, MD [facility] None  05/19/2023  2:30 PM Adela Glimpse, NP [facility] None  08/18/2023  2:00 PM Lucky Cowboy, MD [facility] None  11/18/2023 10:30 AM Adela Glimpse, NP [facility] None     Plan:   During the course of the visit the patient was educated and counseled about appropriate screening and preventive services including:   Pneumococcal vaccine  Prevnar 13 Influenza vaccine Td vaccine Screening electrocardiogram Bone densitometry screening Colorectal cancer screening Diabetes screening Glaucoma screening Nutrition counseling  Advanced directives: requested   Subjective:  Claudia [name] is a 72 y.o. female who presents for Medicare Annual Wellness Visit and 3 month follow up.   Patient reports that overall she is doing well.  She has no additional concerns in clinic today.   Her blood pressure has been controlled at home, today their BP is BP: 130/70  She does not workout, 3 days a week, does seated. She denies chest pain, shortness of breath, dizziness.   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 Eden Emms.  On elequis and atenolol for a. Fib.  She has osteoporosis, started on fosamax x 01/2018. Stopped due to progress in DEXA scan  screening.  Will need f/u DEXA next year.  BMI is Body mass index is 36.25 kg/m., she is working on diet and exercise.  Wt Readings from Last 3 Encounters:  11/17/22 238 lb 6.4 oz (108.1 kg)  10/26/22 237 lb (107.5 kg)  10/06/22 237 lb (107.5 kg)   She is not on cholesterol medication. Her cholesterol is not at goal. However she would like to start cholesterol medication due to aortic plaque. The cholesterol last visit was:   Lab Results  Component Value Date   CHOL 130 07/06/2022   HDL 48 (L) 07/06/2022   LDLCALC 60 07/06/2022   TRIG 139 07/06/2022   CHOLHDL 2.7 07/06/2022   She has been working on diet and exercise for glucose management, and denies paresthesia of the feet, polydipsia and polyuria. Last A1C in the office was:  Lab Results  Component Value Date   HGBA1C 5.8 (H) 07/06/2022   Last GFR: Lab Results  Component Value Date   GFRNONAA >60 07/31/2021   Patient is on Vitamin D supplement.   Lab Results  Component Value Date   VD25OH 90 07/06/2022       Medication Review:  Current Outpatient Medications (Endocrine & Metabolic):    dexamethasone (DECADRON) 4 MG tablet, Take 1 tab 3 x /day for 2 days,      then 2 x /day for 2  Days,     then 1 tab daily  Current Outpatient Medications (Cardiovascular):    hydrochlorothiazide (HYDRODIURIL) 25 MG tablet, TAKE 1 TABLET DAILY FOR BLOOD PRESSURE AND FLUID RETENTION, ANKLE SWELLING   metoprolol succinate (TOPROL-XL) 25 MG 24 hr tablet, TAKE ONE-HALF (1/2) TABLET DAILY   olmesartan (BENICAR) 40 MG tablet, Take  1 tablet  at Night  for BP  omega-3 acid ethyl esters (LOVAZA) 1 g capsule, Take 1 g by mouth daily.   pravastatin (PRAVACHOL) 20 MG tablet, Take 1 tablet (20 mg total) by mouth daily.  Current Outpatient Medications (Respiratory):    DiphenhydrAMINE HCl (BENADRYL ALLERGY PO), Take by mouth daily.   Current Outpatient Medications (Hematological):    apixaban (ELIQUIS) 5 MG TABS tablet, TAKE 1 TABLET TWICE A  DAY   vitamin B-12 (CYANOCOBALAMIN) 1000 MCG tablet, Take 1,000 mcg by mouth daily.  Current Outpatient Medications (Other):    Ascorbic Acid (VITAMIN C PO), Take by mouth 2 (two) times daily.   Cholecalciferol (VITAMIN D-3 PO), Take 8,000 Units by mouth daily.   CINNAMON PO, Take 1,000 mg by mouth daily.    diclofenac Sodium (VOLTAREN) 1 % GEL, Apply 4 g topically 4 (four) times daily as needed.   Glucosamine 750 MG TABS, Take 750 mg by mouth in the morning and at bedtime.   lidocaine (LIDODERM) 5 %, as needed.   loperamide (IMODIUM) 2 MG capsule, Take 4 mg by mouth daily.    Menaquinone-7 (VITAMIN K2 PO), Take 100 mcg by mouth daily.   oxybutynin (DITROPAN XL) 10 MG 24 hr tablet, Take  1 tablet  at Suppertime  for OverActive Bladder   pantoprazole (PROTONIX) 40 MG tablet, Take  1 tablet  Daily  to Prevent Heartburn &  Indigestion   zinc gluconate 50 MG tablet, Take 50 mg by mouth daily. Per patient taking 25 mg daily  Allergies Allergies  Allergen Reactions   Ciprofloxacin    Levaquin [Levofloxacin In D5w]     No fluroquinolones due to ascending thoracic aortic aneurysm   Penicillins Other (See Comments)    Had as a child=unknown reaction   Zoloft [Sertraline Hcl]     Current Problems (verified) Patient Active Problem List   Diagnosis Date Noted   OAB (overactive bladder) 12/18/2019   Melasma 12/17/2019   Aortic atherosclerosis (HCC) by CTA 04/2019 06/05/2019   Osteoporosis 05/08/2018   DJD (degenerative joint disease) 05/08/2018   Thoracic aortic aneurysm (HCC) 08/13/2016   Morbid obesity (HCC) - BMI 35+ with htn, hld 03/16/2015   Atrial fibrillation (HCC) 10/29/2013   Medication management 02/18/2013   Hypertension    Hyperlipidemia    Abnormal glucose    Vitamin D deficiency    IBS (irritable bowel syndrome)    Insomnia     Screening Tests Health Maintenance  Topic Date Due   Zoster Vaccines- Shingrix (1 of 2) 10[birth year]11/1969   COVID-19 Vaccine (5 - 2023-24 season)  12/04/2021   Colonoscopy  11/04/2022   INFLUENZA VACCINE  11/04/2022   DEXA SCAN  05/09/2023   Medicare Annual Wellness (AWV)  11/17/2023   MAMMOGRAM  08/25/2024   DTaP/Tdap/Td (3 - Td or Tdap) 10/26/2026   Pneumonia Vaccine 18+ Years old  Completed   Hepatitis C Screening  Completed   HPV VACCINES  Aged Out   Immunization History  Administered Date(s) Administered   Fluad Quad(high Dose 65+) 12/15/2018, 01/22/2020   Influenza Split 02/19/2013   Influenza, High Dose Seasonal PF 01/25/2018, 02/16/2022   Influenza, Seasonal, Injecte, Preservative Fre 03/12/2015   Influenza,inj,Quad PF,6+ Mos 01/27/2017   Influenza-Unspecified 04/06/2011   PFIZER(Purple Top)SARS-COV-2 Vaccination 04/27/2019, 05/18/2019, 12/14/2019, 08/26/2020   PPD Test 10/15/2013   Pneumococcal Conjugate-13 01/25/2018   Pneumococcal Polysaccharide-23 09/13/2019   Pneumococcal-Unspecified 04/06/1995   Td 10/25/2016   Tdap 04/05/2006   Zoster, Live 07/04/2013   Preventative care: Last colonoscopy: 2014 10 year recall.  Last mammogram: 08/2022 Last pap smear/pelvic exam: 08/2019 at GYN, normal, DONE DEXA:  05/08/2021 -1.8 Osteopenia Stress test 2018 Dr. Eden Emms, low risk study EF 86% Echo 2015  Prior vaccinations: TD or Tdap: 2018  Influenza: 2023 Pneumococcal: 1997 Prevnar13: 2019  Shingles/Zostavax: 2015  Covid 19: 2/2, 2021, pfizer  Names of Other Physician/Practitioners you currently use: 1. [facility] here for primary care 2. Dr. [place], eye doctor, last visit 10/2022 3. [facility], Dr. Margot Chimes, dentist, last visit 05/2022   Patient Care Team: Lucky Cowboy, MD as PCP - General (Internal Medicine) Wendall Stade, MD as PCP - Cardiology (Cardiology) Wendall Stade, MD as Consulting Physician (Cardiology) [place], Heather, OD (Optometry)  SURGICAL HISTORY She  has a past surgical history that includes Cesarean section; Cholecystectomy (10 years ago); Tubal  ligation (Bilateral); and Cesarean section. FAMILY HISTORY Her family history includes Alcohol abuse in her father; Diabetes in her mother; Heart attack in her father and mother; Heart disease in her mother; Macular degeneration in her mother; Ovarian cancer in her maternal grandmother; Stroke in her mother. SOCIAL HISTORY She  reports that she quit smoking about 18 years ago. Her smoking use included cigarettes. She has never used smokeless tobacco. She reports current alcohol use. She reports that she does not use drugs.   MEDICARE WELLNESS OBJECTIVES: Physical activity: Current Exercise Habits: Home exercise routine Cardiac risk factors:   Depression/mood screen:      11/17/2022   12:28 PM  Depression screen PHQ 2/9  Decreased Interest 0  Down, Depressed, Hopeless 0  PHQ - 2 Score 0    ADLs:     11/17/2022   12:28 PM 07/05/2022   10:37 PM  In your present state of health, do you have any difficulty performing the following activities:  Hearing? 0 0  Vision? 0 0  Difficulty concentrating or making decisions? 0 0  Walking or climbing stairs? 0 0  Dressing or bathing? 0 0  Doing errands, shopping? 0 0     Cognitive Testing  Alert? Yes  Normal Appearance?Yes  Oriented to person? Yes  Place? Yes   Time? Yes  Recall of three objects?  Yes  Can perform simple calculations? Yes  Displays appropriate judgment?Yes  Can read the correct time from a watch face?Yes  EOL planning: Does Patient Have a Medical Advance Directive?: No  Review of Systems  Constitutional:  Negative for malaise/fatigue and weight loss.  HENT:  Negative for hearing loss and tinnitus.   Eyes:  Negative for blurred vision and double vision.  Respiratory:  Negative for cough, sputum production, shortness of breath and wheezing.   Cardiovascular:  Negative for chest pain, palpitations, orthopnea, claudication, leg swelling and PND.  Gastrointestinal:  Negative for abdominal pain, blood in stool, constipation,  diarrhea, heartburn, melena, nausea and vomiting.  Genitourinary: Negative.   Musculoskeletal:  Negative for falls, joint pain and myalgias.  Skin:  Negative for rash.  Neurological:  Negative for dizziness, tingling, sensory change, weakness and headaches.  Endo/Heme/Allergies:  Negative for polydipsia.  Psychiatric/Behavioral: Negative.  Negative for depression, memory loss, substance abuse and suicidal ideas. The patient is not nervous/anxious and does not have insomnia.   All other systems reviewed and are negative.    Objective:     Today's Vitals   11/17/22 1129  BP: 130/70  Pulse: (!) 55  Temp: 97.6 F (36.4 C)  SpO2: 99%  Weight: 238 lb 6.4 oz (108.1 kg)  Height: 5\' 8"  (  1.727 m)   Body mass index is 36.25 kg/m.  General appearance: alert, no distress, WD/WN, female HEENT: normocephalic, sclerae anicteric, TMs pearly, nares patent, no discharge or erythema, pharynx normal Oral cavity: MMM, no lesions Neck: supple, no lymphadenopathy, no thyromegaly, no masses Heart: RRR, normal S1, S2, no murmurs Lungs: CTA bilaterally, no wheezes, rhonchi, or rales Abdomen: +bs, soft, non tender, non distended, no masses, no hepatomegaly, no splenomegaly Musculoskeletal: nontender, no swelling, no obvious deformity Extremities: no edema, no cyanosis, no clubbing Pulses: 2+ symmetric, upper and lower extremities, normal cap refill Neurological: alert, oriented x 3, CN2-12 intact, strength normal upper extremities and lower extremities, sensation normal throughout, DTRs 2+ throughout, no cerebellar signs, gait normal Psychiatric: normal affect, behavior normal, pleasant   Medicare Attestation I have personally reviewed: The patient's medical and social history Their use of alcohol, tobacco or illicit drugs Their current medications and supplements The patient's functional ability including ADLs,fall risks, home safety risks, cognitive, and hearing and visual impairment Diet and  physical activities Evidence for depression or mood disorders  The patient's weight, height, BMI, and visual acuity have been recorded in the chart.  I have made referrals, counseling, and provided education to the patient based on review of the above and I have provided the patient with a written personalized care plan for preventive services.     Adela Glimpse, NP   11/17/2022

## 2022-11-17 NOTE — Patient Instructions (Signed)

## 2022-11-18 LAB — CBC WITH DIFFERENTIAL/PLATELET
Absolute Monocytes: 713 {cells}/uL (ref 200–950)
Basophils Absolute: 53 {cells}/uL (ref 0–200)
Basophils Relative: 0.8 %
Eosinophils Absolute: 238 {cells}/uL (ref 15–500)
Eosinophils Relative: 3.6 %
HCT: 39.9 % (ref 35.0–45.0)
Hemoglobin: 12.9 g/dL (ref 11.7–15.5)
Lymphs Abs: 1802 {cells}/uL (ref 850–3900)
MCH: 28.8 pg (ref 27.0–33.0)
MCHC: 32.3 g/dL (ref 32.0–36.0)
MCV: 89.1 fL (ref 80.0–100.0)
MPV: 11.4 fL (ref 7.5–12.5)
Monocytes Relative: 10.8 %
Neutro Abs: 3795 {cells}/uL (ref 1500–7800)
Neutrophils Relative %: 57.5 %
Platelets: 289 10*3/uL (ref 140–400)
RBC: 4.48 10*6/uL (ref 3.80–5.10)
RDW: 12.2 % (ref 11.0–15.0)
Total Lymphocyte: 27.3 %
WBC: 6.6 10*3/uL (ref 3.8–10.8)

## 2022-11-18 LAB — HEMOGLOBIN A1C
Hgb A1c MFr Bld: 5.7 %{Hb} — ABNORMAL HIGH (ref <5.7)
Mean Plasma Glucose: 117 mg/dL
eAG (mmol/L): 6.5 mmol/L

## 2022-11-18 LAB — COMPLETE METABOLIC PANEL WITH GFR
AG Ratio: 1.3 (calc) (ref 1.0–2.5)
ALT: 15 U/L (ref 6–29)
AST: 21 U/L (ref 10–35)
Albumin: 4.3 g/dL (ref 3.6–5.1)
Alkaline phosphatase (APISO): 74 U/L (ref 37–153)
BUN: 15 mg/dL (ref 7–25)
CO2: 30 mmol/L (ref 20–32)
Calcium: 9.8 mg/dL (ref 8.6–10.4)
Chloride: 102 mmol/L (ref 98–110)
Creat: 0.71 mg/dL (ref 0.60–1.00)
Globulin: 3.2 g/dL (ref 1.9–3.7)
Glucose, Bld: 102 mg/dL — ABNORMAL HIGH (ref 65–99)
Potassium: 5.1 mmol/L (ref 3.5–5.3)
Sodium: 140 mmol/L (ref 135–146)
Total Bilirubin: 0.6 mg/dL (ref 0.2–1.2)
Total Protein: 7.5 g/dL (ref 6.1–8.1)
eGFR: 91 mL/min/{1.73_m2} (ref >=60)

## 2022-11-18 LAB — LIPID PANEL
Cholesterol: 149 mg/dL (ref <200)
HDL: 51 mg/dL (ref >=50)
LDL Cholesterol (Calc): 73 mg/dL
Non-HDL Cholesterol (Calc): 98 mg/dL (ref <130)
Total CHOL/HDL Ratio: 2.9 (calc) (ref <5.0)
Triglycerides: 168 mg/dL — ABNORMAL HIGH (ref <150)

## 2022-11-29 ENCOUNTER — Other Ambulatory Visit: Payer: Self-pay | Admitting: Cardiovascular Disease

## 2022-11-29 DIAGNOSIS — I480 Paroxysmal atrial fibrillation: Secondary | ICD-10-CM

## 2022-11-29 NOTE — Telephone Encounter (Signed)
Prescription refill request for Eliquis received. Indication:AFIB Last office visit:7/24 Scr:0.71  8/24 Age: 72 Weight:108.1  kg  Prescription refilled

## 2022-12-09 ENCOUNTER — Ambulatory Visit: Payer: Medicare Other | Admitting: Nurse Practitioner

## 2022-12-23 ENCOUNTER — Ambulatory Visit (HOSPITAL_BASED_OUTPATIENT_CLINIC_OR_DEPARTMENT_OTHER)
Admission: RE | Admit: 2022-12-23 | Discharge: 2022-12-23 | Disposition: A | Discharge status: Home / Self Care | Payer: Medicare Other | Source: Ambulatory Visit | Attending: Cardiology | Admitting: Cardiology

## 2022-12-23 ENCOUNTER — Encounter (HOSPITAL_COMMUNITY): Payer: Self-pay | Admitting: Cardiology

## 2022-12-23 ENCOUNTER — Ambulatory Visit (HOSPITAL_COMMUNITY)
Admission: RE | Admit: 2022-12-23 | Discharge: 2022-12-23 | Disposition: A | Discharge status: Home / Self Care | Payer: Medicare Other | Source: Ambulatory Visit | Attending: Internal Medicine | Admitting: Internal Medicine

## 2022-12-23 ENCOUNTER — Other Ambulatory Visit (HOSPITAL_COMMUNITY): Payer: Self-pay

## 2022-12-23 VITALS — BP 150/80 | HR 46 | Wt 243.0 lb

## 2022-12-23 DIAGNOSIS — R0602 Shortness of breath: Secondary | ICD-10-CM

## 2022-12-23 DIAGNOSIS — Z7901 Long term (current) use of anticoagulants: Secondary | ICD-10-CM | POA: Diagnosis not present

## 2022-12-23 DIAGNOSIS — E785 Hyperlipidemia, unspecified: Secondary | ICD-10-CM

## 2022-12-23 DIAGNOSIS — R931 Abnormal findings on diagnostic imaging of heart and coronary circulation: Secondary | ICD-10-CM | POA: Insufficient documentation

## 2022-12-23 DIAGNOSIS — I10 Essential (primary) hypertension: Secondary | ICD-10-CM

## 2022-12-23 DIAGNOSIS — Z5181 Encounter for therapeutic drug level monitoring: Secondary | ICD-10-CM

## 2022-12-23 DIAGNOSIS — I361 Nonrheumatic tricuspid (valve) insufficiency: Secondary | ICD-10-CM | POA: Diagnosis not present

## 2022-12-23 DIAGNOSIS — I272 Pulmonary hypertension, unspecified: Secondary | ICD-10-CM | POA: Diagnosis not present

## 2022-12-23 DIAGNOSIS — R001 Bradycardia, unspecified: Secondary | ICD-10-CM | POA: Insufficient documentation

## 2022-12-23 DIAGNOSIS — I480 Paroxysmal atrial fibrillation: Secondary | ICD-10-CM

## 2022-12-23 DIAGNOSIS — I2721 Secondary pulmonary arterial hypertension: Secondary | ICD-10-CM | POA: Insufficient documentation

## 2022-12-23 DIAGNOSIS — I7123 Aneurysm of the descending thoracic aorta, without rupture: Secondary | ICD-10-CM | POA: Diagnosis not present

## 2022-12-23 LAB — COMPREHENSIVE METABOLIC PANEL
ALT: 30 U/L (ref 0–44)
AST: 33 U/L (ref 15–41)
Albumin: 3.6 g/dL (ref 3.5–5.0)
Alkaline Phosphatase: 64 U/L (ref 38–126)
Anion gap: 8 (ref 5–15)
BUN: 23 mg/dL (ref 8–23)
CO2: 26 mmol/L (ref 22–32)
Calcium: 9.4 mg/dL (ref 8.9–10.3)
Chloride: 101 mmol/L (ref 98–111)
Creatinine, Ser: 0.79 mg/dL (ref 0.44–1.00)
GFR, Estimated: >60 mL/min (ref >60)
Glucose, Bld: 124 mg/dL — ABNORMAL HIGH (ref 70–99)
Potassium: 5 mmol/L (ref 3.5–5.1)
Sodium: 135 mmol/L (ref 135–145)
Total Bilirubin: 1 mg/dL (ref 0.3–1.2)
Total Protein: 7 g/dL (ref 6.5–8.1)

## 2022-12-23 LAB — CBC
HCT: 36.1 % (ref 36.0–46.0)
Hemoglobin: 12.2 g/dL (ref 12.0–15.0)
MCH: 29.5 pg (ref 26.0–34.0)
MCHC: 33.8 g/dL (ref 30.0–36.0)
MCV: 87.2 fL (ref 80.0–100.0)
Platelets: 272 10*3/uL (ref 150–400)
RBC: 4.14 MIL/uL (ref 3.87–5.11)
RDW: 13.4 % (ref 11.5–15.5)
WBC: 10.8 10*3/uL — ABNORMAL HIGH (ref 4.0–10.5)
nRBC: 0 % (ref 0.0–0.2)

## 2022-12-23 LAB — ECHOCARDIOGRAM COMPLETE: S' Lateral: 2.8 cm

## 2022-12-23 LAB — BRAIN NATRIURETIC PEPTIDE: B Natriuretic Peptide: 306.8 pg/mL — ABNORMAL HIGH (ref 0.0–100.0)

## 2022-12-23 NOTE — Patient Instructions (Signed)
Medication Changes:  No Changes In Medications at this time.   Lab Work:  Labs done today, your results will be available in MyChart, we will contact you for abnormal readings.  Testing/Procedures:  WE WILL CALL YOU REGARDING THE ITAMAR SLEEP STUDY   Special Instructions // Education:   [facility] [address] [place] [address] Dept: [phone] Loc: [phone]  RHEANN BOEHNLEIN  12/23/2022  You are scheduled for a Cardiac Catheterization on Tuesday, October 29 with Dr.  Gasper Lloyd .  1. Please arrive at the [facility] (Main Entrance A) at [facility]: [address] [place], [place] [address] at 11:00 AM (This time is 2 hour(s) before your procedure to ensure your preparation). Free valet parking service is available. You will check in at ADMITTING. The support person will be asked to wait in the waiting room.  It is OK to have someone drop you off and come back when you are ready to be discharged.    Special note: Every effort is made to have your procedure done on time. Please understand that emergencies sometimes delay scheduled procedures.  2. Diet: Do not eat solid foods after midnight.  The patient may have clear liquids until 5am upon the day of the procedure.  3. Labs: You will need to have blood drawn on TODAY  4. Medication instructions in preparation for your procedure:   Contrast Allergy: No  DO NOT TAKE ELIQUIS THE DAY BEFORE PROCEDURE OR THE MORNING OF PROCEDURE   DO NOT TAKE HYDROCHLOROTHIAZIDE THE MORNING OF PROCEDURE   5. Plan to go home the same day, you will only stay overnight if medically necessary. 6. Bring a current list of your medications and current insurance cards. 7. You MUST have a responsible person to drive you home. 8. Someone MUST be with you the first 24 hours after you arrive home or your discharge will be delayed. 9. Please wear clothes  that are easy to get on and off and wear slip-on shoes.  Thank you for allowing [place] to care for you!   -- [facility]  Follow-Up in: 3 MONTHS AS SCHEDULED   At the [facility], you and your health needs are our priority. We have a designated team specialized in the treatment of Heart Failure. This Care Team includes your primary Heart Failure Specialized Cardiologist (physician), Advanced Practice Providers (APPs- Physician Assistants and Nurse Practitioners), and Pharmacist who all work together to provide you with the care you need, when you need it.   You may see any of the following providers on your designated Care Team at your next follow up:  Dr. Arvilla Meres Dr. Marca Ancona Dr. Marcos Eke, NP Robbie Lis, [place] [facility] [place], [place] Brynda Peon, NP Karle Plumber, PharmD   Please be sure to bring in all your medications bottles to every appointment.   Need to Contact us:  If you have any questions or concerns before your next appointment please send [place] a message through [place] or call our office at [phone].    TO LEAVE A MESSAGE FOR THE NURSE SELECT OPTION 2, PLEASE LEAVE A MESSAGE INCLUDING: YOUR NAME DATE OF BIRTH CALL BACK NUMBER REASON FOR CALL**this is important as we prioritize the call backs  YOU WILL RECEIVE A CALL BACK THE SAME DAY AS LONG AS YOU CALL BEFORE 4:00 PM

## 2022-12-23 NOTE — Progress Notes (Signed)
ADVANCED HEART FAILURE CLINIC NOTE  Referring Physician: Lucky Cowboy, MD  Primary Care: Lucky Cowboy, MD Primary Cardiologist: Charlton Haws, MD  HPI: Claudia [name] is a 72 y.o. female with hypertension, paroxysmal atrial fibrillation, type 2 diabetes, obstructive sleep apnea presenting today to establish care and for evaluation of pulmonary hypertension.  Her cardiac history dates back to April 2015 when she presented with complaints of palpitations.  Echocardiogram at that time with a EF of 60 to 65%.  She was diagnosed with paroxysmal atrial fibrillation and started on Eliquis.  She reports doing fairly well until she gained a lot of weight over the summer due to poor nutrition.  She reports that during this time she started to have shortness of breath and lower extremity edema.  She had an echocardiogram with RVSP approaching 70 mmHg.  She presents today for evaluation of pulmonary hypertension.   Interval hx:  - Reports having worsening shortness of breath with exertion over the summer that she belives was due to weight gain and poor nutrition. Since that time she has improved her diet and reports resolution of shortness of breaht. She now virtually has no shortness of breath; no PND. She has no rheumatologic history that she is aware of or family history of rheumatologic disease.   Activity level/exercise tolerance: NYHA IIb Orthopnea:  Sleeps on 2 pillows Paroxysmal noctural dyspnea: No Chest pain/pressure:  no Orthostatic lightheadedness:  no Palpitations:  no Lower extremity edema:  no Presyncope/syncope:  no Cough:  no  Past Medical History:  Diagnosis Date   Achilles tendonitis    R heal, limits activity   Allergy    Anxiety    Elevated hemoglobin A1c    Hyperlipidemia    Hypertension    IBS (irritable bowel syndrome)    Insomnia    Obesity    Palpitations    SVT (supraventricular tachycardia) 10/29/2013   Vitamin D deficiency     Current Outpatient  Medications  Medication Sig Dispense Refill   Ascorbic Acid (VITAMIN C PO) Take by mouth 2 (two) times daily.     Cholecalciferol (VITAMIN D-3 PO) Take 8,000 Units by mouth daily.     CINNAMON PO Take 1,000 mg by mouth daily.      diclofenac Sodium (VOLTAREN) 1 % GEL Apply 4 g topically 4 (four) times daily as needed.     DiphenhydrAMINE HCl (BENADRYL ALLERGY PO) Take by mouth daily.     ELIQUIS 5 MG TABS tablet TAKE 1 TABLET TWICE A DAY 180 tablet 3   Glucosamine 750 MG TABS Take 750 mg by mouth in the morning and at bedtime.     hydrochlorothiazide (HYDRODIURIL) 25 MG tablet TAKE 1 TABLET DAILY FOR BLOOD PRESSURE AND FLUID RETENTION, ANKLE SWELLING 90 tablet 3   lidocaine (LIDODERM) 5 % as needed.     loperamide (IMODIUM A-D) 2 MG tablet Take 6 mg by mouth daily.     Menaquinone-7 (VITAMIN K2 PO) Take 100 mcg by mouth daily.     metoprolol succinate (TOPROL-XL) 25 MG 24 hr tablet TAKE ONE-HALF (1/2) TABLET DAILY 45 tablet 3   olmesartan (BENICAR) 40 MG tablet Take  1 tablet  at Night  for BP 90 tablet 3   omega-3 acid ethyl esters (LOVAZA) 1 g capsule Take 1 g by mouth daily.     oxybutynin (DITROPAN XL) 10 MG 24 hr tablet Take  1 tablet  at Suppertime  for OverActive Bladder 90 tablet 3   pantoprazole (PROTONIX)  40 MG tablet Take  1 tablet  Daily  to Prevent Heartburn &  Indigestion 90 tablet 3   pravastatin (PRAVACHOL) 20 MG tablet Take 1 tablet (20 mg total) by mouth daily. 30 tablet 0   PREDNISONE PO Take 4 mg by mouth daily.     vitamin B-12 (CYANOCOBALAMIN) 1000 MCG tablet Take 1,000 mcg by mouth daily.     zinc gluconate 50 MG tablet Take 25 mg by mouth daily.     No current facility-administered medications for this encounter.    Allergies  Allergen Reactions   Ciprofloxacin    Levaquin [Levofloxacin In D5w]     No fluroquinolones due to ascending thoracic aortic aneurysm   Penicillins Other (See Comments)    Had as a child=unknown reaction   Zoloft [Sertraline Hcl]        Social History   Socioeconomic History   Marital status: Married    Spouse name: Not on file   Number of children: Not on file   Years of education: Not on file   Highest education level: Not on file  Occupational History   Not on file  Tobacco Use   Smoking status: Former    Current packs/day: 0.00    Types: Cigarettes    Quit date: 04/01/2004    Years since quitting: 18.7   Smokeless tobacco: Never   Tobacco comments:    Social smoker times 10 years  Vaping Use   Vaping status: Never Used  Substance and Sexual Activity   Alcohol use: Yes    Alcohol/week: 0.0 standard drinks of alcohol    Comment: rarely, < 1 beer per week   Drug use: No   Sexual activity: Not on file  Other Topics Concern   Not on file  Social History Narrative   Pt lives in [place] with spouse.  Retired from [employer].   Social Determinants of Health   Financial Resource Strain: Not on file  Food Insecurity: Not on file  Transportation Needs: Not on file  Physical Activity: Not on file  Stress: Not on file  Social Connections: Not on file  Intimate Partner Violence: Not on file      Family History  Problem Relation Age of Onset   Heart attack Mother    Stroke Mother    Heart disease Mother    Diabetes Mother    Macular degeneration Mother    Heart attack Father    Alcohol abuse Father    Ovarian cancer Maternal Grandmother    Colon cancer Neg Hx     PHYSICAL EXAM: Vitals:   12/23/22 1107  BP: (!) 150/80  Pulse: (!) 46  SpO2: 98%   GENERAL: Well nourished, well developed, and in no apparent distress at rest.  HEENT: Negative for arcus senilis or xanthelasma. There is no scleral icterus.  The mucous membranes are pink and moist.   NECK: Supple, No masses. Normal carotid upstrokes without bruits. No masses or thyromegaly.    CHEST: There are no chest wall deformities. There is no chest wall tenderness. Respirations are unlabored.  Lungs- CTA B/L CARDIAC:  JVP: 8 cm  H2O         Normal S1, S2  Normal rate with regular rhythm. No murmurs, rubs or gallops.  Pulses are 2+ and symmetrical in upper and lower extremities. 1+ edema.  ABDOMEN: Soft, non-tender, non-distended. There are no masses or hepatomegaly. There are normal bowel sounds.  EXTREMITIES: Warm and well perfused with no cyanosis,  clubbing.  LYMPHATIC: No axillary or supraclavicular lymphadenopathy.  NEUROLOGIC: Patient is oriented x3 with no focal or lateralizing neurologic deficits.  PSYCH: Patients affect is appropriate, there is no evidence of anxiety or depression.  SKIN: Warm and dry; no lesions or wounds.   DATA REVIEW  ECG: NSR  As per my personal interpretation  ECHO: 12/24/22: LVEF 75-70%, normal RV function with PASP of As per my personal interpretation   ASSESSMENT & PLAN:  Evaluation for pulmonary hypertension -Reports progression of shortness of breath over the summer when she gained significant weight. -Echocardiogram today (12/23/2022) with RVSP approaching 70 mmHg.  Left atrium is also mild to moderately elevated.  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 -After lengthy discussion with her regarding noninvasive evaluation with echocardiograms versus right heart catheterization, we will proceed with right heart cath for further evaluation. -Euvolemic on exam.  No medication changes at this time.  2.  Paroxysmal atrial fibrillation -Continue metoprolol 25 mg daily -Continue apixaban 5 mg twice daily. -Continue follow-up with cardiology.  3.  Hypertension -Continue olmesartan 40 mg daily. - continue hydrochlorothiazide 25mg  daily.   4.  Hyperlipidemia -Continue pravastatin 20 mg daily.  5. Right atrial echodensity - Reported on TTE from 12/23/22 - Will discuss CMR.    Corian Handley Advanced Heart Failure Mechanical Circulatory Support

## 2022-12-23 NOTE — Progress Notes (Signed)
Orders placed for RHC on 10/29 with Dr. Gasper Lloyd

## 2022-12-28 ENCOUNTER — Encounter (HOSPITAL_COMMUNITY): Payer: Self-pay | Admitting: Cardiology

## 2023-01-11 ENCOUNTER — Other Ambulatory Visit: Payer: Self-pay

## 2023-01-11 DIAGNOSIS — I10 Essential (primary) hypertension: Secondary | ICD-10-CM

## 2023-01-11 MED ORDER — OLMESARTAN MEDOXOMIL 40 MG PO TABS: ORAL_TABLET | ORAL | 3 refills | Status: DC

## 2023-01-20 ENCOUNTER — Encounter: Payer: Self-pay | Admitting: Internal Medicine

## 2023-01-20 ENCOUNTER — Ambulatory Visit (INDEPENDENT_AMBULATORY_CARE_PROVIDER_SITE_OTHER): Payer: Medicare Other | Admitting: Internal Medicine

## 2023-01-20 VITALS — BP 138/80 | HR 85 | Temp 97.9°F | Resp 16 | Ht 68.0 in | Wt 242.8 lb

## 2023-01-20 DIAGNOSIS — I700 Atherosclerosis of aorta: Secondary | ICD-10-CM | POA: Diagnosis not present

## 2023-01-20 DIAGNOSIS — I480 Paroxysmal atrial fibrillation: Secondary | ICD-10-CM

## 2023-01-20 DIAGNOSIS — R7309 Other abnormal glucose: Secondary | ICD-10-CM | POA: Diagnosis not present

## 2023-01-20 DIAGNOSIS — E782 Mixed hyperlipidemia: Secondary | ICD-10-CM | POA: Diagnosis not present

## 2023-01-20 DIAGNOSIS — E559 Vitamin D deficiency, unspecified: Secondary | ICD-10-CM | POA: Diagnosis not present

## 2023-01-20 DIAGNOSIS — I10 Essential (primary) hypertension: Secondary | ICD-10-CM | POA: Diagnosis not present

## 2023-01-20 DIAGNOSIS — Z79899 Other long term (current) drug therapy: Secondary | ICD-10-CM | POA: Diagnosis not present

## 2023-01-20 NOTE — Patient Instructions (Signed)

## 2023-01-20 NOTE — Progress Notes (Signed)
Future Appointments  Date Time Provider Department  01/20/2023                     6 mo   2:30 PM Lucky Cowboy, MD [facility]  03/16/2023 11:00 AM Dorthula Nettles, DO [facility]  05/19/2023                   wellness  2:30 PM Adela Glimpse, NP [facility]  08/18/2023                   cpe  2:00 PM Lucky Cowboy, MD [facility]  11/18/2023                    3 mo  10:30 AM Adela Glimpse, NP [facility]    History of Present Illness:       This very nice 72 y.o. MWF  presents for 3 month follow up with HTN, pAfib, HLD, Pre-Diabetes and Vitamin D Deficiency.  CTA in 2021 showed Aortic Atherosclerosis .       Patient is treated for HTN  since  1996 & BP has been controlled at home. Today's BP is at goal - 138/80.  In 2004,  she was started on Eliquis for pAfib .   Patient has had no complaints of any cardiac type chest pain, palpitations, dyspnea Pollyann Kennedy /PND, dizziness, claudication or dependent edema.       Hyperlipidemia is not controlled with diet & Pravastatin. Patient denies myalgias or other med SE's. Last Lipids were at goal with sl elevated Trig's :  Lab Results  Component Value Date   CHOL 149 11/17/2022   HDL 51 11/17/2022   LDLCALC 73 11/17/2022   TRIG 168 (H) 11/17/2022   CHOLHDL 2.9 11/17/2022     Also, the patient has history of PreDiabetes (A1c 5.8% /2012) and has had no symptoms of reactive hypoglycemia, diabetic polys, paresthesias or visual blurring.  Last A1c was near goal :  Lab Results  Component Value Date   HGBA1C 5.7 (H) 11/17/2022                                                       Further, the patient also has history of Vitamin D Deficiency ("18" /2008) & supplements vitamin D . Last vitamin D was at goal :  Lab Results  Component Value Date   VD25OH 90 07/06/2022       Current Outpatient Medications  Medication Instructions   VITAMIN C   500 mg  2 times daily   VITAMIN D  4,000 Units  2 times daily   CINNAMON  1,000 mg 1  Daily at bedtime    VITAMIN B-12   500 mcg Every morning   diclofenac   4 g  Topical 4 times daily PRN   diphenhydramine  25 mg Daily at bedtime   Eliquis  5 mg 2 times daily   Fish Oil  1,000 mg Daily at bedtime   Glucosamine  750 mg 2 times daily   HOMEOPATHIC PRODUCTS EX 1 patch, Transdermal, Daily, Natural Knee Pain Patch-Herbal Knee Patches (Peppermint/Capsicum/Camphor/Ginger/Safflower/[place] extracts) Applied knee/heel.   hydrochlorothiazide  25 MG tablet TAKE 1 TABLET DAILY    LIDODERM 5 % 1 patch, Transdermal, Daily PRN   loperamide (IMODIUM)  6 mg Oral,  Every morning   VITAMIN K2 / MK7  100 mcg, Every morning   metoprolol succinate-XL  12.5 mg Daily   olmesartan  40 MG tablet Take  1 tablet  at Night  for BP   oxybutynin XL 10 MG  Take  1 tablet  at Suppertime    pantoprazole 40 MG tablet Take  1 tablet  Daily     pravastatin  20 mg Daily   ZINC  25 mg Daily at bedtime     Allergies  Allergen Reactions   Ciprofloxacin    Levaquin [Levofloxacin In D5w]     No fluroquinolones due to ascending thoracic aortic aneurysm   Penicillins Other (See Comments)    Had as a child=unknown reaction   Zoloft [Sertraline Hcl]      PMHx:   Past Medical History:  Diagnosis Date   Achilles tendonitis    R heal, limits activity   Allergy    Anxiety    Elevated hemoglobin A1c    Hyperlipidemia    Hypertension    IBS (irritable bowel syndrome)    Insomnia    Obesity    Palpitations    SVT (supraventricular tachycardia) (HCC) 10/29/2013   Vitamin D deficiency      Immunization History  Administered Date(s) Administered   Fluad Quad(high Dose) 12/15/2018, 01/22/2020   Influenza Split 02/19/2013   Influenza, High Dose  01/25/2018   Influenza, Seasonal 03/12/2015   Influenza,inj,Quad  01/27/2017   Influenza 04/06/2011   PFIZER-SARS-COV-2 Vacc 04/27/2019, 05/18/2019, 12/14/2019, 08/26/2020   PPD Test 10/15/2013   Pneumococcal - 13 01/25/2018   Pneumococcal - 23 09/13/2019    Pneumococcal - 23 04/06/1995   Td 10/25/2016   Tdap 04/05/2006   Zoster, Live 07/04/2013     Past Surgical History:  Procedure Laterality Date   CESAREAN SECTION     CESAREAN SECTION     CHOLECYSTECTOMY  10 years ago   TUBAL LIGATION Bilateral     FHx:    Reviewed / unchanged  SHx:    Reviewed / unchanged   Systems Review:  Constitutional: Denies fever, chills, wt changes, headaches, insomnia, fatigue, night sweats, change in appetite. Eyes: Denies redness, blurred vision, diplopia, discharge, itchy, watery eyes.  ENT: Denies discharge, congestion, post nasal drip, epistaxis, sore throat, earache, hearing loss, dental pain, tinnitus, vertigo, sinus pain, snoring.  CV: Denies chest pain, palpitations, irregular heartbeat, syncope, dyspnea, diaphoresis, orthopnea, PND, claudication or edema. Respiratory: denies cough, dyspnea, DOE, pleurisy, hoarseness, laryngitis, wheezing.  Gastrointestinal: Denies dysphagia, odynophagia, heartburn, reflux, water brash, abdominal pain or cramps, nausea, vomiting, bloating, diarrhea, constipation, hematemesis, melena, hematochezia  or hemorrhoids. Genitourinary: Denies dysuria, nocturia, hesitancy, discharge, hematuria or flank pain.                           C/o sx's consistent with OAB, frequency & urge incontinence . Musculoskeletal: Denies arthralgias, myalgias, stiffness, jt. swelling, pain, limping or strain/sprain.  Skin: Denies pruritus, rash, hives, warts, acne, eczema or change in skin lesion(s). Neuro: No weakness, tremor, incoordination, spasms, paresthesia or pain. Psychiatric: Denies confusion, memory loss or sensory loss. Endo: Denies change in weight, skin or hair change.  Heme/Lymph: No excessive bleeding, bruising or enlarged lymph nodes.  Physical Exam  BP 138/80   Pulse 85   Temp 97.9 F (36.6 C)   Resp 16   Ht 5\' 8"  (1.727 m)   Wt 242 lb 12.8 oz (110.1 kg)   SpO2 98%  BMI 36.92 kg/m   Appears  well nourished,  well groomed  and in no distress.  Eyes: PERRLA, EOMs, conjunctiva no swelling or erythema. Sinuses: No frontal/maxillary tenderness ENT/Mouth: EAC's clear, TM's nl w/o erythema, bulging. Nares clear w/o erythema, swelling, exudates. Oropharynx clear without erythema or exudates. Oral hygiene is good. Tongue normal, non obstructing. Hearing intact.  Neck: Supple. Thyroid not palpable. Car 2+/2+ without bruits, nodes or JVD. Chest: Respirations nl with BS clear & equal w/o rales, rhonchi, wheezing or stridor.  Cor: Heart sounds normal w/ regular rate and rhythm without sig. murmurs, gallops, clicks or rubs. Peripheral pulses normal and equal  without edema.  Abdomen: Soft & bowel sounds normal. Non-tender w/o guarding, rebound, hernias, masses or organomegaly.  Lymphatics: Unremarkable.  Musculoskeletal: Full ROM all peripheral extremities, joint stability, 5/5 strength and normal gait.  Skin: Warm, dry without exposed rashes, lesions or ecchymosis apparent.  Neuro: Cranial nerves intact, reflexes equal bilaterally. Sensory-motor testing grossly intact. Tendon reflexes grossly intact.  Pysch: Alert & oriented x 3.  Insight and judgement nl & appropriate. No ideations.  Assessment and Plan:   1. Essential hypertension  - CBC with Differential/Platelet - COMPLETE METABOLIC PANEL WITH GFR - Magnesium - Continue medication, monitor blood pressure at home.  - Continue DASH diet.  Reminder to go to the ER if any CP,  SOB, nausea, dizziness, severe HA, changes vision/speech.   - TSH   2. Hyperlipidemia, mixed  - Continue diet/meds, exercise,& lifestyle modifications.  - Continue monitor periodic cholesterol/liver & renal functions    - Lipid panel - TSH   3. Abnormal glucose  - Continue diet, exercise  - Lifestyle modifications.  - Monitor appropriate labs   - Hemoglobin A1c - Insulin, random   4. Vitamin D deficiency  - Continue supplementation   - VITAMIN D 25 Hydroxy     5. Paroxysmal atrial fibrillation (HCC)  - TSH   6. Aortic atherosclerosis (HCC) by CTA 04/2019  - Lipid panel   7. Medication management  - CBC with Differential/Platelet - COMPLETE METABOLIC PANEL WITH GFR - Magnesium - Lipid panel - TSH - Hemoglobin A1c - Insulin, random - VITAMIN D 25 Hydroxy          Discussed  regular exercise, BP monitoring, weight control to achieve/maintain BMI less than 25 and discussed med and SE's. Recommended labs to assess /monitor clinical status .   Given Rx Oxybutynin 10 mg XL to try for OAB sx's.   I discussed the assessment and treatment plan with the patient. The patient was provided an opportunity to ask questions and all were answered. The patient agreed with the plan and demonstrated an understanding of the instructions.  I provided over 30 minutes of exam, counseling, chart review and  complex critical decision making.        The patient was advised to call back or seek an in-person evaluation if the symptoms worsen or if the condition fails to improve as anticipated.   Marinus Maw, MD

## 2023-01-21 LAB — CBC WITH DIFFERENTIAL/PLATELET
Absolute Lymphocytes: 1957 {cells}/uL (ref 850–3900)
Absolute Monocytes: 891 {cells}/uL (ref 200–950)
Basophils Absolute: 52 {cells}/uL (ref 0–200)
Basophils Relative: 0.8 %
Eosinophils Absolute: 221 {cells}/uL (ref 15–500)
Eosinophils Relative: 3.4 %
HCT: 38.8 % (ref 35.0–45.0)
Hemoglobin: 12.5 g/dL (ref 11.7–15.5)
MCH: 28.4 pg (ref 27.0–33.0)
MCHC: 32.2 g/dL (ref 32.0–36.0)
MCV: 88.2 fL (ref 80.0–100.0)
MPV: 11.1 fL (ref 7.5–12.5)
Monocytes Relative: 13.7 %
Neutro Abs: 3380 {cells}/uL (ref 1500–7800)
Neutrophils Relative %: 52 %
Platelets: 303 10*3/uL (ref 140–400)
RBC: 4.4 10*6/uL (ref 3.80–5.10)
RDW: 12.3 % (ref 11.0–15.0)
Total Lymphocyte: 30.1 %
WBC: 6.5 10*3/uL (ref 3.8–10.8)

## 2023-01-21 LAB — COMPLETE METABOLIC PANEL WITH GFR
AG Ratio: 1.6 (calc) (ref 1.0–2.5)
ALT: 12 U/L (ref 6–29)
AST: 17 U/L (ref 10–35)
Albumin: 4.3 g/dL (ref 3.6–5.1)
Alkaline phosphatase (APISO): 77 U/L (ref 37–153)
BUN: 18 mg/dL (ref 7–25)
CO2: 31 mmol/L (ref 20–32)
Calcium: 9.6 mg/dL (ref 8.6–10.4)
Chloride: 99 mmol/L (ref 98–110)
Creat: 0.81 mg/dL (ref 0.60–1.00)
Globulin: 2.7 g/dL (ref 1.9–3.7)
Glucose, Bld: 94 mg/dL (ref 65–99)
Potassium: 4.8 mmol/L (ref 3.5–5.3)
Sodium: 138 mmol/L (ref 135–146)
Total Bilirubin: 0.6 mg/dL (ref 0.2–1.2)
Total Protein: 7 g/dL (ref 6.1–8.1)
eGFR: 78 mL/min/{1.73_m2} (ref >=60)

## 2023-01-21 LAB — HEMOGLOBIN A1C
Hgb A1c MFr Bld: 5.9 %{Hb} — ABNORMAL HIGH (ref <5.7)
Mean Plasma Glucose: 123 mg/dL
eAG (mmol/L): 6.8 mmol/L

## 2023-01-21 LAB — LIPID PANEL
Cholesterol: 173 mg/dL (ref <200)
HDL: 45 mg/dL — ABNORMAL LOW (ref >=50)
LDL Cholesterol (Calc): 94 mg/dL
Non-HDL Cholesterol (Calc): 128 mg/dL (ref <130)
Total CHOL/HDL Ratio: 3.8 (calc) (ref <5.0)
Triglycerides: 259 mg/dL — ABNORMAL HIGH (ref <150)

## 2023-01-21 LAB — VITAMIN D 25 HYDROXY (VIT D DEFICIENCY, FRACTURES): Vit D, 25-Hydroxy: 79 ng/mL (ref 30–100)

## 2023-01-21 LAB — MAGNESIUM: Magnesium: 2 mg/dL (ref 1.5–2.5)

## 2023-01-21 LAB — TSH: TSH: 1.65 m[IU]/L (ref 0.40–4.50)

## 2023-01-21 LAB — INSULIN, RANDOM: Insulin: 10.7 u[IU]/mL

## 2023-01-22 NOTE — Progress Notes (Signed)
<>*<>*<>*<>*<>*<>*<>*<>*<>*<>*<>*<>*<>*<>*<>*<>*<>*<>*<>*<>*<>*<>*<>*<>*<> <>*<>*<>*<>*<>*<>*<>*<>*<>*<>*<>*<>*<>*<>*<>*<>*<>*<>*<>*<>*<>*<>*<>*<>*<>  -  Test results slightly outside the reference range are not unusual. If there is anything important, I will review this with you,  otherwise it is considered normal test values.  If you have further questions,  please do not hesitate to contact me at the office or via My Chart.   <>*<>*<>*<>*<>*<>*<>*<>*<>*<>*<>*<>*<>*<>*<>*<>*<>*<>*<>*<>*<>*<>*<>*<>*<> <>*<>*<>*<>*<>*<>*<>*<>*<>*<>*<>*<>*<>*<>*<>*<>*<>*<>*<>*<>*<>*<>*<>*<>*<>  -  Chol = 173  - Great   Excellent   - Very low risk for Heart Attack  / Stroke  - But   - Triglycerides ( = 259 ) or fats in blood are too high                 (   Ideal or  Goal is less than 150  !  )    - Recommend avoid fried & greasy foods,  sweets / candy,   - Avoid white rice  (brown or wild rice or Quinoa is OK),   - Avoid white potatoes  (sweet potatoes are OK)   - Avoid anything made from white flour  - bagels, doughnuts, rolls, buns, biscuits, white and   wheat breads, pizza crust and traditional  pasta made of white flour & egg white  - (vegetarian pasta or spinach or wheat pasta is OK).    - Multi-grain bread is OK - like multi-grain flat bread or  sandwich thins.   - Avoid alcohol in excess.   - Exercise is also important.  <>*<>*<>*<>*<>*<>*<>*<>*<>*<>*<>*<>*<>*<>*<>*<>*<>*<>*<>*<>*<>*<>*<>*<>*<> <>*<>*<>*<>*<>*<>*<>*<>*<>*<>*<>*<>*<>*<>*<>*<>*<>*<>*<>*<>*<>*<>*<>*<>*<>      <>*<>*<>*<>*<>*<>*<>*<>*<>*<>*<>*<>*<>*<>*<>*<>*<>*<>*<>*<>*<>*<>*<>*<>*<> <>*<>*<>*<>*<>*<>*<>*<>*<>*<>*<>*<>*<>*<>*<>*<>*<>*<>*<>*<>*<>*<>*<>*<>*<>  -

## 2023-01-23 ENCOUNTER — Encounter: Payer: Self-pay | Admitting: Internal Medicine

## 2023-01-26 ENCOUNTER — Other Ambulatory Visit: Payer: Self-pay | Admitting: Internal Medicine

## 2023-01-26 MED ORDER — SEMAGLUTIDE-WEIGHT MANAGEMENT 0.5 MG/0.5ML ~~LOC~~ SOAJ: SUBCUTANEOUS | 0 refills | Status: DC

## 2023-01-28 ENCOUNTER — Other Ambulatory Visit: Payer: Self-pay | Admitting: Nurse Practitioner

## 2023-01-31 ENCOUNTER — Telehealth: Payer: Self-pay

## 2023-01-31 NOTE — Telephone Encounter (Signed)
The patient's Reginal Lutes has been approved by their insurance and the patient has been notified to contact their pharmacy for fill.

## 2023-02-01 ENCOUNTER — Ambulatory Visit (HOSPITAL_COMMUNITY)
Admission: RE | Admit: 2023-02-01 | Discharge: 2023-02-01 | Disposition: A | Discharge status: Home / Self Care | Payer: Medicare Other | Attending: Cardiology | Admitting: Cardiology

## 2023-02-01 ENCOUNTER — Other Ambulatory Visit: Payer: Self-pay

## 2023-02-01 ENCOUNTER — Encounter (HOSPITAL_COMMUNITY)
Admission: RE | Disposition: A | Discharge status: Home / Self Care | Payer: Self-pay | Source: Home / Self Care | Attending: Cardiology

## 2023-02-01 ENCOUNTER — Encounter (HOSPITAL_COMMUNITY): Payer: Self-pay | Admitting: Cardiology

## 2023-02-01 DIAGNOSIS — R0609 Other forms of dyspnea: Secondary | ICD-10-CM | POA: Insufficient documentation

## 2023-02-01 DIAGNOSIS — I10 Essential (primary) hypertension: Secondary | ICD-10-CM | POA: Insufficient documentation

## 2023-02-01 DIAGNOSIS — I480 Paroxysmal atrial fibrillation: Secondary | ICD-10-CM | POA: Diagnosis not present

## 2023-02-01 DIAGNOSIS — E119 Type 2 diabetes mellitus without complications: Secondary | ICD-10-CM | POA: Insufficient documentation

## 2023-02-01 DIAGNOSIS — I272 Pulmonary hypertension, unspecified: Secondary | ICD-10-CM | POA: Insufficient documentation

## 2023-02-01 DIAGNOSIS — G4733 Obstructive sleep apnea (adult) (pediatric): Secondary | ICD-10-CM | POA: Diagnosis not present

## 2023-02-01 DIAGNOSIS — Z87891 Personal history of nicotine dependence: Secondary | ICD-10-CM | POA: Diagnosis not present

## 2023-02-01 DIAGNOSIS — I2721 Secondary pulmonary arterial hypertension: Secondary | ICD-10-CM

## 2023-02-01 HISTORY — PX: RIGHT HEART CATH: CATH118263

## 2023-02-01 LAB — POCT I-STAT EG7
Acid-Base Excess: 1 mmol/L (ref 0.0–2.0)
Acid-Base Excess: 2 mmol/L (ref 0.0–2.0)
Bicarbonate: 25.9 mmol/L (ref 20.0–28.0)
Bicarbonate: 26.2 mmol/L (ref 20.0–28.0)
Calcium, Ion: 1.22 mmol/L (ref 1.15–1.40)
Calcium, Ion: 1.22 mmol/L (ref 1.15–1.40)
HCT: 36 % (ref 36.0–46.0)
HCT: 36 % (ref 36.0–46.0)
Hemoglobin: 12.2 g/dL (ref 12.0–15.0)
Hemoglobin: 12.2 g/dL (ref 12.0–15.0)
O2 Saturation: 69 %
O2 Saturation: 69 %
Potassium: 3.7 mmol/L (ref 3.5–5.1)
Potassium: 3.8 mmol/L (ref 3.5–5.1)
Sodium: 141 mmol/L (ref 135–145)
Sodium: 141 mmol/L (ref 135–145)
TCO2: 27 mmol/L (ref 22–32)
TCO2: 27 mmol/L (ref 22–32)
pCO2, Ven: 40.6 mm[Hg] — ABNORMAL LOW (ref 44–60)
pCO2, Ven: 40.6 mm[Hg] — ABNORMAL LOW (ref 44–60)
pH, Ven: 7.413 (ref 7.25–7.43)
pH, Ven: 7.418 (ref 7.25–7.43)
pO2, Ven: 35 mm[Hg] (ref 32–45)
pO2, Ven: 36 mm[Hg] (ref 32–45)

## 2023-02-01 SURGERY — RIGHT HEART CATH
Anesthesia: LOCAL

## 2023-02-01 MED ORDER — HEPARIN (PORCINE) IN NACL 1000-0.9 UT/500ML-% IV SOLN
INTRAVENOUS | Status: DC | PRN
  Administered 2023-02-01: 500 mL

## 2023-02-01 MED ORDER — LIDOCAINE HCL (PF) 1 % IJ SOLN
INTRAMUSCULAR | Status: DC | PRN
  Administered 2023-02-01: 5 mL

## 2023-02-01 MED ORDER — HYDRALAZINE HCL 20 MG/ML IJ SOLN
INTRAMUSCULAR | Status: AC
  Filled 2023-02-01: qty 1

## 2023-02-01 MED ORDER — HYDRALAZINE HCL 20 MG/ML IJ SOLN
INTRAMUSCULAR | Status: DC | PRN
  Administered 2023-02-01 (×2): 10 mg via INTRAVENOUS

## 2023-02-01 MED ORDER — LIDOCAINE HCL (PF) 1 % IJ SOLN
INTRAMUSCULAR | Status: AC
Start: 2023-02-01 — End: ?
  Filled 2023-02-01: qty 30

## 2023-02-01 MED ORDER — SODIUM CHLORIDE 0.9 % IV SOLN: INTRAVENOUS | Status: DC

## 2023-02-01 MED ORDER — AMLODIPINE BESYLATE 5 MG PO TABS: 5.0000 mg | ORAL_TABLET | Freq: Every day | ORAL | 11 refills | Status: DC

## 2023-02-01 SURGICAL SUPPLY — 10 items
CATH BALLN WEDGE 5F 110CM (CATHETERS) IMPLANT
CATH SWAN GANZ 7F STRAIGHT (CATHETERS) IMPLANT
GLIDESHEATH SLENDER 7FR .021G (SHEATH) IMPLANT
PACK CARDIAC CATHETERIZATION (CUSTOM PROCEDURE TRAY) ×2 IMPLANT
SHEATH GLIDE SLENDER 4/5FR (SHEATH) IMPLANT
SHEATH PROBE COVER 6X72 (BAG) IMPLANT
TRANSDUCER W/STOPCOCK (MISCELLANEOUS) IMPLANT
TUBING ART PRESS 72 MALE/FEM (TUBING) IMPLANT
WIRE HI TORQ VERSACORE-J 145CM (WIRE) IMPLANT
WIRE MICROINTRODUCER 60CM (WIRE) IMPLANT

## 2023-02-01 NOTE — H&P (Signed)
ADVANCED HEART FAILURE BRIEF PRE-PROCEDURE H&P  Referring Physician: No ref. provider found  Primary Care: Lucky Cowboy, MD Primary Cardiologist: Charlton Haws, MD  HPI: Claudia [name] is a 72 y.o. female with hypertension, paroxysmal atrial fibrillation, type 2 diabetes, obstructive sleep apnea presenting today for elective RHC for pulmonary hypertension evaluation due to worsening dyspnea and TTE w/ RVSP of .   Doing well today; understands risks/benefits. No recent illness.   Past Medical History:  Diagnosis Date   Achilles tendonitis    R heal, limits activity   Allergy    Anxiety    Elevated hemoglobin A1c    Hyperlipidemia    Hypertension    IBS (irritable bowel syndrome)    Insomnia    Obesity    Palpitations    SVT (supraventricular tachycardia) (HCC) 10/29/2013   Vitamin D deficiency     Current Facility-Administered Medications  Medication Dose Route Frequency Provider Last Rate Last Admin   0.9 %  sodium chloride infusion   Intravenous Continuous Lora Chavers, DO        Allergies  Allergen Reactions   Ciprofloxacin Other (See Comments)    "Felt worse"   Levaquin [Levofloxacin In D5w]     No fluroquinolones due to ascending thoracic aortic aneurysm   Penicillins Other (See Comments)    Had as a child=unknown reaction   Zoloft [Sertraline Hcl] Other (See Comments)    "Felt bad"      Social History   Socioeconomic History   Marital status: Married    Spouse name: Not on file   Number of children: Not on file   Years of education: Not on file   Highest education level: Not on file  Occupational History   Not on file  Tobacco Use   Smoking status: Former    Current packs/day: 0.00    Types: Cigarettes    Quit date: 04/01/2004    Years since quitting: 18.8   Smokeless tobacco: Never   Tobacco comments:    Social smoker times 10 years  Vaping Use   Vaping status: Never Used  Substance and Sexual Activity   Alcohol use: Yes     Alcohol/week: 0.0 standard drinks of alcohol    Comment: rarely, < 1 beer per week   Drug use: No   Sexual activity: Not on file  Other Topics Concern   Not on file  Social History Narrative   Pt lives in [place] with spouse.  Retired from [employer].   Social Determinants of Health   Financial Resource Strain: Not on file  Food Insecurity: Not on file  Transportation Needs: Not on file  Physical Activity: Not on file  Stress: Not on file  Social Connections: Not on file  Intimate Partner Violence: Not on file      Family History  Problem Relation Age of Onset   Heart attack Mother    Stroke Mother    Heart disease Mother    Diabetes Mother    Macular degeneration Mother    Heart attack Father    Alcohol abuse Father    Ovarian cancer Maternal Grandmother    Colon cancer Neg Hx     PHYSICAL EXAM: Vitals:   02/01/23 0903 02/01/23 0924  BP: (!) 191/88 (!) 170/75  Pulse: 64   Resp: 19   Temp: 97.7 F (36.5 C)   SpO2: 99%    GENERAL: Well nourished, well developed, and in no apparent distress at rest.  HEENT: Negative  for arcus senilis or xanthelasma. There is no scleral icterus.  The mucous membranes are pink and moist.   NECK: Supple, No masses. Normal carotid upstrokes without bruits. No masses or thyromegaly.    CHEST: There are no chest wall deformities. There is no chest wall tenderness. Respirations are unlabored.  Lungs- coarse lung sounds CARDIAC:           Normal rate with regular rhythm. No murmurs, rubs or gallops.  Pulses are 2+ and symmetrical in upper and lower extremities. no edema.  ABDOMEN: Soft, non-tender, non-distended. There are no masses or hepatomegaly. There are normal bowel sounds.  EXTREMITIES: Warm and well perfused with no cyanosis, clubbing.  LYMPHATIC: No axillary or supraclavicular lymphadenopathy.  NEUROLOGIC: Patient is oriented x3 with no focal or lateralizing neurologic deficits.  PSYCH: Patients affect is  appropriate, there is no evidence of anxiety or depression.  SKIN: Warm and dry; no lesions or wounds.    DATA REVIEW  ECG: NSR  As per my personal interpretation  ECHO: 12/24/22: LVEF 75-70%, normal RV function with PASP of As per my personal interpretation   ASSESSMENT & PLAN:  Evaluation for pulmonary hypertension -Proceed with RHC.   Tyaira Heward Advanced Heart Failure Mechanical Circulatory Support

## 2023-02-01 NOTE — Discharge Instructions (Signed)

## 2023-02-02 ENCOUNTER — Other Ambulatory Visit: Payer: Self-pay

## 2023-02-07 DIAGNOSIS — M79671 Pain in right foot: Secondary | ICD-10-CM | POA: Diagnosis not present

## 2023-02-07 DIAGNOSIS — M170 Bilateral primary osteoarthritis of knee: Secondary | ICD-10-CM | POA: Diagnosis not present

## 2023-02-09 ENCOUNTER — Other Ambulatory Visit: Payer: Self-pay

## 2023-02-09 MED ORDER — AMLODIPINE BESYLATE 5 MG PO TABS: 5.0000 mg | ORAL_TABLET | Freq: Every day | ORAL | 3 refills | Status: DC

## 2023-02-14 ENCOUNTER — Ambulatory Visit: Payer: TRICARE For Life (TFL) | Admitting: Internal Medicine

## 2023-02-15 ENCOUNTER — Telehealth (HOSPITAL_COMMUNITY): Payer: Self-pay

## 2023-02-15 NOTE — Telephone Encounter (Signed)
Attempted to call patient to see if she is willing to do home sleep test (itamar)   Per pre cert- none is required patient can proceed.   Left message to call back to discuss getting device/proceeding.

## 2023-02-16 ENCOUNTER — Other Ambulatory Visit: Payer: Self-pay | Admitting: Internal Medicine

## 2023-02-16 MED ORDER — SEMAGLUTIDE-WEIGHT MANAGEMENT 1 MG/0.5ML ~~LOC~~ SOAJ: SUBCUTANEOUS | 0 refills | Status: DC

## 2023-02-18 ENCOUNTER — Other Ambulatory Visit: Payer: Self-pay

## 2023-03-01 ENCOUNTER — Other Ambulatory Visit (HOSPITAL_COMMUNITY): Payer: Self-pay

## 2023-03-02 ENCOUNTER — Other Ambulatory Visit: Payer: Self-pay

## 2023-03-02 ENCOUNTER — Other Ambulatory Visit (HOSPITAL_COMMUNITY): Payer: Self-pay

## 2023-03-02 MED ORDER — SEMAGLUTIDE-WEIGHT MANAGEMENT 1 MG/0.5ML ~~LOC~~ SOAJ
SUBCUTANEOUS | 0 refills | Status: DC
  Filled 2023-03-02: qty 2, 28d supply, fill #0
  Filled 2023-03-28: qty 2, 28d supply, fill #1

## 2023-03-04 ENCOUNTER — Other Ambulatory Visit (HOSPITAL_COMMUNITY): Payer: Self-pay

## 2023-03-07 DIAGNOSIS — M1712 Unilateral primary osteoarthritis, left knee: Secondary | ICD-10-CM | POA: Diagnosis not present

## 2023-03-09 ENCOUNTER — Encounter: Payer: Self-pay | Admitting: Physician Assistant

## 2023-03-09 ENCOUNTER — Ambulatory Visit: Payer: Medicare Other | Attending: Physician Assistant | Admitting: Physician Assistant

## 2023-03-09 VITALS — BP 160/90 | HR 64 | Ht 68.0 in | Wt 235.4 lb

## 2023-03-09 DIAGNOSIS — I480 Paroxysmal atrial fibrillation: Secondary | ICD-10-CM | POA: Diagnosis not present

## 2023-03-09 DIAGNOSIS — I5189 Other ill-defined heart diseases: Secondary | ICD-10-CM | POA: Diagnosis not present

## 2023-03-09 DIAGNOSIS — M1711 Unilateral primary osteoarthritis, right knee: Secondary | ICD-10-CM | POA: Diagnosis not present

## 2023-03-09 DIAGNOSIS — I2721 Secondary pulmonary arterial hypertension: Secondary | ICD-10-CM | POA: Diagnosis not present

## 2023-03-09 DIAGNOSIS — I471 Supraventricular tachycardia, unspecified: Secondary | ICD-10-CM | POA: Insufficient documentation

## 2023-03-09 DIAGNOSIS — Z01810 Encounter for preprocedural cardiovascular examination: Secondary | ICD-10-CM | POA: Insufficient documentation

## 2023-03-09 DIAGNOSIS — I7121 Aneurysm of the ascending aorta, without rupture: Secondary | ICD-10-CM | POA: Insufficient documentation

## 2023-03-09 DIAGNOSIS — I2510 Atherosclerotic heart disease of native coronary artery without angina pectoris: Secondary | ICD-10-CM | POA: Diagnosis not present

## 2023-03-09 DIAGNOSIS — I10 Essential (primary) hypertension: Secondary | ICD-10-CM | POA: Insufficient documentation

## 2023-03-09 MED ORDER — HYDRALAZINE HCL 25 MG PO TABS: 25.0000 mg | ORAL_TABLET | Freq: Three times a day (TID) | ORAL | 3 refills | Status: DC

## 2023-03-09 NOTE — Assessment & Plan Note (Signed)
I will review further with Dr. Gasper Lloyd to see if cardiac MRI needs to be done prior to undergoing foot surgery.

## 2023-03-09 NOTE — Assessment & Plan Note (Signed)
41 mm on recent CT.  Continue annual surveillance.

## 2023-03-09 NOTE — Assessment & Plan Note (Signed)
She manages this well with as needed beta-blocker.

## 2023-03-09 NOTE — Assessment & Plan Note (Signed)
Calcium score 94.4 on CT in February 2024.  Nuclear stress test July 2024 without ischemia.  She is not having symptoms to suggest angina.  Continue pravastatin 20 mg daily.

## 2023-03-09 NOTE — Assessment & Plan Note (Signed)
She could not tolerate amlodipine due to orthopnea.  Blood pressure remains uncontrolled.  Continue HCTZ 25 mg daily, metoprolol succinate 12.5 mg daily, olmesartan 40 mg daily.  Start hydralazine 25 mg 3 times daily.

## 2023-03-09 NOTE — Progress Notes (Addendum)
Cardiology Office Note:    Date:  03/09/2023  ID:  Claudia [name], DOB [birth year], MRN [id number] PCP: Claudia Cowboy, MD  [facility] Providers Cardiologist:  Claudia Haws, MD       Patient Profile:      Paroxysmal atrial fibrillation Coronary artery calcification CAC score 06/02/2022: 94.4 (74th percentile) Myoview 10/26/2022: No ischemia or infarction, EF 66, low risk Supraventricular Tachycardia  Monitor 10/26/2022 NSR, 17 SVT runs (longest 20 beats), some SVT runs may be atrial tachycardia with variable block, rare PVCs/PACs Pulmonary hypertension TTE 08/08/2013: PASP 35 TTE 01/29/2022: EF 60-65, normal RVSF, mild pulmonary hypertension, RVSP 41.6 TTE 12/23/2022: EF 65-70, no RWMA, GR 2 DD, normal RVSF, mild RVE, severe pulmonary hypertension, RVSP 68.9, moderate LAE, mobile density right side of interatrial septum (2.8 x 1.6 cm), mild MR, mild-moderate TR, trivial AI, AV sclerosis, mild dilation of ascending aorta (40 mm), RAP 15 RHC 02/01/2023: Mean PA 36, PVR 2.6 Woods units -large V waves on PCWP tracing likely secondary to stiff left atrial syndrome and moderate MR in setting of uncontrolled hypertension, mild to moderately elevated PA mean and PVR 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  Claudia [name] is a 72 y.o. female who returns for surgical clearance.    She was seen by Claudia [name] in July 2024 for evaluation of shortness of breath.  NT proBNP was normal at 261.  Nuclear stress test demonstrated no ischemia.  Cardiac monitor demonstrated no atrial fibrillation.  There  was several runs of brief SVT.  CT was obtained in July 2024 to follow-up on thoracic aortic aneurysm.  This demonstrated increased markings consistent with pulmonary hypertension.    She was referred to the advanced heart failure clinic.  Echocardiogram demonstrated EF 65-70, RVSP 68.9.  There was a mobile density on the right side of the interatrial septum measuring 2.8 x 1.6 cm.  Follow-up cardiac MRI was recommended.  She underwent right heart cath with Claudia [name] in October.  This demonstrated mean PA pressure 36.  There were large V waves on PCWP tracing likely secondary to stiff left atrial syndrome and moderate mitral regurgitation in the setting of uncontrolled hypertension.  There was mild to moderate elevated PA mean and PVR consistent with post and precapillary pulmonary hypertension and hemodynamics were consistent with WHO group 2 pulmonary hypertension.  She was started on amlodipine 5 mg daily.  She has follow-up with the advanced heart failure clinic again next week.   She is here alone. She needs surgical clearance for heel spur surgery with Dr. Jerl [name] in Jan 2025. The patient reports no shortness of breath, chest pain, or pressure. The patient is able to perform daily activities, including climbing stairs, without significant dyspnea. The patient denies any sudden awakenings due to breathlessness at night, attributing any potential sleep disturbances to possible sleep apnea. The patient has recently started on Wegovy for weight loss, hoping this will help manage her sleep apnea symptoms and hypertension. The patient reports side effects to amlodipine, which caused significant shortness of breath when lying down. This symptom resolved upon discontinuation of the medication.  The patient denies any current symptoms of bloody stools, blood in urine, or coughing, and confirms she is a non-smoker.      ROS   See HPI    Studies Reviewed:               Risk Assessment/Calculations:      HYPERTENSION CONTROL Vitals:   03/09/23 1346 03/09/23 1432  BP: (!) 158/80 (!) 160/90    The patient's blood pressure is elevated above target today.  In order to address the patient's elevated BP: A new medication was prescribed today.          Physical Exam:   VS:  BP (!) 160/90   Pulse 64   Ht 5\' 8"  (1.727 m)   Wt 235 lb 6.4 oz (106.8 kg)   SpO2 96%   BMI 35.79 kg/m    Wt Readings from Last 3 Encounters:  03/09/23 235 lb 6.4 oz (106.8 kg)  02/01/23 245 lb (111.1 kg)  01/20/23 242 lb 12.8 oz (110.1 kg)    Constitutional:      Appearance: Healthy appearance. Not in distress.  Neck:     Vascular: JVD normal.  Pulmonary:     Breath sounds: Normal breath sounds. No wheezing. No rales.  Cardiovascular:     Normal rate. Regular rhythm.     Murmurs: There is no murmur.  Edema:    Peripheral edema absent.  Abdominal:     Palpations: Abdomen is soft.        Assessment and Plan:   Assessment & Plan Preoperative cardiovascular examination [name]'s perioperative risk of a major cardiac event is 0.4% according to the Revised Cardiac Risk Index (RCRI).  Therefore, she is at low risk for perioperative complications.   Her functional capacity is good at 4.31 METs according to the Duke Activity Status Index (DASI). Recommendations: I will review further with Claudia [name] with the CHF clinic to see if we need to do any further testing for her pulmonary hypertension prior to surgery. Antiplatelet and/or Anticoagulation Recommendations: Eliquis (Apixaban) can be held for 2 days prior to surgery.  Please resume post op when felt to be safe.    Addendum 03/11/23: I reviewed with Claudia [name]. From a pulmonary hypertension standpoint, she can proceed with her surgery. Therefore, she may proceed at acceptable risk.  PAH (pulmonary artery hypertension) (HCC) Ongoing evaluation with advanced heart failure clinic.  Most recent right heart catheterization demonstrated findings  consistent with post and precapillary pulmonary hypertension, group 2 pulmonary hypertension. Paroxysmal atrial fibrillation (HCC) Maintaining sinus rhythm.  She is tolerating anticoagulation well.  Labs reviewed via chart review.  02/01/2023: Hemoglobin 12.2, 01/20/2023: Creatinine 0.81.  Continue Eliquis 5 mg twice daily, metoprolol succinate 12.5 mg daily. Coronary artery calcification Calcium score 94.4 on CT in February 2024.  Nuclear stress test July 2024 without ischemia.  She is not having symptoms to suggest angina.  Continue pravastatin 20 mg daily. SVT (supraventricular tachycardia) (HCC) She manages this well with as needed beta-blocker. Right atrial mass I will review further with Claudia [name] to see if cardiac MRI needs to be done prior to undergoing foot surgery. Aneurysm of ascending aorta without rupture (HCC) 41 mm on recent CT.  Continue annual surveillance. Essential hypertension She could not tolerate amlodipine due to orthopnea.  Blood pressure remains uncontrolled.  Continue HCTZ 25 mg daily, metoprolol succinate 12.5 mg daily, olmesartan 40 mg daily.  Start hydralazine 25 mg 3 times daily.  Dispo:  Return in about 6 months (around 09/07/2023) for Routine Follow Up, w/ Dr. Eden Emms.  Signed, Tereso Newcomer, PA-C

## 2023-03-09 NOTE — Assessment & Plan Note (Signed)
Maintaining sinus rhythm.  She is tolerating anticoagulation well.  Labs reviewed via chart review.  02/01/2023: Hemoglobin 12.2, 01/20/2023: Creatinine 0.81.  Continue Eliquis 5 mg twice daily, metoprolol succinate 12.5 mg daily.

## 2023-03-09 NOTE — Patient Instructions (Signed)
Medication Instructions:  Your physician has recommended you make the following change in your medication:   START Hydralazine 25 taking 1 three times a day  *If you need a refill on your cardiac medications before your next appointment, please call your pharmacy*   Lab Work: None ordered  If you have labs (blood work) drawn today and your tests are completely normal, you will receive your results only by: MyChart Message (if you have MyChart) OR A paper copy in the mail If you have any lab test that is abnormal or we need to change your treatment, we will call you to review the results.   Testing/Procedures: None ordered   Follow-Up: At [facility], you and your health needs are our priority.  As part of our continuing mission to provide you with exceptional heart care, we have created designated Provider Care Teams.  These Care Teams include your primary Cardiologist (physician) and Advanced Practice Providers (APPs -  Physician Assistants and Nurse Practitioners) who all work together to provide you with the care you need, when you need it.  We recommend signing up for the patient portal called "MyChart".  Sign up information is provided on this After Visit Summary.  MyChart is used to connect with patients for Virtual Visits (Telemedicine).  Patients are able to view lab/test results, encounter notes, upcoming appointments, etc.  Non-urgent messages can be sent to your provider as well.   To learn more about what you can do with MyChart, go to [email or web address].    Your next appointment:   6 month(s)  Provider:   Charlton Haws, MD     Other Instructions

## 2023-03-14 DIAGNOSIS — M1712 Unilateral primary osteoarthritis, left knee: Secondary | ICD-10-CM | POA: Diagnosis not present

## 2023-03-15 NOTE — Progress Notes (Signed)
ADVANCED HEART FAILURE BRIEF PRE-PROCEDURE H&P  Referring Physician: Lucky Cowboy, MD  Primary Care: Lucky Cowboy, MD Primary Cardiologist: Charlton Haws, MD  HPI: Claudia [name] is a 72 y.o. female with hypertension, paroxysmal atrial fibrillation, type 2 diabetes, obstructive sleep apnea presenting today to establish care and for evaluation of pulmonary hypertension.  Her cardiac history dates back to April 2015 when she presented with complaints of palpitations.  Echocardiogram at that time with a EF of 60 to 65%.  She was diagnosed with paroxysmal atrial fibrillation and started on Eliquis.  She reports doing fairly well until she gained a lot of weight over the summer due to poor nutrition.  She reports that during this time she started to have shortness of breath and lower extremity edema.  She had an echocardiogram with RVSP approaching 70 mmHg.  She presents today for evaluation of pulmonary hypertension.   Interval hx:  -From a symptomatic standpoint she has been doing fairly well.  Reports only minimal shortness of breath with exertion.  No lightheadedness, lower extremity edema, PND.   Activity level/exercise tolerance: NYHA IIb Orthopnea:  Sleeps on 2 pillows Paroxysmal noctural dyspnea: No Chest pain/pressure:  no Orthostatic lightheadedness:  no Palpitations:  no Lower extremity edema:  no Presyncope/syncope:  no Cough:  no  Past Medical History:  Diagnosis Date   Achilles tendonitis    R heal, limits activity   Allergy    Anxiety    Elevated hemoglobin A1c    Hyperlipidemia    Hypertension    IBS (irritable bowel syndrome)    Insomnia    Obesity    Palpitations    SVT (supraventricular tachycardia) (HCC) 10/29/2013   Vitamin D deficiency     Current Outpatient Medications  Medication Sig Dispense Refill   Ascorbic Acid (VITAMIN C PO) Take 500 mg by mouth 2 (two) times daily.     Cholecalciferol (VITAMIN D-3 PO) Take 4,000 Units by mouth in the  morning and at bedtime.     CINNAMON PO Take 1,000 mg by mouth at bedtime.     Cyanocobalamin (VITAMIN B-12 PO) Take 500 mcg by mouth in the morning.     diclofenac Sodium (VOLTAREN) 1 % GEL Apply 4 g topically 4 (four) times daily as needed.     diphenhydrAMINE (BENADRYL) 25 mg capsule Take 25 mg by mouth at bedtime.     ELIQUIS 5 MG TABS tablet TAKE 1 TABLET TWICE A DAY 180 tablet 3   Glucosamine 750 MG TABS Take 750 mg by mouth in the morning and at bedtime.     HOMEOPATHIC PRODUCTS EX Place 1 patch onto the skin daily. Natural Knee Pain Patch-Herbal Knee Patches (Peppermint/Capsicum/Camphor/Ginger/Safflower/[place] extracts) Applied knee/heel.     lidocaine (LIDODERM) 5 % Place 1 patch onto the skin daily as needed (pain.).     loperamide (IMODIUM A-D) 2 MG tablet Take 6 mg by mouth in the morning.     Menaquinone-7 (VITAMIN K2 PO) Take 100 mcg by mouth in the morning.     metoprolol succinate (TOPROL-XL) 25 MG 24 hr tablet TAKE ONE-HALF (1/2) TABLET DAILY 45 tablet 3   Omega-3 Fatty Acids (FISH OIL) 1000 MG CAPS Take 1,000 mg by mouth at bedtime.     oxybutynin (DITROPAN XL) 10 MG 24 hr tablet Take  1 tablet  at Suppertime  for OverActive Bladder 90 tablet 3   pantoprazole (PROTONIX) 40 MG tablet Take  1 tablet  Daily  to Prevent Heartburn &  Indigestion 90 tablet 3   pravastatin (PRAVACHOL) 20 MG tablet Take 1 tablet (20 mg total) by mouth daily. 30 tablet 0   Semaglutide-Weight Management 0.5 MG/0.5ML SOAJ Inject  1 pen  (0.5 mg)  into Skin every 7 days for Weight Management 2 mL 0   Simethicone (GAS-X MAXIMUM STRENGTH PO) Take 1 tablet by mouth at bedtime.     valsartan-hydrochlorothiazide (DIOVAN HCT) 320-25 MG tablet Take 1 tablet by mouth daily. 90 tablet 3   ZINC GLUCONATE PO Take 25 mg by mouth at bedtime.     Semaglutide-Weight Management 1 MG/0.5ML SOAJ Inject 1 pen  (1 mg) into Skin   every 7 to 10  days  for Weight Management (Patient not taking: Reported on 03/16/2023) 6 mL 0    No current facility-administered medications for this encounter.    Allergies  Allergen Reactions   Amlodipine Shortness Of Breath   Ciprofloxacin Other (See Comments)    "Felt worse"   Levaquin [Levofloxacin In D5w]     No fluroquinolones due to ascending thoracic aortic aneurysm   Penicillins Other (See Comments)    Had as a child=unknown reaction   Zoloft [Sertraline Hcl] Other (See Comments)    "Felt bad"      Social History   Socioeconomic History   Marital status: Married    Spouse name: Not on file   Number of children: Not on file   Years of education: Not on file   Highest education level: Not on file  Occupational History   Not on file  Tobacco Use   Smoking status: Former    Current packs/day: 0.00    Types: Cigarettes    Quit date: 04/01/2004    Years since quitting: 18.9   Smokeless tobacco: Never   Tobacco comments:    Social smoker times 10 years  Vaping Use   Vaping status: Never Used  Substance and Sexual Activity   Alcohol use: Yes    Alcohol/week: 0.0 standard drinks of alcohol    Comment: rarely, < 1 beer per week   Drug use: No   Sexual activity: Not on file  Other Topics Concern   Not on file  Social History Narrative   Pt lives in [place] with spouse.  Retired from [employer].   Social Determinants of Health   Financial Resource Strain: Not on file  Food Insecurity: Not on file  Transportation Needs: Not on file  Physical Activity: Not on file  Stress: Not on file  Social Connections: Not on file  Intimate Partner Violence: Not on file      Family History  Problem Relation Age of Onset   Heart attack Mother    Stroke Mother    Heart disease Mother    Diabetes Mother    Macular degeneration Mother    Heart attack Father    Alcohol abuse Father    Ovarian cancer Maternal Grandmother    Colon cancer Neg Hx     PHYSICAL EXAM: Vitals:   03/16/23 1053  BP: 120/60  Pulse: (!) 57  SpO2: 98%   GENERAL: Well  nourished, well developed, and in no apparent distress at rest.  HEENT: Negative for arcus senilis or xanthelasma. There is no scleral icterus.  The mucous membranes are pink and moist.   NECK: Supple, No masses. Normal carotid upstrokes without bruits. No masses or thyromegaly.    CHEST: There are no chest wall deformities. There is no chest wall tenderness. Respirations are unlabored.  Lungs- CTA B/L CARDIAC:  JVP: 7 cm          Normal rate with regular rhythm. No murmurs, rubs or gallops.  Pulses are 2+ and symmetrical in upper and lower extremities. No edema.  ABDOMEN: Soft, non-tender, non-distended. There are no masses or hepatomegaly. There are normal bowel sounds.  EXTREMITIES: Warm and well perfused with no cyanosis, clubbing.  LYMPHATIC: No axillary or supraclavicular lymphadenopathy.  NEUROLOGIC: Patient is oriented x3 with no focal or lateralizing neurologic deficits.  PSYCH: Patients affect is appropriate, there is no evidence of anxiety or depression.  SKIN: Warm and dry; no lesions or wounds.   DATA REVIEW  ECG: NSR  As per my personal interpretation  ECHO: 12/24/22: LVEF 75-70%, normal RV function with PASP of As per my personal interpretation  RHC 02/01/23:  HEMODYNAMICS: RA:                  3 mmHg (mean) RV:                  60/3 mmHg L-PA:               58/20 mmHg (36 mean) L-PCWP:         Mean 20 mmHg with V waves to 43 R-PCWP:        Mean with V waves to NIBP: 180s/90s                                      Estimated Fick CO/CI   6.2 L/min, 2.8 L/min/m2                                      TPG                 16  mmHg                                            PVR                 2.6 Wood Units  PAPi                12   After IV hydralazine 20mg : NIBP: 140s/70s  PA: 49/15 PCWP: mean 14 with V waves to 30    ASSESSMENT & PLAN:  HFpEF/Group II PH -Reports progression of shortness of breath over the summer when she gained significant  weight. -Echocardiogram today (12/23/2022) with RVSP approaching 70 mmHg.  Left atrium is also mild to moderately elevated.  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 -RHC on 02/01/23 with low right sided filling pressures; PCWP of 20 with large V waves likely secondary to stiff left atrium syndrome fro prolonged history of OSA and untreated HTN. She only has mild MR by TTE in 12/23/22. Will repeat TTE for repeat assessment. Underlying afib is also likely contributing to elevated LA pressures and group II PH.    2.  Paroxysmal atrial fibrillation -Continue metoprolol 25 mg daily; if SBP remains elevated will switch to coreg 3.125mg  BID -Continue apixaban 5 mg twice daily. -Continue follow-up with cardiology.   3.  Hypertension - D/C olmesartan; start valsartan/hydrochlorothiazide combo pill today.  - Repeat BMP/BNP - Will hold off addition of hydralazine   4.  Hyperlipidemia -Continue pravastatin 20mg  daily   5. Right atrial echodensity - Reported on TTE from 12/23/22 - Will order CMR today; discussed various possibilities of RA mass with her today.    6. Obesity - Body mass index is 35.25 kg/m. - She has now been on [facility] for 1 month; increased dose to 1mg  this past Friday.  7. Obstructive Sleep apnea  - Very likely has sleep apnea contributing to stiff LA syndrome. She is now taking Wegovy. Wishes to hold off until 5/25 for [place].   I spent 38 minutes caring for this patient today including face to face time, ordering and reviewing labs, reviewing records from  [facility] (03/09/23), discussing RA mass on imaging and reviewing echocardiogram, seeing the patient, documenting in the record, and arranging follow ups.   Darien Mignogna Advanced Heart Failure Mechanical Circulatory Support

## 2023-03-16 ENCOUNTER — Encounter (HOSPITAL_COMMUNITY): Payer: Self-pay | Admitting: Cardiology

## 2023-03-16 ENCOUNTER — Ambulatory Visit (HOSPITAL_COMMUNITY)
Admission: RE | Admit: 2023-03-16 | Discharge: 2023-03-16 | Disposition: A | Discharge status: Home / Self Care | Payer: Medicare Other | Source: Ambulatory Visit | Attending: Cardiology | Admitting: Cardiology

## 2023-03-16 VITALS — BP 120/60 | HR 57 | Wt 231.8 lb

## 2023-03-16 DIAGNOSIS — Z6835 Body mass index (BMI) 35.0-35.9, adult: Secondary | ICD-10-CM | POA: Diagnosis not present

## 2023-03-16 DIAGNOSIS — I2510 Atherosclerotic heart disease of native coronary artery without angina pectoris: Secondary | ICD-10-CM | POA: Diagnosis not present

## 2023-03-16 DIAGNOSIS — E669 Obesity, unspecified: Secondary | ICD-10-CM | POA: Insufficient documentation

## 2023-03-16 DIAGNOSIS — I2721 Secondary pulmonary arterial hypertension: Secondary | ICD-10-CM

## 2023-03-16 DIAGNOSIS — R931 Abnormal findings on diagnostic imaging of heart and coronary circulation: Secondary | ICD-10-CM | POA: Diagnosis not present

## 2023-03-16 DIAGNOSIS — I480 Paroxysmal atrial fibrillation: Secondary | ICD-10-CM

## 2023-03-16 DIAGNOSIS — I700 Atherosclerosis of aorta: Secondary | ICD-10-CM

## 2023-03-16 DIAGNOSIS — Z7901 Long term (current) use of anticoagulants: Secondary | ICD-10-CM | POA: Diagnosis not present

## 2023-03-16 DIAGNOSIS — M1711 Unilateral primary osteoarthritis, right knee: Secondary | ICD-10-CM | POA: Diagnosis not present

## 2023-03-16 DIAGNOSIS — I5189 Other ill-defined heart diseases: Secondary | ICD-10-CM

## 2023-03-16 DIAGNOSIS — E119 Type 2 diabetes mellitus without complications: Secondary | ICD-10-CM | POA: Insufficient documentation

## 2023-03-16 DIAGNOSIS — E785 Hyperlipidemia, unspecified: Secondary | ICD-10-CM | POA: Diagnosis not present

## 2023-03-16 DIAGNOSIS — I110 Hypertensive heart disease with heart failure: Secondary | ICD-10-CM | POA: Diagnosis not present

## 2023-03-16 DIAGNOSIS — Z79899 Other long term (current) drug therapy: Secondary | ICD-10-CM | POA: Insufficient documentation

## 2023-03-16 DIAGNOSIS — I5032 Chronic diastolic (congestive) heart failure: Secondary | ICD-10-CM | POA: Diagnosis not present

## 2023-03-16 DIAGNOSIS — G4733 Obstructive sleep apnea (adult) (pediatric): Secondary | ICD-10-CM | POA: Insufficient documentation

## 2023-03-16 DIAGNOSIS — I2489 Other forms of acute ischemic heart disease: Secondary | ICD-10-CM

## 2023-03-16 LAB — CBC
HCT: 39.5 % (ref 36.0–46.0)
Hemoglobin: 12.7 g/dL (ref 12.0–15.0)
MCH: 28 pg (ref 26.0–34.0)
MCHC: 32.2 g/dL (ref 30.0–36.0)
MCV: 87.2 fL (ref 80.0–100.0)
Platelets: 290 10*3/uL (ref 150–400)
RBC: 4.53 MIL/uL (ref 3.87–5.11)
RDW: 13.4 % (ref 11.5–15.5)
WBC: 6.7 10*3/uL (ref 4.0–10.5)
nRBC: 0 % (ref 0.0–0.2)

## 2023-03-16 LAB — BRAIN NATRIURETIC PEPTIDE: B Natriuretic Peptide: 85.1 pg/mL (ref 0.0–100.0)

## 2023-03-16 LAB — BASIC METABOLIC PANEL
Anion gap: 8 (ref 5–15)
BUN: 10 mg/dL (ref 8–23)
CO2: 25 mmol/L (ref 22–32)
Calcium: 9.7 mg/dL (ref 8.9–10.3)
Chloride: 104 mmol/L (ref 98–111)
Creatinine, Ser: 0.71 mg/dL (ref 0.44–1.00)
GFR, Estimated: >60 mL/min (ref >60)
Glucose, Bld: 109 mg/dL — ABNORMAL HIGH (ref 70–99)
Potassium: 4.3 mmol/L (ref 3.5–5.1)
Sodium: 137 mmol/L (ref 135–145)

## 2023-03-16 MED ORDER — VALSARTAN-HYDROCHLOROTHIAZIDE 320-25 MG PO TABS: 1.0000 | ORAL_TABLET | Freq: Every day | ORAL | 3 refills | Status: DC

## 2023-03-16 NOTE — Patient Instructions (Addendum)
STOP Hydralazine  STOP Olmersartan  START Diovan HCT ( 1 Tab) daily.  Labs done today, your results will be available in MyChart, we will contact you for abnormal readings.  Your physician has requested that you have a cardiac MRI. Cardiac MRI uses a computer to create images of your heart as its beating, producing both still and moving pictures of your heart and major blood vessels. For further information please visit [email or web address]. Please follow the instruction sheet given to you today for more information. YOU WILL BE CALLED TO HAVE THIS TEST ARRANGED.  Your physician recommends that you schedule a follow-up appointment in: 4 months ( April 2025). ** PLEASE CALL THE OFFICE IN FEBRUARY TO ARRANGE YOUR FOLLOW UP APPOINTMENT. **  If you have any questions or concerns before your next appointment please send [place] a message through [place] or call our office at [phone].    TO LEAVE A MESSAGE FOR THE NURSE SELECT OPTION 2, PLEASE LEAVE A MESSAGE INCLUDING: YOUR NAME DATE OF BIRTH CALL BACK NUMBER REASON FOR CALL**this is important as we prioritize the call backs  YOU WILL RECEIVE A CALL BACK THE SAME DAY AS LONG AS YOU CALL BEFORE 4:00 PM  At the [facility] Failure Clinic, you and your health needs are our priority. As part of our continuing mission to provide you with exceptional heart care, we have created designated Provider Care Teams. These Care Teams include your primary Cardiologist (physician) and Advanced Practice Providers (APPs- Physician Assistants and Nurse Practitioners) who all work together to provide you with the care you need, when you need it.   You may see any of the following providers on your designated Care Team at your next follow up: Dr Arvilla Meres Dr Marca Ancona Dr. Dorthula Nettles Dr. Clearnce Hasten Amy Filbert Schilder, NP Robbie Lis, [place] [facility] Reynolds, [place] Brynda Peon, NP [place] Lee, NP Karle Plumber, PharmD   Please be  sure to bring in all your medications bottles to every appointment.    Thank you for choosing [facility]

## 2023-03-18 ENCOUNTER — Other Ambulatory Visit: Payer: Self-pay

## 2023-03-18 ENCOUNTER — Telehealth: Payer: Self-pay | Admitting: Pharmacist

## 2023-03-18 DIAGNOSIS — K219 Gastro-esophageal reflux disease without esophagitis: Secondary | ICD-10-CM

## 2023-03-18 MED ORDER — PANTOPRAZOLE SODIUM 40 MG PO TBEC: DELAYED_RELEASE_TABLET | ORAL | 3 refills | Status: DC

## 2023-03-18 NOTE — Telephone Encounter (Signed)
Patient with diagnosis of A Fib on Eliquis for anticoagulation.    Procedure: heel spur surgery  Date of procedure: TBD   CHA2DS2-VASc Score = 4  This indicates a 4.8% annual risk of stroke. The patient's score is based upon: CHF History: 0 HTN History: 1 Diabetes History: 0 Stroke History: 0 Vascular Disease History: 1 Age Score: 1 Gender Score: 1    CrCl 115 mL/min using adjusted body weight Platelet count 290K  Per office protocol, patient can hold Eliquis for 2 days prior to procedure.     **This guidance is not considered finalized until pre-operative APP has relayed final recommendations.**

## 2023-03-18 NOTE — Telephone Encounter (Signed)
-----   Message from Claudia [name] sent at 03/09/2023  3:54 PM EST ----- Can you review her anticoagulation? She needs heel spur surgery. Ok to hold Eliquis x 2 days? Thanks [employer]

## 2023-03-26 ENCOUNTER — Other Ambulatory Visit: Payer: Self-pay | Admitting: Internal Medicine

## 2023-03-26 DIAGNOSIS — R112 Nausea with vomiting, unspecified: Secondary | ICD-10-CM

## 2023-03-26 MED ORDER — ONDANSETRON HCL 8 MG PO TABS: ORAL_TABLET | ORAL | 0 refills | Status: DC

## 2023-03-26 MED ORDER — DICYCLOMINE HCL 20 MG PO TABS: ORAL_TABLET | ORAL | 1 refills | Status: AC

## 2023-03-31 ENCOUNTER — Other Ambulatory Visit (HOSPITAL_COMMUNITY): Payer: Self-pay

## 2023-04-01 ENCOUNTER — Other Ambulatory Visit: Payer: Self-pay | Admitting: Internal Medicine

## 2023-04-01 DIAGNOSIS — N3281 Overactive bladder: Secondary | ICD-10-CM

## 2023-04-04 NOTE — Telephone Encounter (Signed)
   Patient Name: ELESHIA ZIERDEN  DOB: [birth year] MRN: [id number]  Primary Cardiologist: Charlton Haws, MD  Chart reviewed as part of pre-operative protocol coverage. Pre-op clearance already addressed by colleagues in earlier phone notes. To summarize recommendations:  -Ms. Dimaria's perioperative risk of a major cardiac event is 0.4% according to the Revised Cardiac Risk Index (RCRI).  Therefore, she is at low risk for perioperative complications.   Her functional capacity is good at 4.31 METs according to the Duke Activity Status Index (DASI). Recommendations: I will review further with Dr. Gasper Lloyd with the CHF clinic to see if we need to do any further testing for her pulmonary hypertension prior to surgery. Antiplatelet and/or Anticoagulation Recommendations: Eliquis (Apixaban) can be held for 2 days prior to surgery.  Please resume post op when felt to be safe.     Addendum 03/11/23: I reviewed with Dr. Gasper Lloyd. From a pulmonary hypertension standpoint, she can proceed with her surgery. Therefore, she may proceed at acceptable risk.    Will route this bundled recommendation to requesting provider via Epic fax function and remove from pre-op pool. Please call with questions.  Sharlene Dory, PA-C 04/04/2023, 11:33 AM

## 2023-04-05 ENCOUNTER — Other Ambulatory Visit: Payer: Self-pay | Admitting: Internal Medicine

## 2023-04-05 DIAGNOSIS — R112 Nausea with vomiting, unspecified: Secondary | ICD-10-CM

## 2023-04-05 MED ORDER — ONDANSETRON HCL 8 MG PO TABS: ORAL_TABLET | ORAL | 0 refills | Status: DC

## 2023-04-13 ENCOUNTER — Telehealth (HOSPITAL_COMMUNITY): Payer: Self-pay | Admitting: Cardiology

## 2023-04-13 DIAGNOSIS — R0602 Shortness of breath: Secondary | ICD-10-CM

## 2023-04-13 NOTE — Telephone Encounter (Signed)
 Patient left VM on triage line to report SOB, weakness, jitters, and light headiness  Felt it could be new med (Valsartan  HCT)    Returned call to patient for additional information  Reports she has done some research and noticed Wegovy  may have similar symptoms/side effect. Reports she may not continue med  -reports symptoms comes and goes,  -reports symptoms above get better when she has something to eat and drink -event today subsided/went away much quicker than it has in the past -symptoms started Mon 1/6   B/p 04/12/23 148/80   Please advise

## 2023-04-15 NOTE — Telephone Encounter (Signed)
 PT AWARE  REPEAT LABS 1/13

## 2023-04-18 ENCOUNTER — Ambulatory Visit (HOSPITAL_COMMUNITY)
Admission: RE | Admit: 2023-04-18 | Discharge: 2023-04-18 | Disposition: A | Discharge status: Home / Self Care | Payer: Medicare Other | Source: Ambulatory Visit | Attending: Cardiology | Admitting: Cardiology

## 2023-04-18 DIAGNOSIS — R0602 Shortness of breath: Secondary | ICD-10-CM | POA: Insufficient documentation

## 2023-04-18 LAB — BASIC METABOLIC PANEL
Anion gap: 8 (ref 5–15)
BUN: 16 mg/dL (ref 8–23)
CO2: 26 mmol/L (ref 22–32)
Calcium: 9.4 mg/dL (ref 8.9–10.3)
Chloride: 103 mmol/L (ref 98–111)
Creatinine, Ser: 0.88 mg/dL (ref 0.44–1.00)
GFR, Estimated: >60 mL/min (ref >60)
Glucose, Bld: 106 mg/dL — ABNORMAL HIGH (ref 70–99)
Potassium: 4.4 mmol/L (ref 3.5–5.1)
Sodium: 137 mmol/L (ref 135–145)

## 2023-04-18 LAB — BRAIN NATRIURETIC PEPTIDE: B Natriuretic Peptide: 125.4 pg/mL — ABNORMAL HIGH (ref 0.0–100.0)

## 2023-04-22 DIAGNOSIS — M170 Bilateral primary osteoarthritis of knee: Secondary | ICD-10-CM | POA: Diagnosis not present

## 2023-04-25 ENCOUNTER — Telehealth (HOSPITAL_COMMUNITY): Payer: Self-pay | Admitting: Cardiology

## 2023-04-25 ENCOUNTER — Encounter: Payer: Self-pay | Admitting: Nurse Practitioner

## 2023-04-25 NOTE — Telephone Encounter (Signed)
Patient called to report she was told by PCP continued symptoms of SOB jitters and light headiness are likely not related to wegovy as pt has been without x 1 week  Labs WNL last week  Pt request an appt with Dr Gasper Lloyd to discuss next options.  Appt 1/23

## 2023-04-28 ENCOUNTER — Ambulatory Visit (HOSPITAL_COMMUNITY)
Admission: RE | Admit: 2023-04-28 | Discharge: 2023-04-28 | Disposition: A | Discharge status: Home / Self Care | Payer: Medicare Other | Source: Ambulatory Visit | Attending: Cardiology | Admitting: Cardiology

## 2023-04-28 ENCOUNTER — Encounter (HOSPITAL_COMMUNITY): Payer: Self-pay | Admitting: Cardiology

## 2023-04-28 VITALS — BP 134/86 | HR 61 | Ht 67.0 in | Wt 228.4 lb

## 2023-04-28 DIAGNOSIS — I272 Pulmonary hypertension, unspecified: Secondary | ICD-10-CM | POA: Insufficient documentation

## 2023-04-28 DIAGNOSIS — I10 Essential (primary) hypertension: Secondary | ICD-10-CM | POA: Diagnosis present

## 2023-04-28 DIAGNOSIS — I110 Hypertensive heart disease with heart failure: Secondary | ICD-10-CM | POA: Insufficient documentation

## 2023-04-28 DIAGNOSIS — E119 Type 2 diabetes mellitus without complications: Secondary | ICD-10-CM | POA: Diagnosis not present

## 2023-04-28 DIAGNOSIS — E785 Hyperlipidemia, unspecified: Secondary | ICD-10-CM | POA: Insufficient documentation

## 2023-04-28 DIAGNOSIS — I480 Paroxysmal atrial fibrillation: Secondary | ICD-10-CM | POA: Diagnosis not present

## 2023-04-28 DIAGNOSIS — I5032 Chronic diastolic (congestive) heart failure: Secondary | ICD-10-CM | POA: Diagnosis not present

## 2023-04-28 DIAGNOSIS — G4733 Obstructive sleep apnea (adult) (pediatric): Secondary | ICD-10-CM | POA: Diagnosis not present

## 2023-04-28 NOTE — Patient Instructions (Signed)
Medication Changes:  NO CHANGES     Follow-Up in: IN 3 MONTHS WITH DR. Gasper Lloyd. GIVE OUR OFFICE A CALL MARCH TO SCHEDULE APPOINTMENT.   At the [facility], you and your health needs are our priority. We have a designated team specialized in the treatment of Heart Failure. This Care Team includes your primary Heart Failure Specialized Cardiologist (physician), Advanced Practice Providers (APPs- Physician Assistants and Nurse Practitioners), and Pharmacist who all work together to provide you with the care you need, when you need it.   You may see any of the following providers on your designated Care Team at your next follow up:  Dr. Arvilla Meres Dr. Marca Ancona Dr. Dorthula Nettles Dr. Theresia Bough Tonye Becket, NP Robbie Lis, [place] [facility] [place], [place] Brynda Peon, NP [place] Lee, NP Karle Plumber, PharmD   Please be sure to bring in all your medications bottles to every appointment.   Need to Contact us:  If you have any questions or concerns before your next appointment please send [place] a message through [place] or call our office at [phone].    TO LEAVE A MESSAGE FOR THE NURSE SELECT OPTION 2, PLEASE LEAVE A MESSAGE INCLUDING: YOUR NAME DATE OF BIRTH CALL BACK NUMBER REASON FOR CALL**this is important as we prioritize the call backs  YOU WILL RECEIVE A CALL BACK THE SAME DAY AS LONG AS YOU CALL BEFORE 4:00 PM

## 2023-04-28 NOTE — Progress Notes (Signed)
[facility]ED [facility] CLINIC NOTE  Referring Physician: Lucky Cowboy, MD  Primary Care: Lucky Cowboy, MD Primary Cardiologist: Charlton Haws, MD  HPI: Claudia [name] is a 73 y.o. female with hypertension, paroxysmal atrial fibrillation, type 2 diabetes, obstructive sleep apnea presenting today to establish care and for evaluation of pulmonary hypertension.  Her cardiac history dates back to April 2015 when she presented with complaints of palpitations.  Echocardiogram at that time with a EF of 60 to 65%.  She was diagnosed with paroxysmal atrial fibrillation and started on Eliquis.  She reports doing fairly well until she gained a lot of weight over the summer due to poor nutrition.  She reports that during this time she started to have shortness of breath and lower extremity edema.  She had an echocardiogram with RVSP approaching 70 mmHg.  She presents today for evaluation of pulmonary hypertension.  Interval hx:  - She started [facility] prior to Thanksgiving and valsartan 03/21/23. She reports feeling "sinking spells" after being on both medications. This occurred in the morning; she reports feeling weak/shaky and short of breath. Since stopping Wegovy these episodes have started improving. Otherwise doing well and with no complaints.   Activity level/exercise tolerance: NYHA IIb Orthopnea:  Sleeps on 2 pillows Paroxysmal noctural dyspnea: No Chest pain/pressure:  no Orthostatic lightheadedness:  no Palpitations:  no Lower extremity edema:  no Presyncope/syncope:  no Cough:  no  Past Medical History:  Diagnosis Date   Achilles tendonitis    R heal, limits activity   Allergy    Anxiety    Elevated hemoglobin A1c    Hyperlipidemia    Hypertension    IBS (irritable bowel syndrome)    Insomnia    Obesity    Palpitations    SVT (supraventricular tachycardia) (HCC) 10/29/2013   Vitamin D deficiency     Current Outpatient Medications  Medication Sig Dispense Refill    Ascorbic Acid (VITAMIN C PO) Take 500 mg by mouth 2 (two) times daily.     Cholecalciferol (VITAMIN D-3 PO) Take 4,000 Units by mouth in the morning and at bedtime.     CINNAMON PO Take 1,000 mg by mouth at bedtime.     Cyanocobalamin (VITAMIN B-12 PO) Take 500 mcg by mouth in the morning.     diclofenac Sodium (VOLTAREN) 1 % GEL Apply 4 g topically 4 (four) times daily as needed.     dicyclomine (BENTYL) 20 MG tablet Take  1/2 to 1 tablet   4 x /day  before meals & Bedtime for Nausea, Cramping, Bloating or Diarrhea 60 tablet 1   diphenhydrAMINE (BENADRYL) 25 mg capsule Take 25 mg by mouth at bedtime.     ELIQUIS 5 MG TABS tablet TAKE 1 TABLET TWICE A DAY 180 tablet 3   HOMEOPATHIC PRODUCTS EX Place 1 patch onto the skin daily. Natural Knee Pain Patch-Herbal Knee Patches (Peppermint/Capsicum/Camphor/Ginger/Safflower/[place] extracts) Applied knee/heel.     lidocaine (LIDODERM) 5 % Place 1 patch onto the skin daily as needed (pain.).     loperamide (IMODIUM A-D) 2 MG tablet Take 6 mg by mouth in the morning.     Menaquinone-7 (VITAMIN K2 PO) Take 100 mcg by mouth in the morning.     metoprolol succinate (TOPROL-XL) 25 MG 24 hr tablet TAKE ONE-HALF (1/2) TABLET DAILY 45 tablet 3   Omega-3 Fatty Acids (FISH OIL) 1000 MG CAPS Take 1,000 mg by mouth at bedtime.     ondansetron (ZOFRAN) 8 MG tablet Take 1/2 to  1 tablet 3 x / day (every 6 hours) as needed for nausea / Vomitting 90 tablet 0   oxybutynin (DITROPAN-XL) 10 MG 24 hr tablet TAKE 1 TABLET AT SUPPERTIME FOR OVERACTIVE BLADDER 90 tablet 3   pantoprazole (PROTONIX) 40 MG tablet Take  1 tablet  Daily  to Prevent Heartburn &  Indigestion 90 tablet 3   pravastatin (PRAVACHOL) 20 MG tablet Take 1 tablet (20 mg total) by mouth daily. 30 tablet 0   Simethicone (GAS-X MAXIMUM STRENGTH PO) Take 1 tablet by mouth at bedtime.     valsartan-hydrochlorothiazide (DIOVAN HCT) 320-25 MG tablet Take 1 tablet by mouth daily. 90 tablet 3   ZINC GLUCONATE PO Take  25 mg by mouth at bedtime.     No current facility-administered medications for this encounter.    Allergies  Allergen Reactions   Amlodipine Shortness Of Breath   Ciprofloxacin Other (See Comments)    "Felt worse"   Levaquin [Levofloxacin In D5w]     No fluroquinolones due to ascending thoracic aortic aneurysm   Penicillins Other (See Comments)    Had as a child=unknown reaction   Zoloft [Sertraline Hcl] Other (See Comments)    "Felt bad"      Social History   Socioeconomic History   Marital status: Married    Spouse name: Not on file   Number of children: Not on file   Years of education: Not on file   Highest education level: Not on file  Occupational History   Not on file  Tobacco Use   Smoking status: Former    Current packs/day: 0.00    Types: Cigarettes    Quit date: 04/01/2004    Years since quitting: 19.0   Smokeless tobacco: Never   Tobacco comments:    Social smoker times 10 years  Vaping Use   Vaping status: Never Used  Substance and Sexual Activity   Alcohol use: Yes    Alcohol/week: 0.0 standard drinks of alcohol    Comment: rarely, < 1 beer per week   Drug use: No   Sexual activity: Not on file  Other Topics Concern   Not on file  Social History Narrative   Pt lives in [place] with spouse.  Retired from [employer].   Social Drivers of Corporate investment banker Strain: Not on file  Food Insecurity: Not on file  Transportation Needs: Not on file  Physical Activity: Not on file  Stress: Not on file  Social Connections: Not on file  Intimate Partner Violence: Not on file      Family History  Problem Relation Age of Onset   Heart attack Mother    Stroke Mother    Heart disease Mother    Diabetes Mother    Macular degeneration Mother    Heart attack Father    Alcohol abuse Father    Ovarian cancer Maternal Grandmother    Colon cancer Neg Hx     PHYSICAL EXAM: Vitals:   04/28/23 1350  BP: 134/86  Pulse: 61  SpO2:  98%   GENERAL: Well nourished, well developed, and in no apparent distress at rest.  HEENT: There is no scleral icterus.  The mucous membranes are pink and moist.   CHEST: There are no chest wall deformities. There is no chest wall tenderness. Respirations are unlabored.  Lungs- CTA B/L CARDIAC:  JVP: less than 5 cm          Normal rate with regular rhythm. no murmur.  Pulses are 2+ and symmetrical in upper and lower extremities. no edema.  ABDOMEN: Soft, non-tender, non-distended. There are normal bowel sounds.  EXTREMITIES: Warm and well perfused.  NEUROLOGIC: Patient is oriented x3 with no obvious focal neurologic deficits.  PSYCH: Patients affect is appropriate SKIN: Warm and dry; no lesions or wounds.    DATA REVIEW  ECG: NSR  As per my personal interpretation  ECHO: 12/24/22: LVEF 75-70%, normal RV function with PASP of As per my personal interpretation  RHC: 04/27/22: HEMODYNAMICS: RA:                  3 mmHg (mean) RV:                  60/3 mmHg L-PA:               58/20 mmHg (36 mean) L-PCWP:         Mean 20 mmHg with V waves to 43 R-PCWP:        Mean with V waves to NIBP: 180s/90s                                      Estimated Fick CO/CI   6.2 L/min, 2.8 L/min/m2                                      TPG                 16  mmHg                                            PVR                 2.6 Wood Units  PAPi                12   After IV hydralazine 20mg : NIBP: 140s/70s  PA: 49/15 PCWP: mean 14 with V waves to 30    ASSESSMENT & PLAN:  Evaluation for pulmonary hypertension -Reports progression of shortness of breath over the summer when she gained significant weight. -Echocardiogram today (12/23/2022) with RVSP approaching 70 mmHg.  Left atrium is also mild to moderately elevated.  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 -RHC on 04/27/22 with mildly elevated PVR and large  V waves likely secondary to stiff left atrium syndrome secondary to atrial fibrillation and HFpEF. Since cardioversion she has been doing better though. I believe her symptoms of 'weakness' in the morning are likely secondary to hypovolemia. JVP now less than 5cm. 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   2.  Paroxysmal atrial fibrillation -Continue toprol 12.5mg  daily.  -Continue apixaban 5 mg twice daily. -Continue follow-up with cardiology.  3.  Hypertension -Continue diovan.   4.  Hyperlipidemia -Continue pravastatin 20 mg daily.  5. Right atrial echodensity - Reported on TTE from 12/23/22 - CMR pending.   Will refer back to general cardiology clinic.    Phill Steck [facility] Mechanical Circulatory Support

## 2023-05-12 ENCOUNTER — Other Ambulatory Visit: Payer: Self-pay | Admitting: Orthopaedic Surgery

## 2023-05-13 ENCOUNTER — Encounter: Payer: Self-pay | Admitting: Cardiovascular Disease

## 2023-05-16 ENCOUNTER — Other Ambulatory Visit: Payer: Self-pay | Admitting: Cardiovascular Disease

## 2023-05-16 NOTE — Progress Notes (Addendum)
COVID Vaccine Completed: yes  Date of COVID positive in last 90 days: no  PCP - Lutricia Horsfall, MD has not seen yet Cardiologist - Charlton Haws, MD  Cardiac clearance by Jari Favre, PA 04/04/23 in Epic   CT- 10/27/22 Epic Chest x-ray - n/a EKG - 04/28/23 Epic  Stress Test - 10/26/22 Epic ECHO - 12/23/22 Epic Cardiac Cath - 02/01/23 Epic Pacemaker/ICD device last checked: n/a Spinal Cord Stimulator: n/a  Bowel Prep - no  Sleep Study - n/a CPAP -   Fasting Blood Sugar - preDM, no meds or checks Checks Blood Sugar _____ times a day  Last dose of GLP1 agonist-  N/A GLP1 instructions:  Hold 7 days before surgery    Last dose of SGLT-2 inhibitors-  N/A SGLT-2 instructions:  Hold 3 days before surgery    Blood Thinner Instructions:  Eliquis, hold 2 days Aspirin Instructions: Last Dose: 05/28/23 2000  Activity level: Can go up a flight of stairs and perform activities of daily living without stopping and without symptoms of chest pain or shortness of breath.  Anesthesia review: HTN, a fib, SVT, thoracic aortic aneurysm,   Patient denies shortness of breath, fever, cough and chest pain at PAT appointment  Patient verbalized understanding of instructions that were given to them at the PAT appointment. Patient was also instructed that they will need to review over the PAT instructions again at home before surgery.

## 2023-05-16 NOTE — Patient Instructions (Addendum)
 SURGICAL WAITING ROOM VISITATION  Patients having surgery or a procedure may have no more than 2 support people in the waiting area - these visitors may rotate.    Children under the age of 72 must have an adult with them who is not the patient.  Due to an increase in RSV and influenza rates and associated hospitalizations, children ages 54 and under may not visit patients in [facility] hospitals.  Visitors with respiratory illnesses are discouraged from visiting and should remain at home.  If the patient needs to stay at the hospital during part of their recovery, the visitor guidelines for inpatient rooms apply. Pre-op nurse will coordinate an appropriate time for 1 support person to accompany patient in pre-op.  This support person may not rotate.    Please refer to the [facility] website for the visitor guidelines for Inpatients (after your surgery is over and you are in a regular room).    Your procedure is scheduled on: 05/31/23   Report to [facility] Main Entrance    Report to admitting at 9:45 AM   Call this number if you have problems the morning of surgery [phone]   Do not eat food :After Midnight.   After Midnight you may have the following liquids until 9:00 AM DAY OF SURGERY  Water Non-Citrus Juices (without pulp, NO RED-Apple, White grape, White cranberry) Black Coffee (NO MILK/CREAM OR CREAMERS, sugar ok)  Clear Tea (NO MILK/CREAM OR CREAMERS, sugar ok) regular and decaf                             Plain Jell-O (NO RED)                                           Fruit ices (not with fruit pulp, NO RED)                                     Popsicles (NO RED)                                                               Sports drinks like Gatorade (NO RED)               The day of surgery:  Drink ONE (1) Pre-Surgery G2 at 9:00 AM the morning of surgery. Drink in one sitting. Do not sip.  This drink was given to you during your hospital  pre-op  appointment visit. Nothing else to drink after completing the  Pre-Surgery G2.          If you have questions, please contact your surgeon's office.   FOLLOW BOWEL PREP AND ANY ADDITIONAL PRE OP INSTRUCTIONS YOU RECEIVED FROM YOUR SURGEON'S OFFICE!!!     Oral Hygiene is also important to reduce your risk of infection.                                    Remember - BRUSH YOUR TEETH THE  MORNING OF SURGERY WITH YOUR REGULAR TOOTHPASTE  DENTURES WILL BE REMOVED PRIOR TO SURGERY PLEASE DO NOT APPLY "Poly grip" OR ADHESIVES!!!   Stop all vitamins and herbal supplements 7 days before surgery.   Take these medicines the morning of surgery with A SIP OF WATER: Metoprolol, Zofran, Ditropan, Pantoprazole, Pravastatin                               You may not have any metal on your body including hair pins, jewelry, and body piercing             Do not wear make-up, lotions, powders, perfumes, or deodorant  Do not wear nail polish including gel and S&S, artificial/acrylic nails, or any other type of covering on natural nails including finger and toenails. If you have artificial nails, gel coating, etc. that needs to be removed by a nail salon please have this removed prior to surgery or surgery may need to be canceled/ delayed if the surgeon/ anesthesia feels like they are unable to be safely monitored.   Do not shave  48 hours prior to surgery.    Do not bring valuables to the hospital. [facility] IS NOT             RESPONSIBLE   FOR VALUABLES.   Contacts, glasses, dentures or bridgework may not be worn into surgery.  DO NOT BRING YOUR HOME MEDICATIONS TO THE HOSPITAL. PHARMACY WILL DISPENSE MEDICATIONS LISTED ON YOUR MEDICATION LIST TO YOU DURING YOUR ADMISSION IN THE HOSPITAL!    Patients discharged on the day of surgery will not be allowed to drive home.  Someone NEEDS to stay with you for the first 24 hours after anesthesia.              Please read over the following fact sheets you  were given: IF YOU HAVE QUESTIONS ABOUT YOUR PRE-OP INSTRUCTIONS PLEASE CALL [phone] Contras    If you received a COVID test during your pre-op visit  it is requested that you wear a mask when out in public, stay away from anyone that may not be feeling well and notify your surgeon if you develop symptoms. If you test positive for Covid or have been in contact with anyone that has tested positive in the last 10 days please notify you surgeon.    [facility] - Preparing for Surgery Before surgery, you can play an important role.  Because skin is not sterile, your skin needs to be as free of germs as possible.  You can reduce the number of germs on your skin by washing with CHG (chlorahexidine gluconate) soap before surgery.  CHG is an antiseptic cleaner which kills germs and bonds with the skin to continue killing germs even after washing. Please DO NOT use if you have an allergy to CHG or antibacterial soaps.  If your skin becomes reddened/irritated stop using the CHG and inform your nurse when you arrive at Short Stay. Do not shave (including legs and underarms) for at least 48 hours prior to the first CHG shower.  You may shave your face/neck.  Please follow these instructions carefully:  1.  Shower with CHG Soap the night before surgery and the  morning of surgery.  2.  If you choose to wash your hair, wash your hair first as usual with your normal  shampoo.  3.  After you shampoo, rinse your hair and body thoroughly to  remove the shampoo.                             4.  Use CHG as you would any other liquid soap.  You can apply chg directly to the skin and wash.  Gently with a scrungie or clean washcloth.  5.  Apply the CHG Soap to your body ONLY FROM THE NECK DOWN.   Do   not use on face/ open                           Wound or open sores. Avoid contact with eyes, ears mouth and   genitals (private parts).                       Wash face,  Genitals (private parts) with your normal soap.              6.  Wash thoroughly, paying special attention to the area where your    surgery  will be performed.  7.  Thoroughly rinse your body with warm water from the neck down.  8.  DO NOT shower/wash with your normal soap after using and rinsing off the CHG Soap.                9.  Pat yourself dry with a clean towel.            10.  Wear clean pajamas.            11.  Place clean sheets on your bed the night of your first shower and do not  sleep with pets. Day of Surgery : Do not apply any lotions/deodorants the morning of surgery.  Please wear clean clothes to the hospital/surgery center.  FAILURE TO FOLLOW THESE INSTRUCTIONS MAY RESULT IN THE CANCELLATION OF YOUR SURGERY  PATIENT SIGNATURE_________________________________  NURSE SIGNATURE__________________________________  ________________________________________________________________________  Rogelia Mire  An incentive spirometer is a tool that can help keep your lungs clear and active. This tool measures how well you are filling your lungs with each breath. Taking long deep breaths may help reverse or decrease the chance of developing breathing (pulmonary) problems (especially infection) following: A long period of time when you are unable to move or be active. BEFORE THE PROCEDURE  If the spirometer includes an indicator to show your best effort, your nurse or respiratory therapist will set it to a desired goal. If possible, sit up straight or lean slightly forward. Try not to slouch. Hold the incentive spirometer in an upright position. INSTRUCTIONS FOR USE  Sit on the edge of your bed if possible, or sit up as far as you can in bed or on a chair. Hold the incentive spirometer in an upright position. Breathe out normally. Place the mouthpiece in your mouth and seal your lips tightly around it. Breathe in slowly and as deeply as possible, raising the piston or the ball toward the top of the column. Hold your breath  for 3-5 seconds or for as long as possible. Allow the piston or ball to fall to the bottom of the column. Remove the mouthpiece from your mouth and breathe out normally. Rest for a few seconds and repeat Steps 1 through 7 at least 10 times every 1-2 hours when you are awake. Take your time and take a few normal breaths between deep breaths. The spirometer may include an  indicator to show your best effort. Use the indicator as a goal to work toward during each repetition. After each set of 10 deep breaths, practice coughing to be sure your lungs are clear. If you have an incision (the cut made at the time of surgery), support your incision when coughing by placing a pillow or rolled up towels firmly against it. Once you are able to get out of bed, walk around indoors and cough well. You may stop using the incentive spirometer when instructed by your caregiver.  RISKS AND COMPLICATIONS Take your time so you do not get dizzy or light-headed. If you are in pain, you may need to take or ask for pain medication before doing incentive spirometry. It is harder to take a deep breath if you are having pain. AFTER USE Rest and breathe slowly and easily. It can be helpful to keep track of a log of your progress. Your caregiver can provide you with a simple table to help with this. If you are using the spirometer at home, follow these instructions: SEEK MEDICAL CARE IF:  You are having difficultly using the spirometer. You have trouble using the spirometer as often as instructed. Your pain medication is not giving enough relief while using the spirometer. You develop fever of 100.5 F (38.1 C) or higher. SEEK IMMEDIATE MEDICAL CARE IF:  You cough up bloody sputum that had not been present before. You develop fever of 102 F (38.9 C) or greater. You develop worsening pain at or near the incision site. MAKE SURE YOU:  Understand these instructions. Will watch your condition. Will get help right away if  you are not doing well or get worse. Document Released: 08/02/2006 Document Revised: 06/14/2011 Document Reviewed: 10/03/2006 [facility] Patient Information 2014 [place], [place].   ________________________________________________________________________

## 2023-05-17 ENCOUNTER — Encounter (HOSPITAL_COMMUNITY): Payer: Self-pay

## 2023-05-17 ENCOUNTER — Encounter (HOSPITAL_COMMUNITY)
Admission: RE | Admit: 2023-05-17 | Discharge: 2023-05-17 | Disposition: A | Discharge status: Home / Self Care | Payer: Medicare Other | Source: Ambulatory Visit | Attending: Orthopaedic Surgery | Admitting: Orthopaedic Surgery

## 2023-05-17 ENCOUNTER — Other Ambulatory Visit: Payer: Self-pay

## 2023-05-17 VITALS — BP 147/84 | HR 58 | Temp 98.4°F | Resp 16 | Ht 67.0 in | Wt 223.4 lb

## 2023-05-17 DIAGNOSIS — I10 Essential (primary) hypertension: Secondary | ICD-10-CM | POA: Diagnosis not present

## 2023-05-17 DIAGNOSIS — Z01818 Encounter for other preprocedural examination: Secondary | ICD-10-CM | POA: Diagnosis present

## 2023-05-17 DIAGNOSIS — Z01812 Encounter for preprocedural laboratory examination: Secondary | ICD-10-CM | POA: Insufficient documentation

## 2023-05-17 HISTORY — DX: Pneumonia, unspecified organism: J18.9

## 2023-05-17 HISTORY — DX: Unspecified osteoarthritis, unspecified site: M19.90

## 2023-05-17 HISTORY — DX: Cardiac murmur, unspecified: R01.1

## 2023-05-17 HISTORY — DX: Gastro-esophageal reflux disease without esophagitis: K21.9

## 2023-05-17 LAB — BASIC METABOLIC PANEL
Anion gap: 9 (ref 5–15)
BUN: 17 mg/dL (ref 8–23)
CO2: 26 mmol/L (ref 22–32)
Calcium: 9.2 mg/dL (ref 8.9–10.3)
Chloride: 100 mmol/L (ref 98–111)
Creatinine, Ser: 0.71 mg/dL (ref 0.44–1.00)
GFR, Estimated: >60 mL/min (ref >60)
Glucose, Bld: 106 mg/dL — ABNORMAL HIGH (ref 70–99)
Potassium: 3.9 mmol/L (ref 3.5–5.1)
Sodium: 135 mmol/L (ref 135–145)

## 2023-05-17 LAB — CBC
HCT: 36.8 % (ref 36.0–46.0)
Hemoglobin: 11.6 g/dL — ABNORMAL LOW (ref 12.0–15.0)
MCH: 28.3 pg (ref 26.0–34.0)
MCHC: 31.5 g/dL (ref 30.0–36.0)
MCV: 89.8 fL (ref 80.0–100.0)
Platelets: 277 10*3/uL (ref 150–400)
RBC: 4.1 MIL/uL (ref 3.87–5.11)
RDW: 13.4 % (ref 11.5–15.5)
WBC: 6.7 10*3/uL (ref 4.0–10.5)
nRBC: 0 % (ref 0.0–0.2)

## 2023-05-18 ENCOUNTER — Encounter (HOSPITAL_COMMUNITY): Payer: Self-pay

## 2023-05-19 ENCOUNTER — Ambulatory Visit: Payer: TRICARE For Life (TFL) | Admitting: Nurse Practitioner

## 2023-05-19 NOTE — Anesthesia Preprocedure Evaluation (Addendum)
 Anesthesia Evaluation  Patient identified by MRN, date of birth, ID band Patient awake    Reviewed: Allergy & Precautions, NPO status , Patient's Chart, lab work & pertinent test results, reviewed documented beta blocker date and time   Airway Mallampati: III  TM Distance: >3 FB Neck ROM: Full    Dental  (+) Teeth Intact, Dental Advisory Given   Pulmonary former smoker Snores at night, has never had sleep study    Pulmonary exam normal breath sounds clear to auscultation       Cardiovascular hypertension (151/68 preop, per pt normally 130s SBP), Pt. on medications and Pt. on home beta blockers pulmonary hypertension (mod pHTN on cath)Normal cardiovascular exam+ dysrhythmias (eliquis LD 3/4) Atrial Fibrillation and Supra Ventricular Tachycardia  Rhythm:Regular Rate:Normal  CV: Myocardial Perfusion 10/26/2022   The study is normal. The study is low risk.   No ST deviation was noted.   LV perfusion is normal. There is no evidence of ischemia. There is no evidence of infarction.   Left ventricular function is normal. Nuclear stress EF: 66%. The left ventricular ejection fraction is hyperdynamic (>65%). End diastolic cavity size is normal. End systolic cavity size is normal. No evidence of transient ischemic dilation (TID) noted.   Prior study available for comparison. No significant changes compared to prior study.   Echo 12/23/2022  1. Left ventricular ejection fraction, by estimation, is 65 to 70%. The  left ventricle has normal function. The left ventricle has no regional  wall motion abnormalities. Left ventricular diastolic parameters are  consistent with Grade II diastolic  dysfunction (pseudonormalization). Elevated left ventricular end-diastolic  pressure.   2. Right ventricular systolic function is normal. The right ventricular  size is mildly enlarged. There is severely elevated pulmonary artery  systolic pressure. The  estimated right ventricular systolic pressure is  68.9 mmHg.   3. Left atrial size was moderately dilated.   4. There is a mobile density on the right side of the interatrial septum  measuring 2.8 x 1.6 cm. Not seen on echo dated 01/2022. Recommend cardiac  MRI for further evaluation. Right atrial size was mildly dilated.   5. The mitral valve is normal in structure. Mild mitral valve  regurgitation.   6. Tricuspid valve regurgitation is mild to moderate.   7. The aortic valve is tricuspid. Aortic valve regurgitation is trivial.  Aortic valve sclerosis is present, with no evidence of aortic valve  stenosis.   8. Aortic dilatation noted. There is mild dilatation of the ascending  aorta, measuring 40 mm.   9. The inferior vena cava is dilated in size with <50% respiratory  variability, suggesting right atrial pressure of 15 mmHg.    Cardia Cath 02/01/2023 IMPRESSION: 1. 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 2. 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  3. Improvement in PA mean and PCWP with afterload reduction.  4. Normal cardiac output/index.  5. 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     Neuro/Psych  PSYCHIATRIC DISORDERS Anxiety     negative neurological ROS     GI/Hepatic Neg liver ROS,GERD  ,,  Endo/Other  Obesity BMI 35  Renal/GU negative Renal ROS  negative genitourinary   Musculoskeletal  (+) Arthritis , Osteoarthritis,    Abdominal   Peds  Hematology negative hematology ROS (+)   Anesthesia Other Findings   Reproductive/Obstetrics negative OB ROS  Anesthesia Physical Anesthesia Plan  ASA: 4  Anesthesia Plan: General and Regional   Post-op Pain Management: Tylenol PO (pre-op)* and Regional block*   Induction: Intravenous  PONV Risk Score and Plan: 3 and  Ondansetron, Dexamethasone, Midazolam and Treatment may vary due to age or medical condition  Airway Management Planned: Oral ETT  Additional Equipment: ClearSight  Intra-op Plan:   Post-operative Plan: Extubation in OR  Informed Consent: I have reviewed the patients History and Physical, chart, labs and discussed the procedure including the risks, benefits and alternatives for the proposed anesthesia with the patient or authorized representative who has indicated his/her understanding and acceptance.     Dental advisory given  Plan Discussed with: CRNA  Anesthesia Plan Comments:        Anesthesia Quick Evaluation

## 2023-05-19 NOTE — Progress Notes (Signed)
Anesthesia Chart Review   Case: [id number] Date/Time: 05/31/23 1143   Procedure: RIGHT HEEL ACHILLES TENDON REPAIR AND SPUR EXCISION (Right)   Anesthesia type: General   Pre-op diagnosis: RIGHT HEEL PUMP BUMP AND ACHILLES SPUR   Location: [facility] ROOM 06 / [facility]   Surgeons: Marcene Corning, MD       DISCUSSION:73 y.o. former smoker with h/o HTN, pulmonary HTN scheduled for above procedure 05/31/2023 with Dr. Marcene Corning.   Per cardiology preoperative evaluation 04/04/2023, "Chart reviewed as part of pre-operative protocol coverage. Pre-op clearance already addressed by colleagues in earlier phone notes. To summarize recommendations:   -Ms. Hohman's perioperative risk of a major cardiac event is 0.4% according to the Revised Cardiac Risk Index (RCRI).  Therefore, she is at low risk for perioperative complications.   Her functional capacity is good at 4.31 METs according to the [employer] Activity Status Index (DASI). Recommendations: I will review further with Dr. Gasper Lloyd with the CHF clinic to see if we need to do any further testing for her pulmonary hypertension prior to surgery. Antiplatelet and/or Anticoagulation Recommendations: Eliquis (Apixaban) can be held for 2 days prior to surgery.  Please resume post op when felt to be safe.     Addendum 03/11/23: I reviewed with Dr. Gasper Lloyd. From a pulmonary hypertension standpoint, she can proceed with her surgery. Therefore, she may proceed at acceptable risk."  VS: BP (!) 147/84   Pulse (!) 58   Temp 36.9 C (Oral)   Resp 16   Ht 5\' 7"  (1.702 m)   Wt 101.3 kg   SpO2 97%   BMI 34.99 kg/m   PROVIDERS: Jeani Sow, MD is PCP  Primary Cardiologist: Charlton Haws, MD  LABS: Labs reviewed: Acceptable for surgery. (all labs ordered are listed, but only abnormal results are displayed)  Labs Reviewed  BASIC METABOLIC PANEL - Abnormal; Notable for the following components:      Result Value   Glucose, Bld 106 (*)    All other  components within normal limits  CBC - Abnormal; Notable for the following components:   Hemoglobin 11.6 (*)    All other components within normal limits     IMAGES:   EKG:   CV: Myocardial Perfusion 10/26/2022   The study is normal. The study is low risk.   No ST deviation was noted.   LV perfusion is normal. There is no evidence of ischemia. There is no evidence of infarction.   Left ventricular function is normal. Nuclear stress EF: 66%. The left ventricular ejection fraction is hyperdynamic (>65%). End diastolic cavity size is normal. End systolic cavity size is normal. No evidence of transient ischemic dilation (TID) noted.   Prior study available for comparison. No significant changes compared to prior study.  Echo 12/23/2022  1. Left ventricular ejection fraction, by estimation, is 65 to 70%. The  left ventricle has normal function. The left ventricle has no regional  wall motion abnormalities. Left ventricular diastolic parameters are  consistent with Grade II diastolic  dysfunction (pseudonormalization). Elevated left ventricular end-diastolic  pressure.   2. Right ventricular systolic function is normal. The right ventricular  size is mildly enlarged. There is severely elevated pulmonary artery  systolic pressure. The estimated right ventricular systolic pressure is  68.9 mmHg.   3. Left atrial size was moderately dilated.   4. There is a mobile density on the right side of the interatrial septum  measuring 2.8 x 1.6 cm. Not seen on echo dated 01/2022. Recommend  cardiac  MRI for further evaluation. Right atrial size was mildly dilated.   5. The mitral valve is normal in structure. Mild mitral valve  regurgitation.   6. Tricuspid valve regurgitation is mild to moderate.   7. The aortic valve is tricuspid. Aortic valve regurgitation is trivial.  Aortic valve sclerosis is present, with no evidence of aortic valve  stenosis.   8. Aortic dilatation noted. There is mild  dilatation of the ascending  aorta, measuring 40 mm.   9. The inferior vena cava is dilated in size with <50% respiratory  variability, suggesting right atrial pressure of 15 mmHg.   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  Improvement in PA mean and PCWP with afterload reduction.  Normal cardiac output/index.  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  Past Medical History:  Diagnosis Date   Achilles tendonitis    R heal, limits activity   Allergy    Anxiety    Arthritis    Elevated hemoglobin A1c    GERD (gastroesophageal reflux disease)    Heart murmur    slight   Hyperlipidemia    Hypertension    pulmonary   IBS (irritable bowel syndrome)    Insomnia    Obesity    Palpitations    Pneumonia    as a child   SVT (supraventricular tachycardia) (HCC) 10/29/2013   Vitamin D deficiency     Past Surgical History:  Procedure Laterality Date   CESAREAN SECTION     CHOLECYSTECTOMY  10 years ago   RIGHT HEART CATH N/A 02/01/2023   Procedure: RIGHT HEART CATH;  Surgeon: Dorthula Nettles, DO;  Location: [facility];  Service: Cardiovascular;  Laterality: N/A;   TUBAL LIGATION Bilateral     MEDICATIONS:  Ascorbic Acid (VITAMIN C PO)   Cholecalciferol (VITAMIN D-3 PO)   CINNAMON PO   diclofenac Sodium (VOLTAREN) 1 % GEL   dicyclomine (BENTYL) 20 MG tablet   diphenhydrAMINE (BENADRYL) 25 mg capsule   ELIQUIS 5 MG TABS tablet   HOMEOPATHIC PRODUCTS EX   lidocaine (LIDODERM) 5 %   loperamide (IMODIUM A-D) 2 MG tablet   Menaquinone-7 (VITAMIN K2 PO)   metoprolol succinate (TOPROL-XL) 25 MG 24 hr tablet   Omega-3 Fatty Acids (FISH OIL) 1000 MG CAPS   ondansetron (ZOFRAN) 8 MG tablet   oxybutynin (DITROPAN-XL) 10 MG 24 hr tablet   pantoprazole (PROTONIX) 40 MG  tablet   pravastatin (PRAVACHOL) 20 MG tablet   valsartan-hydrochlorothiazide (DIOVAN HCT) 320-25 MG tablet   vitamin B-12 (CYANOCOBALAMIN) 500 MCG tablet   ZINC GLUCONATE PO   No current facility-administered medications for this encounter.     Jodell Cipro Ward, PA-C [facility] [phone]

## 2023-05-30 ENCOUNTER — Encounter (HOSPITAL_COMMUNITY): Payer: Self-pay | Admitting: Orthopaedic Surgery

## 2023-05-30 ENCOUNTER — Other Ambulatory Visit: Payer: Self-pay | Admitting: Orthopaedic Surgery

## 2023-06-02 NOTE — Progress Notes (Signed)
 Patient phoned to give updated information on surgery.  Surgery was rescheduled due to the physician being sick.    -Date of Surgery - 06-10-23  Arrival Time - 0715 and check in at admitting.    NPO Status - patient reminded to not eat solid food after midnight.  From midnight until 0625 AM may have clear liquids  Medications morning of surgery - Metoprolol, Ditropan, Pantoprazole, Pravastatin, Zofran if needed.    Patient will take last dose of Eliquis on 06-07-23  No change in medical history, allergies per patient.  Transportation home - [facility] [phone]  All questions answered and patient stated understanding

## 2023-06-09 MED ORDER — TRANEXAMIC ACID 1000 MG/10ML IV SOLN
2000.0000 mg | INTRAVENOUS | Status: DC
  Filled 2023-06-09: qty 20

## 2023-06-09 NOTE — H&P (Signed)
 Claudia [name] is an 73 y.o. female.   Chief Complaint: Right heel pain HPI: Claudia [name] is in with continued right heel pain and also bilateral knee pain.  She gets by with a brace on her heel but even that does not do the trick.  She has trouble walking very far and even resting.  She has been treated elsewhere with ultrasound therapy and lidocaine patches and prednisone.  She has tried many different shoes and the boot.  She wonders about a surgery we once discussed.    Radiographs:  X-rays that were ordered, performed, and interpreted by me included 2 views of the right heel.  She has a very prominent pump bump and a large Achilles insertional spur.  Past Medical History:  Diagnosis Date   Achilles tendonitis    R heal, limits activity   Allergy    Anxiety    Arthritis    Elevated hemoglobin A1c    GERD (gastroesophageal reflux disease)    Heart murmur    slight   Hyperlipidemia    Hypertension    pulmonary   IBS (irritable bowel syndrome)    Insomnia    Obesity    Palpitations    Pneumonia    as a child   SVT (supraventricular tachycardia) (HCC) 10/29/2013   Vitamin D deficiency     Past Surgical History:  Procedure Laterality Date   CESAREAN SECTION     CHOLECYSTECTOMY  10 years ago   RIGHT HEART CATH N/A 02/01/2023   Procedure: RIGHT HEART CATH;  Surgeon: Dorthula Nettles, DO;  Location: [facility];  Service: Cardiovascular;  Laterality: N/A;   TUBAL LIGATION Bilateral     Family History  Problem Relation Age of Onset   Heart attack Mother    Stroke Mother    Heart disease Mother    Diabetes Mother    Macular degeneration Mother    Heart attack Father    Alcohol abuse Father    Ovarian cancer Maternal Grandmother    Colon cancer Neg Hx    Social History:  reports that she quit smoking about 19 years ago. Her smoking use included cigarettes. She has never used smokeless tobacco. She reports current alcohol use. She reports that she does not use  drugs.  Allergies:  Allergies  Allergen Reactions   Amlodipine Shortness Of Breath   Ciprofloxacin Other (See Comments)    "Felt worse"   Levaquin [Levofloxacin In D5w]     No fluroquinolones due to ascending thoracic aortic aneurysm   Penicillins Other (See Comments)    Had as a child=unknown reaction   Zoloft [Sertraline Hcl] Other (See Comments)    "Felt bad"    No medications prior to admission.    No results found for this or any previous visit (from the past 48 hours). No results found.  Review of Systems  Musculoskeletal:  Positive for arthralgias.       Right heel  All other systems reviewed and are negative.   There were no vitals taken for this visit. Physical Exam Constitutional:      Appearance: Normal appearance.  HENT:     Head: Normocephalic and atraumatic.     Nose: Nose normal.     Mouth/Throat:     Pharynx: Oropharynx is clear.  Eyes:     Extraocular Movements: Extraocular movements intact.  Pulmonary:     Effort: Pulmonary effort is normal.  Abdominal:     Palpations: Abdomen is soft.  Musculoskeletal:  Cervical back: Normal range of motion.     Comments: Right heel has a significant prominence at the Achilles attachment.  This is very tender to palpation.  Her ankle motion is fairly good.  She has some pain through the retrocalcaneal bursa as well.    Skin:    General: Skin is warm and dry.  Neurological:     General: No focal deficit present.     Mental Status: She is alert and oriented to person, place, and time.  Psychiatric:        Mood and Affect: Mood normal.        Behavior: Behavior normal.        Thought Content: Thought content normal.        Judgment: Judgment normal.      Assessment/Plan Right Achilles insertional spur and pump bump  She  is interested in a heel operation.  I reviewed risk of anesthesia, infection, DVT related to removal of the Achilles insertional spurs and the pump bump.  I told her this involves  repair of her Achilles tendon and she would have to keep her weight off this side for a good month followed by another month in a boot. She is in agreement and would like to proceed.   Ginger Organ Mehran Guderian, PA-C 06/09/2023, 1:31 PM

## 2023-06-10 ENCOUNTER — Other Ambulatory Visit: Payer: Self-pay

## 2023-06-10 ENCOUNTER — Encounter (HOSPITAL_COMMUNITY)
Admission: RE | Disposition: A | Discharge status: Home / Self Care | Payer: Self-pay | Source: Ambulatory Visit | Attending: Orthopaedic Surgery

## 2023-06-10 ENCOUNTER — Ambulatory Visit (HOSPITAL_COMMUNITY): Payer: Self-pay | Admitting: Certified Registered Nurse Anesthetist

## 2023-06-10 ENCOUNTER — Ambulatory Visit (HOSPITAL_COMMUNITY): Payer: Self-pay | Admitting: Physician Assistant

## 2023-06-10 ENCOUNTER — Ambulatory Visit (HOSPITAL_COMMUNITY)
Admission: RE | Admit: 2023-06-10 | Discharge: 2023-06-10 | Disposition: A | Discharge status: Home / Self Care | Payer: Medicare Other | Source: Ambulatory Visit | Attending: Orthopaedic Surgery | Admitting: Orthopaedic Surgery

## 2023-06-10 ENCOUNTER — Encounter (HOSPITAL_COMMUNITY): Payer: Self-pay | Admitting: Orthopaedic Surgery

## 2023-06-10 DIAGNOSIS — M7731 Calcaneal spur, right foot: Secondary | ICD-10-CM | POA: Insufficient documentation

## 2023-06-10 DIAGNOSIS — Z87891 Personal history of nicotine dependence: Secondary | ICD-10-CM | POA: Insufficient documentation

## 2023-06-10 DIAGNOSIS — F419 Anxiety disorder, unspecified: Secondary | ICD-10-CM

## 2023-06-10 DIAGNOSIS — M216X1 Other acquired deformities of right foot: Secondary | ICD-10-CM | POA: Diagnosis not present

## 2023-06-10 DIAGNOSIS — I10 Essential (primary) hypertension: Secondary | ICD-10-CM | POA: Diagnosis not present

## 2023-06-10 DIAGNOSIS — G8929 Other chronic pain: Secondary | ICD-10-CM

## 2023-06-10 DIAGNOSIS — G8918 Other acute postprocedural pain: Secondary | ICD-10-CM | POA: Diagnosis not present

## 2023-06-10 DIAGNOSIS — M1990 Unspecified osteoarthritis, unspecified site: Secondary | ICD-10-CM | POA: Insufficient documentation

## 2023-06-10 DIAGNOSIS — I480 Paroxysmal atrial fibrillation: Secondary | ICD-10-CM

## 2023-06-10 DIAGNOSIS — M7661 Achilles tendinitis, right leg: Secondary | ICD-10-CM | POA: Diagnosis not present

## 2023-06-10 HISTORY — PX: ACHILLES TENDON SURGERY: SHX542

## 2023-06-10 SURGERY — REPAIR, TENDON, ACHILLES
Anesthesia: Regional | Laterality: Right

## 2023-06-10 MED ORDER — FENTANYL CITRATE PF 50 MCG/ML IJ SOSY
100.0000 ug | PREFILLED_SYRINGE | Freq: Once | INTRAMUSCULAR | Status: AC
  Administered 2023-06-10: 100 ug via INTRAVENOUS
  Filled 2023-06-10: qty 2

## 2023-06-10 MED ORDER — CHLORHEXIDINE GLUCONATE 0.12 % MT SOLN
15.0000 mL | Freq: Once | OROMUCOSAL | Status: AC
  Administered 2023-06-10: 15 mL via OROMUCOSAL

## 2023-06-10 MED ORDER — PHENYLEPHRINE HCL-NACL 20-0.9 MG/250ML-% IV SOLN
INTRAVENOUS | Status: AC
  Filled 2023-06-10: qty 250

## 2023-06-10 MED ORDER — AMISULPRIDE (ANTIEMETIC) 5 MG/2ML IV SOLN: 10.0000 mg | Freq: Once | INTRAVENOUS | Status: DC | PRN

## 2023-06-10 MED ORDER — ONDANSETRON HCL 4 MG/2ML IJ SOLN: 4.0000 mg | Freq: Once | INTRAMUSCULAR | Status: DC | PRN

## 2023-06-10 MED ORDER — ORAL CARE MOUTH RINSE: 15.0000 mL | Freq: Once | OROMUCOSAL | Status: AC

## 2023-06-10 MED ORDER — CEFAZOLIN SODIUM-DEXTROSE 2-4 GM/100ML-% IV SOLN
2.0000 g | Freq: Once | INTRAVENOUS | Status: AC
  Administered 2023-06-10: 2 g via INTRAVENOUS
  Filled 2023-06-10: qty 100

## 2023-06-10 MED ORDER — DEXAMETHASONE SODIUM PHOSPHATE 10 MG/ML IJ SOLN
INTRAMUSCULAR | Status: AC
  Filled 2023-06-10: qty 1

## 2023-06-10 MED ORDER — BUPIVACAINE HCL (PF) 0.5 % IJ SOLN
INTRAMUSCULAR | Status: AC
  Filled 2023-06-10: qty 30

## 2023-06-10 MED ORDER — PROPOFOL 10 MG/ML IV BOLUS
INTRAVENOUS | Status: AC
  Filled 2023-06-10: qty 20

## 2023-06-10 MED ORDER — SUGAMMADEX SODIUM 200 MG/2ML IV SOLN
INTRAVENOUS | Status: DC | PRN
Start: 2023-06-10 — End: 2023-06-10
  Administered 2023-06-10: 100 mg via INTRAVENOUS

## 2023-06-10 MED ORDER — ACETAMINOPHEN 500 MG PO TABS
1000.0000 mg | ORAL_TABLET | Freq: Once | ORAL | Status: AC
  Administered 2023-06-10: 1000 mg via ORAL
  Filled 2023-06-10: qty 2

## 2023-06-10 MED ORDER — LACTATED RINGERS IV SOLN: INTRAVENOUS | Status: DC

## 2023-06-10 MED ORDER — MIDAZOLAM HCL 2 MG/2ML IJ SOLN
2.0000 mg | Freq: Once | INTRAMUSCULAR | Status: AC
  Administered 2023-06-10: 2 mg via INTRAVENOUS
  Filled 2023-06-10: qty 2

## 2023-06-10 MED ORDER — PROPOFOL 10 MG/ML IV BOLUS
INTRAVENOUS | Status: DC | PRN
  Administered 2023-06-10: 150 mg via INTRAVENOUS

## 2023-06-10 MED ORDER — ROPIVACAINE HCL 5 MG/ML IJ SOLN
INTRAMUSCULAR | Status: DC | PRN
  Administered 2023-06-10: 30 mL via PERINEURAL

## 2023-06-10 MED ORDER — LIDOCAINE HCL (PF) 2 % IJ SOLN
INTRAMUSCULAR | Status: AC
  Filled 2023-06-10: qty 5

## 2023-06-10 MED ORDER — ONDANSETRON HCL 4 MG/2ML IJ SOLN
INTRAMUSCULAR | Status: DC | PRN
  Administered 2023-06-10: 4 mg via INTRAVENOUS

## 2023-06-10 MED ORDER — OXYCODONE HCL 5 MG PO TABS: 5.0000 mg | ORAL_TABLET | Freq: Once | ORAL | Status: DC | PRN

## 2023-06-10 MED ORDER — ONDANSETRON HCL 4 MG/2ML IJ SOLN
INTRAMUSCULAR | Status: AC
  Filled 2023-06-10: qty 2

## 2023-06-10 MED ORDER — ARTIFICIAL TEARS OPHTHALMIC OINT
TOPICAL_OINTMENT | OPHTHALMIC | Status: AC
  Filled 2023-06-10: qty 3.5

## 2023-06-10 MED ORDER — TRANEXAMIC ACID-NACL 1000-0.7 MG/100ML-% IV SOLN
1000.0000 mg | INTRAVENOUS | Status: AC
  Administered 2023-06-10: 1000 mg via INTRAVENOUS
  Filled 2023-06-10: qty 100

## 2023-06-10 MED ORDER — OXYCODONE HCL 5 MG/5ML PO SOLN: 5.0000 mg | Freq: Once | ORAL | Status: DC | PRN

## 2023-06-10 MED ORDER — SUGAMMADEX SODIUM 200 MG/2ML IV SOLN
INTRAVENOUS | Status: AC
  Filled 2023-06-10: qty 2

## 2023-06-10 MED ORDER — DEXAMETHASONE SODIUM PHOSPHATE 10 MG/ML IJ SOLN
INTRAMUSCULAR | Status: DC | PRN
  Administered 2023-06-10: 8 mg via INTRAVENOUS
  Administered 2023-06-10: 10 mg via INTRAVENOUS

## 2023-06-10 MED ORDER — ROCURONIUM BROMIDE 10 MG/ML (PF) SYRINGE
PREFILLED_SYRINGE | INTRAVENOUS | Status: AC
  Filled 2023-06-10: qty 10

## 2023-06-10 MED ORDER — POVIDONE-IODINE 10 % EX SWAB: 2.0000 | Freq: Once | CUTANEOUS | Status: DC

## 2023-06-10 MED ORDER — EPHEDRINE SULFATE (PRESSORS) 50 MG/ML IJ SOLN
INTRAMUSCULAR | Status: DC | PRN
  Administered 2023-06-10 (×3): 10 mg via INTRAVENOUS

## 2023-06-10 MED ORDER — HYDROMORPHONE HCL 1 MG/ML IJ SOLN: 0.2500 mg | INTRAMUSCULAR | Status: DC | PRN

## 2023-06-10 MED ORDER — VANCOMYCIN HCL IN DEXTROSE 1-5 GM/200ML-% IV SOLN: 1000.0000 mg | Freq: Once | INTRAVENOUS | Status: DC

## 2023-06-10 MED ORDER — LIDOCAINE HCL (PF) 2 % IJ SOLN
INTRAMUSCULAR | Status: DC | PRN
  Administered 2023-06-10: 40 mg via INTRADERMAL

## 2023-06-10 MED ORDER — ROCURONIUM BROMIDE 100 MG/10ML IV SOLN
INTRAVENOUS | Status: DC | PRN
  Administered 2023-06-10: 60 mg via INTRAVENOUS

## 2023-06-10 MED ORDER — LACTATED RINGERS IV SOLN
INTRAVENOUS | Status: DC
Start: 2023-06-10 — End: 2023-06-10

## 2023-06-10 SURGICAL SUPPLY — 55 items
ANCHOR SPDBRG KL ACHILLES 3.9 (Anchor) IMPLANT
BANDAGE ESMARK 6X9 LF (GAUZE/BANDAGES/DRESSINGS) ×2 IMPLANT
BLADE AVERAGE 25X9 (BLADE) IMPLANT
BLADE SURG 15 STRL LF DISP TIS (BLADE) ×4 IMPLANT
BNDG ELASTIC 4INX 5YD STR LF (GAUZE/BANDAGES/DRESSINGS) ×2 IMPLANT
BNDG ELASTIC 4X5.8 VLCR NS LF (GAUZE/BANDAGES/DRESSINGS) IMPLANT
BNDG ELASTIC 6INX 5YD STR LF (GAUZE/BANDAGES/DRESSINGS) ×2 IMPLANT
BNDG ELASTIC 6X10 VLCR STRL LF (GAUZE/BANDAGES/DRESSINGS) IMPLANT
BNDG ESMARK 6X9 LF (GAUZE/BANDAGES/DRESSINGS) ×1 IMPLANT
BNDG GAUZE DERMACEA FLUFF 4 (GAUZE/BANDAGES/DRESSINGS) ×2 IMPLANT
COVER BACK TABLE 60X90IN (DRAPES) ×2 IMPLANT
COVER MAYO STAND STRL (DRAPES) ×2 IMPLANT
CUFF TRNQT CYL 34X4.125X (TOURNIQUET CUFF) IMPLANT
DRAPE EXTREMITY T 121X128X90 (DISPOSABLE) ×2 IMPLANT
DRAPE U-SHAPE 47X51 STRL (DRAPES) ×2 IMPLANT
DRAPE U-SHAPE 76X120 STRL (DRAPES) ×2 IMPLANT
DRSG EMULSION OIL 3X3 NADH (GAUZE/BANDAGES/DRESSINGS) IMPLANT
DURAPREP 26ML APPLICATOR (WOUND CARE) ×2 IMPLANT
ELECT REM PT RETURN 15FT ADLT (MISCELLANEOUS) ×2 IMPLANT
GAUZE 4X4 16PLY ~~LOC~~+RFID DBL (SPONGE) IMPLANT
GAUZE SPONGE 4X4 12PLY STRL (GAUZE/BANDAGES/DRESSINGS) ×2 IMPLANT
GLOVE BIO SURGEON STRL SZ8 (GLOVE) ×4 IMPLANT
GLOVE BIOGEL PI IND STRL 8 (GLOVE) ×4 IMPLANT
GOWN STRL REUS W/ TWL LRG LVL3 (GOWN DISPOSABLE) ×2 IMPLANT
GOWN STRL REUS W/ TWL XL LVL3 (GOWN DISPOSABLE) ×2 IMPLANT
KIT BASIN OR (CUSTOM PROCEDURE TRAY) ×2 IMPLANT
KIT TURNOVER KIT A (KITS) IMPLANT
NDL 1/2 CIR CATGUT .05X1.09 (NEEDLE) IMPLANT
NDL HYPO 22X1.5 SAFETY MO (MISCELLANEOUS) IMPLANT
NEEDLE 1/2 CIR CATGUT .05X1.09 (NEEDLE) IMPLANT
NEEDLE HYPO 22X1.5 SAFETY MO (MISCELLANEOUS) IMPLANT
NS IRRIG 1000ML POUR BTL (IV SOLUTION) ×2 IMPLANT
PAD CAST 4YDX4 CTTN HI CHSV (CAST SUPPLIES) ×2 IMPLANT
PAD CAST CTTN 4X4 STRL (SOFTGOODS) IMPLANT
PADDING CAST COTTON 6X4 STRL (CAST SUPPLIES) IMPLANT
PEEK SWIVELOCK SHOU 3.9 (Orthopedic Implant) IMPLANT
PENCIL SMOKE EVACUATOR (MISCELLANEOUS) ×2 IMPLANT
SPIKE FLUID TRANSFER (MISCELLANEOUS) IMPLANT
SPLINT PLASTER CAST FAST 5X30 (CAST SUPPLIES) IMPLANT
SPLINT PLASTER CAST XFAST 5X30 (CAST SUPPLIES) ×10 IMPLANT
STAPLER SKIN PROX WIDE 3.9 (STAPLE) IMPLANT
STOCKINETTE 4X48 STRL (DRAPES) ×2 IMPLANT
STRIP CLOSURE SKIN 1/2X4 (GAUZE/BANDAGES/DRESSINGS) IMPLANT
SUT FIBERWIRE #2 38 T-5 BLUE (SUTURE) IMPLANT
SUT MNCRL AB 4-0 PS2 18 (SUTURE) IMPLANT
SUT PROLENE 3 0 PS 2 (SUTURE) IMPLANT
SUT PROLENE 4 0 PS 2 18 (SUTURE) IMPLANT
SUT VIC AB 0 CT1 27XBRD ANBCTR (SUTURE) IMPLANT
SUT VIC AB 2-0 CT1 TAPERPNT 27 (SUTURE) IMPLANT
SUT VIC AB 2-0 SH 27XBRD (SUTURE) IMPLANT
SUT VIC AB 3-0 FS2 27 (SUTURE) IMPLANT
SUTURE FIBERWR #2 38 T-5 BLUE (SUTURE) IMPLANT
SYR BULB EAR ULCER 3OZ GRN STR (SYRINGE) ×2 IMPLANT
SYR CONTROL 10ML LL (SYRINGE) IMPLANT
UNDERPAD 30X36 HEAVY ABSORB (UNDERPADS AND DIAPERS) ×2 IMPLANT

## 2023-06-10 NOTE — Interval H&P Note (Signed)
 History and Physical Interval Note:  06/10/2023 9:21 AM  Claudia [name]  has presented today for surgery, with the diagnosis of RIGHT HEEL PUMP BUMP AND ACHILLES SPUR.  The various methods of treatment have been discussed with the patient and family. After consideration of risks, benefits and other options for treatment, the patient has consented to  Procedure(s): RIGHT HEEL ACHILLES TENDON REPAIR AND SPUR EXCISION (Right) as a surgical intervention.  The patient's history has been reviewed, patient examined, no change in status, stable for surgery.  I have reviewed the patient's chart and labs.  Questions were answered to the patient's satisfaction.     Velna Ochs

## 2023-06-10 NOTE — Anesthesia Procedure Notes (Signed)
 Anesthesia Regional Block: Popliteal block   Pre-Anesthetic Checklist: , timeout performed,  Correct Patient, Correct Site, Correct Laterality,  Correct Procedure, Correct Position, site marked,  Risks and benefits discussed,  Surgical consent,  Pre-op evaluation,  At surgeon's request and post-op pain management  Laterality: Right  Prep: Maximum Sterile Barrier Precautions used, chloraprep       Needles:  Injection technique: Single-shot  Needle Type: Echogenic Stimulator Needle     Needle Length: 9cm  Needle Gauge: 22     Additional Needles:   Procedures:,,,, ultrasound used (permanent image in chart),,    Narrative:  Start time: 06/10/2023 8:25 AM End time: 06/10/2023 8:30 AM Injection made incrementally with aspirations every 5 mL.  Performed by: Personally  Anesthesiologist: Lannie Fields, DO  Additional Notes: Monitors applied. No increased pain on injection. No increased resistance to injection. Injection made in 5cc increments. Good needle visualization. Patient tolerated procedure well.

## 2023-06-10 NOTE — Progress Notes (Signed)
 Orthopedic Tech Progress Note Patient Details:  Claudia [name] May 09, 1950 [id number]  Ortho Devices Type of Ortho Device: Crutches Ortho Device/Splint Interventions: Adjustment   Post Interventions Patient Tolerated: Ambulated well  Eliberto Ivory E Kerrigan Gombos 06/10/2023, 6:09 PM

## 2023-06-10 NOTE — Anesthesia Procedure Notes (Signed)
 Procedure Name: Intubation Date/Time: 06/10/2023 10:13 AM  Performed by: Ovidio Kin, CRNAPre-anesthesia Checklist: Patient identified, Emergency Drugs available, Suction available and Patient being monitored Patient Re-evaluated:Patient Re-evaluated prior to induction Preoxygenation: Pre-oxygenation with 100% oxygen Induction Type: IV induction Ventilation: Mask ventilation without difficulty Laryngoscope Size: Mac and 4 Grade View: Grade I Tube type: Oral Tube size: 7.0 mm Number of attempts: 1 Airway Equipment and Method: Stylet Placement Confirmation: ETT inserted through vocal cords under direct vision, positive ETCO2, CO2 detector and breath sounds checked- equal and bilateral Secured at: 21 cm Tube secured with: Tape Dental Injury: Teeth and Oropharynx as per pre-operative assessment

## 2023-06-10 NOTE — Transfer of Care (Signed)
 Immediate Anesthesia Transfer of Care Note  Patient: LAYKEN BEG  Procedure(s) Performed: RIGHT HEEL ACHILLES TENDON REPAIR AND SPUR EXCISION (Right)  Patient Location: PACU  Anesthesia Type:General  Level of Consciousness: awake, alert , and oriented  Airway & Oxygen Therapy: Patient Spontanous Breathing and Patient connected to face mask oxygen  Post-op Assessment: Report given to RN and Post -op Vital signs reviewed and stable  Post vital signs: Reviewed and stable  Last Vitals:  Vitals Value Taken Time  BP 142/64 06/10/23 1146  Temp    Pulse 71 06/10/23 1149  Resp 24 06/10/23 1149  SpO2 100 % 06/10/23 1149  Vitals shown include unfiled device data.  Last Pain:  Vitals:   06/10/23 0921  TempSrc:   PainSc: Asleep         Complications: No notable events documented.

## 2023-06-10 NOTE — Brief Op Note (Signed)
.  Claudia [name] [id number] 06/10/2023   PRE-OP DIAGNOSIS: right achilles spurs  POST-OP DIAGNOSIS: same  PROCEDURE: right achilles spur excision and repair  ANESTHESIA: general and block  Velna Ochs   Dictation #:  [id number]

## 2023-06-10 NOTE — Anesthesia Postprocedure Evaluation (Signed)
 Anesthesia Post Note  Patient: Claudia [name]  Procedure(s) Performed: RIGHT HEEL ACHILLES TENDON REPAIR AND SPUR EXCISION (Right)     Patient location during evaluation: PACU Anesthesia Type: Regional and General Level of consciousness: awake and alert, oriented and patient cooperative Pain management: pain level controlled Vital Signs Assessment: post-procedure vital signs reviewed and stable Respiratory status: spontaneous breathing, nonlabored ventilation and respiratory function stable Cardiovascular status: blood pressure returned to baseline and stable Postop Assessment: no apparent nausea or vomiting Anesthetic complications: no   No notable events documented.  Last Vitals:  Vitals:   06/10/23 1247 06/10/23 1315  BP: (!) 140/85 135/69  Pulse: 72 (!) 52  Resp: 20   Temp: 36.6 C   SpO2: 98% 100%    Last Pain:  Vitals:   06/10/23 1247  TempSrc: Oral  PainSc: 0-No pain                 Lannie Fields

## 2023-06-13 ENCOUNTER — Encounter (HOSPITAL_COMMUNITY): Payer: Self-pay | Admitting: Orthopaedic Surgery

## 2023-06-13 NOTE — Op Note (Signed)
 Claudia [name], [name] MEDICAL RECORD NO: [id number] ACCOUNT NO: [id number] DATE OF BIRTH: July 16, 1950 FACILITY: [facility] LOCATION: [facility] PHYSICIAN: Lubertha Basque. Jerl Santos, MD  Operative Report   DATE OF PROCEDURE: 06/10/2023  PREOPERATIVE DIAGNOSES: 1. Right heel Achilles insertional spur. 2. Right heel pump bump.  POSTOPERATIVE DIAGNOSES: 1. Right heel Achilles insertional spur. 2. Right heel pump bump.  PROCEDURE PERFORMED: 1. Right Achilles spur excision. 2. Right Achilles tendon repair.  ANESTHESIA: General en bloc.  SURGEON:  Lubertha Basque. Jerl Santos, MD  ASSISTANT: Elodia Florence, PA  INDICATIONS: The patient is a 73 year old woman with a long history of right heel pain. This has persisted despite shoe modifications and pads and rest. By x-ray, she has two very prominent spurs at her Achilles attachment. This makes shoe wear difficult  and walking difficult. She has offered excision of the spurs and repair of the Achilles tendon.  Informed operative consent was obtained after discussion of possible complications including reaction to anesthesia, Achilles rupture, and skin healing problems.  SUMMARY FINDINGS AND PROCEDURE: Under general anesthesia and a block through a posteromedial longitudinal incision, a pump bump was removed along with an Achilles insertional spur. This necessitated attachment of about 60% of the Achilles attachment and  this was repaired with the [employer] SutureBridge system. I used fluoroscopy throughout the case to make appropriate intraoperative decisions and read these views myself. Elodia Florence assisted throughout and was invaluable to the completion of the case and  that he helped retract to make the procedure possible and closed simultaneously to help minimize OR time.  PROCEDURE: The patient was taken to the operating suite where general anesthetic was applied without difficulty. She was also given a block in the pre-anesthesia area. She was positioned prone  with chest rolls and all bony prominences appropriately  padded. She was then prepped and draped in normal sterile fashion. After the administration of preop IV Kefzol, and an appropriate time-out, the right leg was elevated, exsanguinated, and a tourniquet inflated about the calf. I made a posteromedial  longitudinal incision at the Achilles attachment with dissection down through the Achilles. I split this longitudinally midline and took the dissection distally to the large insertional spur. I detached about 60% of the Achilles attachment to remove this  large spur, which was confirmed removed under fluoroscopic guidance. I then used the saw and went through the split in the Achilles to remove the pump bump. Again, this was confirmed to be resected by fluoroscopy. We then prepared a bleeding bed of bone  for the Achilles repair. I placed the two medial row sutures and passed the arms of these sutures through the Achilles. I then placed the distal roll and crossed the aforementioned sutures dipping them into the distal row anchor holes. This gave [place] a  nice repair. I then performed a side-to-side repair with some of the remaining suture. The wound was irrigated followed by reapproximation of deep tissues with Vicryl. The tourniquet was deflated and a small amount of bleeding was easily controlled with  some pressure. Her foot became pink and warm immediately. We then reapproximated the skin with nylon. Steri-Strips were placed followed by dry gauze and a posterior splint and plaster with the ankle in slight plantar flexion. Intraoperative fluids and  estimated blood loss can be obtained from the anesthesia records, as can accurate tourniquet time.  DISPOSITION: The patient was extubated in the operating room and taken to the recovery room in stable condition.  Plans were for her to go home  the same day and follow up in the office in 1 week. I will contact her by phone tonight.     Xaver.Mink D: 06/10/2023  11:24:46 am T: 06/10/2023 11:29:00 pm  JOB: [id number]/ [id number]

## 2023-06-20 DIAGNOSIS — M79671 Pain in right foot: Secondary | ICD-10-CM | POA: Diagnosis not present

## 2023-07-08 ENCOUNTER — Ambulatory Visit: Payer: Medicare Other | Admitting: Family Medicine

## 2023-07-15 ENCOUNTER — Other Ambulatory Visit: Payer: Self-pay | Admitting: Cardiovascular Disease

## 2023-08-02 ENCOUNTER — Encounter (HOSPITAL_COMMUNITY): Payer: Self-pay

## 2023-08-03 ENCOUNTER — Ambulatory Visit (HOSPITAL_COMMUNITY): Payer: Medicare Other

## 2023-08-12 DIAGNOSIS — S86001D Unspecified injury of right Achilles tendon, subsequent encounter: Secondary | ICD-10-CM | POA: Diagnosis not present

## 2023-08-16 DIAGNOSIS — S86001D Unspecified injury of right Achilles tendon, subsequent encounter: Secondary | ICD-10-CM | POA: Diagnosis not present

## 2023-08-18 ENCOUNTER — Encounter: Payer: TRICARE For Life (TFL) | Admitting: Internal Medicine

## 2023-08-19 DIAGNOSIS — S86001D Unspecified injury of right Achilles tendon, subsequent encounter: Secondary | ICD-10-CM | POA: Diagnosis not present

## 2023-08-25 ENCOUNTER — Ambulatory Visit (INDEPENDENT_AMBULATORY_CARE_PROVIDER_SITE_OTHER): Admitting: Family Medicine

## 2023-08-25 ENCOUNTER — Encounter: Payer: Self-pay | Admitting: Family Medicine

## 2023-08-25 VITALS — BP 132/66 | HR 50 | Temp 97.5°F | Resp 16 | Ht 67.0 in | Wt 224.1 lb

## 2023-08-25 DIAGNOSIS — I7121 Aneurysm of the ascending aorta, without rupture: Secondary | ICD-10-CM

## 2023-08-25 DIAGNOSIS — R7309 Other abnormal glucose: Secondary | ICD-10-CM | POA: Diagnosis not present

## 2023-08-25 DIAGNOSIS — I10 Essential (primary) hypertension: Secondary | ICD-10-CM

## 2023-08-25 DIAGNOSIS — E782 Mixed hyperlipidemia: Secondary | ICD-10-CM | POA: Diagnosis not present

## 2023-08-25 DIAGNOSIS — I272 Pulmonary hypertension, unspecified: Secondary | ICD-10-CM | POA: Diagnosis not present

## 2023-08-25 DIAGNOSIS — D649 Anemia, unspecified: Secondary | ICD-10-CM | POA: Diagnosis not present

## 2023-08-25 DIAGNOSIS — K219 Gastro-esophageal reflux disease without esophagitis: Secondary | ICD-10-CM | POA: Diagnosis not present

## 2023-08-25 DIAGNOSIS — Z79899 Other long term (current) drug therapy: Secondary | ICD-10-CM

## 2023-08-25 LAB — CBC WITH DIFFERENTIAL/PLATELET
Basophils Absolute: 0.1 10*3/uL (ref 0.0–0.1)
Basophils Relative: 0.9 % (ref 0.0–3.0)
Eosinophils Absolute: 0.1 10*3/uL (ref 0.0–0.7)
Eosinophils Relative: 2.1 % (ref 0.0–5.0)
HCT: 36.5 % (ref 36.0–46.0)
Hemoglobin: 12 g/dL (ref 12.0–15.0)
Lymphocytes Relative: 29.3 % (ref 12.0–46.0)
Lymphs Abs: 1.6 10*3/uL (ref 0.7–4.0)
MCHC: 32.9 g/dL (ref 30.0–36.0)
MCV: 84.4 fl (ref 78.0–100.0)
Monocytes Absolute: 0.7 10*3/uL (ref 0.1–1.0)
Monocytes Relative: 12.4 % — ABNORMAL HIGH (ref 3.0–12.0)
Neutro Abs: 2.9 10*3/uL (ref 1.4–7.7)
Neutrophils Relative %: 55.3 % (ref 43.0–77.0)
Platelets: 243 10*3/uL (ref 150.0–400.0)
RBC: 4.32 Mil/uL (ref 3.87–5.11)
RDW: 13.5 % (ref 11.5–15.5)
WBC: 5.3 10*3/uL (ref 4.0–10.5)

## 2023-08-25 LAB — COMPREHENSIVE METABOLIC PANEL WITH GFR
ALT: 11 U/L (ref 0–35)
AST: 16 U/L (ref 0–37)
Albumin: 4.1 g/dL (ref 3.5–5.2)
Alkaline Phosphatase: 72 U/L (ref 39–117)
BUN: 19 mg/dL (ref 6–23)
CO2: 30 meq/L (ref 19–32)
Calcium: 9.1 mg/dL (ref 8.4–10.5)
Chloride: 99 meq/L (ref 96–112)
Creatinine, Ser: 0.7 mg/dL (ref 0.40–1.20)
GFR: 87.35 mL/min (ref >60.00)
Glucose, Bld: 94 mg/dL (ref 70–99)
Potassium: 4.5 meq/L (ref 3.5–5.1)
Sodium: 134 meq/L — ABNORMAL LOW (ref 135–145)
Total Bilirubin: 0.6 mg/dL (ref 0.2–1.2)
Total Protein: 7.2 g/dL (ref 6.0–8.3)

## 2023-08-25 LAB — LIPID PANEL
Cholesterol: 125 mg/dL (ref 0–200)
HDL: 53 mg/dL (ref >39.00)
LDL Cholesterol: 54 mg/dL (ref 0–99)
NonHDL: 72.29
Total CHOL/HDL Ratio: 2
Triglycerides: 93 mg/dL (ref 0.0–149.0)
VLDL: 18.6 mg/dL (ref 0.0–40.0)

## 2023-08-25 LAB — VITAMIN B12: Vitamin B-12: 1088 pg/mL — ABNORMAL HIGH (ref 211–911)

## 2023-08-25 LAB — IBC + FERRITIN
Ferritin: 9.6 ng/mL — ABNORMAL LOW (ref 10.0–291.0)
Iron: 76 ug/dL (ref 42–145)
Saturation Ratios: 19 % — ABNORMAL LOW (ref 20.0–50.0)
TIBC: 399 ug/dL (ref 250.0–450.0)
Transferrin: 285 mg/dL (ref 212.0–360.0)

## 2023-08-25 LAB — TSH: TSH: 1.7 u[IU]/mL (ref 0.35–5.50)

## 2023-08-25 LAB — HEMOGLOBIN A1C: Hgb A1c MFr Bld: 5.7 % (ref 4.6–6.5)

## 2023-08-25 NOTE — Progress Notes (Signed)
 New Patient Office Visit  Subjective:  Patient ID: Claudia [name], female    DOB: 06-06-1950  Age: 73 y.o. MRN: [id number]  CC:  Chief Complaint  Patient presents with   Establish Care    Initial visit to establish care with new pcp No food, decaf coffee with creamer    HPI LEVY WELLMAN presents for new pt Dr. Cassondra Cliff Discussed the use of AI scribe software for clinical note transcription with the patient, who gave verbal consent to proceed.  History of Present Illness Claudia [name] "Claudia [name]" is a 73 year old female with pulmonary hypertension and aortic aneurysm who presents for a new patient visit and medication review.  She has a history of pulmonary hypertension, confirmed as a mild case following a heart catheterization in October. She is scheduled for a heart MRI but has not yet arranged it. No blockages in her heart have been reported, but she has an aortic aneurysm in the ascending thoracic region, which is being monitored. She is currently taking metoprolol  to manage her heart rate and valsartan  HCT for blood pressure control.  She has a history of supraventricular tachycardia (SVT) and is taking Eliquis  as a blood thinner, although she has no personal history of clots or embolisms. Her mother had a stroke, which may influence her medication regimen.  She experiences symptoms of gastroesophageal reflux disease (GERD), including indigestion and heartburn, for which she takes pantoprazole  daily. No difficulty swallowing or food getting stuck. No bleeding  She has osteoarthritis with no cartilage in either knee, limiting her physical activity. She recently had surgery in March to remove bone spurs from her right heel and is still undergoing physical therapy. She reports tingling and numbness in her foot post-surgery but is beginning to feel capable of taking short walks. She is not as active as she would like to be and spends a lot of time sitting and working on her  computer.  She has a history of osteoporosis and is taking vitamin D  supplements. Her vitamin D  level in October was 79, and she is currently taking 8000 IU of vitamin D  daily.   She also has a history of prediabetes, with an A1c of 5.9 in October, and is mindful of her diet to manage this condition.  She reports thinning hair, which she attributes to genetic hair loss-consindering a supplement.  No current chest pain, heart racing, coughing, shortness of breath, dizziness, lightheadedness, vomiting, diarrhea, constipation, blood in stools, or feelings of depression.      Current Outpatient Medications:    Ascorbic Acid (VITAMIN C PO), Take 500 mg by mouth 2 (two) times daily., Disp: , Rfl:    Cholecalciferol (VITAMIN D -3 PO), Take 4,000 Units by mouth in the morning and at bedtime., Disp: , Rfl:    CINNAMON PO, Take 1,000 mg by mouth at bedtime., Disp: , Rfl:    diclofenac  Sodium (VOLTAREN ) 1 % GEL, Apply 4 g topically 4 (four) times daily as needed (pain)., Disp: , Rfl:    dicyclomine  (BENTYL ) 20 MG tablet, Take  1/2 to 1 tablet   4 x /day  before meals & Bedtime for Nausea, Cramping, Bloating or Diarrhea (Patient taking differently: Take 20 mg by mouth 4 (four) times daily as needed for spasms.), Disp: 60 tablet, Rfl: 1   diphenhydrAMINE (BENADRYL) 25 mg capsule, Take 25 mg by mouth at bedtime., Disp: , Rfl:    ELIQUIS  5 MG TABS tablet, TAKE 1 TABLET TWICE A DAY, Disp: 180  tablet, Rfl: 3   HOMEOPATHIC PRODUCTS EX, Place 1 patch onto the skin daily as needed (knee pain). Natural Knee Pain Patch-Herbal Knee Patches (Peppermint/Capsicum/Camphor/Ginger/Safflower/[place] extracts) Applied knee/heel., Disp: , Rfl:    lidocaine  (LIDODERM ) 5 %, Place 1 patch onto the skin daily as needed (pain.)., Disp: , Rfl:    loperamide (IMODIUM A-D) 2 MG tablet, Take 6 mg by mouth in the morning., Disp: , Rfl:    Menaquinone-7 (VITAMIN K2 PO), Take 100 mcg by mouth in the morning., Disp: , Rfl:    metoprolol   succinate (TOPROL -XL) 25 MG 24 hr tablet, TAKE ONE-HALF (1/2) TABLET DAILY, Disp: 45 tablet, Rfl: 3   Omega-3 Fatty Acids (FISH OIL) 1000 MG CAPS, Take 1,000 mg by mouth at bedtime., Disp: , Rfl:    ondansetron  (ZOFRAN ) 8 MG tablet, Take 1/2 to 1 tablet 3 x / day (every 6 hours) as needed for nausea / Vomitting, Disp: 90 tablet, Rfl: 0   oxybutynin  (DITROPAN -XL) 10 MG 24 hr tablet, TAKE 1 TABLET AT SUPPERTIME FOR OVERACTIVE BLADDER, Disp: 90 tablet, Rfl: 3   pantoprazole  (PROTONIX ) 40 MG tablet, Take  1 tablet  Daily  to Prevent Heartburn &  Indigestion, Disp: 90 tablet, Rfl: 3   pravastatin  (PRAVACHOL ) 20 MG tablet, TAKE 1 TABLET DAILY, Disp: 90 tablet, Rfl: 3   valsartan -hydrochlorothiazide  (DIOVAN  HCT) 320-25 MG tablet, Take 1 tablet by mouth daily., Disp: 90 tablet, Rfl: 3   vitamin B-12 (CYANOCOBALAMIN) 500 MCG tablet, Take 500 mcg by mouth in the morning., Disp: , Rfl:    ZINC GLUCONATE PO, Take 25 mg by mouth at bedtime., Disp: , Rfl:   Past Medical History:  Diagnosis Date   Achilles tendonitis    R heal, limits activity   Allergy    Anxiety    Arthritis    Elevated hemoglobin A1c    GERD (gastroesophageal reflux disease)    Heart murmur    slight   Hyperlipidemia    Hypertension    pulmonary   IBS (irritable bowel syndrome)    Insomnia    Obesity    Osteoporosis 5 Years ago?   Palpitations    Pneumonia    as a child   Pulmonary HTN (HCC)    SVT (supraventricular tachycardia) (HCC) 10/29/2013   Thoracic aortic aneurysm without rupture (HCC)    Vitamin D  deficiency     Past Surgical History:  Procedure Laterality Date   ACHILLES TENDON SURGERY Right 06/10/2023   Procedure: RIGHT HEEL ACHILLES TENDON REPAIR AND SPUR EXCISION;  Surgeon: Dayne Even, MD;  Location: [facility];  Service: Orthopedics;  Laterality: Right;   CESAREAN SECTION     CHOLECYSTECTOMY  10 years ago   RIGHT HEART CATH N/A 02/01/2023   Procedure: RIGHT HEART CATH;  Surgeon: Alwin Baars, DO;   Location: [facility];  Service: Cardiovascular;  Laterality: N/A;   TUBAL LIGATION Bilateral     Family History  Problem Relation Age of Onset   Heart attack Mother    Stroke Mother    Heart disease Mother    Diabetes Mother    Macular degeneration Mother    Arthritis Mother    Vision loss Mother    Varicose Veins Mother    Heart attack Father    Alcohol abuse Father    Ovarian cancer Maternal Grandmother    Colon cancer Neg Hx     Social History   Socioeconomic History   Marital status: Married    Spouse name: Not  on file   Number of children: 1   Years of education: Not on file   Highest education level: Some college, no degree  Occupational History   Not on file  Tobacco Use   Smoking status: Former    Current packs/day: 0.00    Types: Cigarettes    Quit date: 04/01/2004    Years since quitting: 19.4   Smokeless tobacco: Never   Tobacco comments:    Social smoker times 10 years  Vaping Use   Vaping status: Never Used  Substance and Sexual Activity   Alcohol use: Not Currently    Comment: rarely, < 1 beer per week   Drug use: No   Sexual activity: Yes    Birth control/protection: Surgical  Other Topics Concern   Not on file  Social History Narrative   Pt lives in [place] with spouse.  Retired from [employer].   [employer]   Social Drivers of Corporate investment banker Strain: Low Risk  (08/24/2023)   Overall Financial Resource Strain (CARDIA)    Difficulty of Paying Living Expenses: Not hard at all  Food Insecurity: No Food Insecurity (08/24/2023)   Hunger Vital Sign    Worried About Running Out of Food in the Last Year: Never true    Ran Out of Food in the Last Year: Never true  Transportation Needs: No Transportation Needs (08/24/2023)   PRAPARE - Administrator, Civil Service (Medical): No    Lack of Transportation (Non-Medical): No  Physical Activity: Unknown (08/24/2023)   Exercise Vital Sign    Days of Exercise per  Week: 0 days    Minutes of Exercise per Session: Not on file  Stress: No Stress Concern Present (08/24/2023)   [employer] of Occupational Health - Occupational Stress Questionnaire    Feeling of Stress : Not at all  Social Connections: Socially Integrated (08/24/2023)   Social Connection and Isolation Panel [NHANES]    Frequency of Communication with Friends and Family: Three times a week    Frequency of Social Gatherings with Friends and Family: Twice a week    Attends Religious Services: More than 4 times per year    Active Member of [employer] or Organizations: Yes    Attends Engineer, structural: More than 4 times per year    Marital Status: Married  Catering manager Violence: Not on file    ROS  ROS: Gen: no fever, chills  Skin: no rash, itching ENT: no ear pain, ear drainage, nasal congestion, rhinorrhea, sinus pressure, sore throat Eyes: no blurry vision, double vision Resp: no cough, wheeze,SOB CV: no CP, palpitations, LE edema,  GI: no heartburn, n/v/d/c, abd pain GU: no dysuria, urgency, frequency, hematuria MSK: chronic knees Neuro: no dizziness, headache, weakness, vertigo Psych: no depression, anxiety, insomnia, SI   Hair thinning-considering nutrifol  Objective:   Today's Vitals: BP 132/66   Pulse (!) 50   Temp (!) 97.5 F (36.4 C) (Temporal)   Resp 16   Ht 5\' 7"  (1.702 m)   Wt 224 lb 2 oz (101.7 kg)   SpO2 97%   BMI 35.10 kg/m   Physical Exam  Gen: [facility] NAD HEENT: NCAT, conjunctiva not injected, sclera nonicteric  NECK:  supple, no thyromegaly, no nodes, no carotid bruits CARDIAC: RRR, S1S2+, no murmur. DP 2+B LUNGS: CTAB. No wheezes ABDOMEN:  BS+, soft, NTND, No HSM, no masses EXT:  no edema MSK: no gross abnormalities.  NEURO: A&O x3.  CN  II-XII intact.  PSYCH: normal mood. Good eye contact   Reviewed labs  Assessment & Plan:  Essential hypertension  Mixed hyperlipidemia -     Comprehensive metabolic panel with GFR -     Lipid  panel -     TSH  Aneurysm of ascending aorta without rupture (HCC)  Abnormal glucose -     Hemoglobin A1c  Gastroesophageal reflux disease without esophagitis  Anemia, unspecified type -     CBC with Differential/Platelet -     IBC + Ferritin -     Vitamin B12  High risk medication use -     Comprehensive metabolic panel with GFR  Pulmonary HTN (HCC)  Assessment and Plan Assessment & Plan Pulmonary hypertension, mild   Mild pulmonary hypertension was confirmed via heart catheterization in October, with no blockages noted. Blood pressure management is crucial to prevent complications. Schedule a heart MRI and monitor blood pressure regularly.  Aortic aneurysm, ascending, without rupture   She has an ascending aortic aneurysm without rupture. Maintaining blood pressure within the target range is crucial to avoid exacerbating the aneurysm.  Hypertension   Hypertension is managed with valsartan  HCT, and blood pressure is well-controlled. She is advised to monitor blood pressure monthly, especially with potential medication changes due to her heart condition.  Supraventricular tachycardia (SVT)   SVT is managed with metoprolol  to control heart rate. She occasionally adjusts the dosage based on symptoms, with no recent episodes reported. Continue metoprolol  as needed.  Osteoarthritis of knees   Osteoarthritis in both knees limits physical activity due to the absence of cartilage. She is undergoing physical therapy post-surgery for bone spur removal from the right heel. Encourage remaining as active as possible within limitations to prevent further deterioration. Continue physical therapy, encourage short walks as tolerated, and perform knee strengthening exercises.  Prediabetes   Prediabetes with an A1c of 5.9% in October. She is advised to monitor her diet, particularly reducing intake of sugars and starches, due to limited ability to exercise because of knee  osteoarthritis.  Hyperlipidemia   Hyperlipidemia is managed with pravastatin , with no adverse effects reported. Blood work will be done to monitor cholesterol levels. Order a lipid panel.  Gastroesophageal reflux disease (GERD)   GERD is managed with pantoprazole , effectively controlling symptoms of heartburn and indigestion. Continue pantoprazole  daily.  Vitamin D  excess   Vitamin D  level was high at 79 ng/mL in October. She is currently taking 8000 IU daily and is advised to reduce intake to 5000 IU daily to prevent toxicity, especially with potential additional intake from new supplements.    Follow-up: Return in about 6 months (around 02/25/2024) for me 6 mo.  tina for awv after aug 15.   Ellsworth Haas, MD

## 2023-08-25 NOTE — Patient Instructions (Signed)
 Welcome to [facility]! It was a pleasure meeting you today.  As discussed, Please schedule a 6 month follow up visit today.  PLEASE NOTE:  If you had any LAB tests please let us know if you have not heard back within a few days. You may see your results on MyChart before we have a chance to review them but we will give you a call once they are reviewed by [place]. If we ordered any REFERRALS today, please let us know if you have not heard from their office within the next week.  Let us know through MyChart if you are needing REFILLS, or have your pharmacy send [place] the request. You can also use MyChart to communicate with me or any office staff.  Please try these tips to maintain a healthy lifestyle:  Eat most of your calories during the day when you are active. Eliminate processed foods including packaged sweets (pies, cakes, cookies), reduce intake of potatoes, white bread, white pasta, and white rice. Look for whole grain options, oat flour or almond flour.  Each meal should contain half fruits/vegetables, one quarter protein, and one quarter carbs (no bigger than a computer mouse).  Cut down on sweet beverages. This includes juice, soda, and sweet tea. Also watch fruit intake, though this is a healthier sweet option, it still contains natural sugar! Limit to 3 servings daily.  Drink at least 1 glass of water with each meal and aim for at least 8 glasses per day  Exercise at least 150 minutes every week.

## 2023-08-26 ENCOUNTER — Ambulatory Visit: Payer: Self-pay | Admitting: Family Medicine

## 2023-08-26 DIAGNOSIS — I272 Pulmonary hypertension, unspecified: Secondary | ICD-10-CM | POA: Insufficient documentation

## 2023-08-26 DIAGNOSIS — K219 Gastro-esophageal reflux disease without esophagitis: Secondary | ICD-10-CM | POA: Insufficient documentation

## 2023-08-26 NOTE — Progress Notes (Signed)
 Labs great except 1.  Iron stores are low-this can contribute to hair loss.  Take vitamin w/iron daily(all the over 50 vitamins don't have it so make sure it is a vitamin w/iron) 2.  Colonscopy is due so call [phone] to schedule

## 2023-08-30 ENCOUNTER — Telehealth: Payer: Self-pay | Admitting: Family Medicine

## 2023-08-30 DIAGNOSIS — K219 Gastro-esophageal reflux disease without esophagitis: Secondary | ICD-10-CM

## 2023-08-30 NOTE — Telephone Encounter (Unsigned)
 Copied from CRM [id number]. Topic: Clinical - Medication Refill >> Aug 30, 2023  2:28 PM Valeri Gate H wrote: Medication: pantoprazole  (PROTONIX ) 40 MG tablet   Has the patient contacted their pharmacy? Yes, it is the pharmacy who called to request refill on behalf of patient  (Agent: If no, request that the patient contact the pharmacy for the refill. If patient does not wish to contact the pharmacy document the reason why and proceed with request.) (Agent: If yes, when and what did the pharmacy advise?)  This is the patient's preferred pharmacy:  [facility] - [place], [place] - [address] [place] [address] Phone: [phone] Fax: [phone]  Is this the correct pharmacy for this prescription? Yes If no, delete pharmacy and type the correct one.   Has the prescription been filled recently? No  Is the patient out of the medication? Yes  Has the patient been seen for an appointment in the last year OR does the patient have an upcoming appointment? Yes  Can we respond through MyChart? Yes  Agent: Please be advised that Rx refills may take up to 3 business days. We ask that you follow-up with your pharmacy.

## 2023-08-31 MED ORDER — PANTOPRAZOLE SODIUM 40 MG PO TBEC: DELAYED_RELEASE_TABLET | ORAL | 3 refills | Status: AC

## 2023-09-09 ENCOUNTER — Telehealth (HOSPITAL_COMMUNITY): Payer: Self-pay | Admitting: Cardiology

## 2023-09-09 NOTE — Telephone Encounter (Signed)
 Front office called pt to make a f/u appt with Dr. Bruce Caper, per pt she is in the middle of moving and has not has her CT scan done. Pt wants to wait until she has moved and completed her CT scan to make a f/u appt with Dr. Bruce Caper.

## 2023-09-16 ENCOUNTER — Telehealth (HOSPITAL_COMMUNITY): Payer: Self-pay | Admitting: Cardiology

## 2023-09-16 NOTE — Telephone Encounter (Signed)
 pt stated she would like to hold off on appt with Dr. Bruce Caper until she has had her cardiac CT scan and hold off until after she gets back from vacation. pt is also in the process of moving per pt.

## 2023-10-05 ENCOUNTER — Ambulatory Visit: Admitting: Family Medicine

## 2023-10-24 DIAGNOSIS — M1711 Unilateral primary osteoarthritis, right knee: Secondary | ICD-10-CM | POA: Diagnosis not present

## 2023-10-25 DIAGNOSIS — M1712 Unilateral primary osteoarthritis, left knee: Secondary | ICD-10-CM | POA: Diagnosis not present

## 2023-10-26 ENCOUNTER — Other Ambulatory Visit (HOSPITAL_COMMUNITY): Payer: Self-pay | Admitting: Nurse Practitioner

## 2023-10-26 DIAGNOSIS — I480 Paroxysmal atrial fibrillation: Secondary | ICD-10-CM

## 2023-10-31 ENCOUNTER — Telehealth: Payer: Self-pay | Admitting: *Deleted

## 2023-10-31 DIAGNOSIS — M1711 Unilateral primary osteoarthritis, right knee: Secondary | ICD-10-CM | POA: Diagnosis not present

## 2023-10-31 NOTE — Telephone Encounter (Signed)
 Copied from CRM [id number]. Topic: Referral - Question >> Oct 31, 2023  8:43 AM Essie A wrote: Reason for CRM: Patient discovered she has a mole on head close to the crown.  It's about 2 inches long.  She wants to know if she should see her pcp or can she get a referral to see a dermatologist.  If she should see a dermatologist, please give her a name that you refer patients to. Please return her call at [phone].

## 2023-11-02 DIAGNOSIS — M1712 Unilateral primary osteoarthritis, left knee: Secondary | ICD-10-CM | POA: Diagnosis not present

## 2023-11-07 ENCOUNTER — Encounter: Payer: Self-pay | Admitting: Family Medicine

## 2023-11-07 ENCOUNTER — Ambulatory Visit (INDEPENDENT_AMBULATORY_CARE_PROVIDER_SITE_OTHER): Admitting: Family Medicine

## 2023-11-07 VITALS — BP 132/80 | HR 52 | Temp 97.5°F | Resp 18 | Ht 67.0 in | Wt 222.4 lb

## 2023-11-07 DIAGNOSIS — R790 Abnormal level of blood mineral: Secondary | ICD-10-CM

## 2023-11-07 DIAGNOSIS — I10 Essential (primary) hypertension: Secondary | ICD-10-CM

## 2023-11-07 DIAGNOSIS — L821 Other seborrheic keratosis: Secondary | ICD-10-CM

## 2023-11-07 NOTE — Patient Instructions (Signed)
 Iron 325mg  or 65mg  elemental iron  Prenatal vitamin

## 2023-11-07 NOTE — Progress Notes (Signed)
 Subjective:     Patient ID: Claudia [name], female    DOB: Nov 02, 1950, 73 y.o.   MRN: [id number]  Chief Complaint  Patient presents with   Referral    Discuss need for referral to dermatology for mole on head close to the crown, about 2 inches long,noticed several weeks ago, denies any pain      HPI Discussed the use of AI scribe software for clinical note transcription with the patient, who gave verbal consent to proceed.  History of Present Illness Claudia [name] is a 73 year old female who presents with a mole on the back of her head and elevated blood pressure.  She noticed a mole on the back of her head while rubbing or scratching her head. Her hair has thinned, making the mole more noticeable. Initially, she thought it was a scar from childhood stitches, but her husband confirmed the presence of the mole. The mole sometimes feels crusty but does not bleed, itch, hurt, or sting. She is unsure how long it has been present. No history of skin cancers or other unusual moles.  She feels fine and is taking her medication as prescribed but forgets to check her blood pressure at home despite having a cuff. Her blood pressure was reportedly fine during her last visit in May.  She takes zinc, vitamin C, vitamin D , and vitamin K. She recalls being advised to take a multivitamin and iron due to concerns about familial hair loss and low iron stores. She had anemia in February but is unsure of the cause. No bleeding from the bowels, dark tarry stools, or donating blood. She does not eat much red meat and is considering dietary changes to include more iron-rich foods like spinach.    Health Maintenance Due  Topic Date Due   Colonoscopy  11/04/2022   DEXA SCAN  05/09/2023   INFLUENZA VACCINE  11/04/2023   Medicare Annual Wellness (AWV)  11/17/2023    Past Medical History:  Diagnosis Date   Achilles tendonitis    R heal, limits activity   Allergy    Anxiety    Arthritis     Elevated hemoglobin A1c    GERD (gastroesophageal reflux disease)    Heart murmur    slight   Hyperlipidemia    Hypertension    pulmonary   IBS (irritable bowel syndrome)    Insomnia    Obesity    Osteoporosis 5 Years ago?   Palpitations    Pneumonia    as a child   Pulmonary HTN (HCC)    SVT (supraventricular tachycardia) (HCC) 10/29/2013   Thoracic aortic aneurysm without rupture (HCC)    Vitamin D  deficiency     Past Surgical History:  Procedure Laterality Date   ACHILLES TENDON SURGERY Right 06/10/2023   Procedure: RIGHT HEEL ACHILLES TENDON REPAIR AND SPUR EXCISION;  Surgeon: Sheril Coy, MD;  Location: [facility];  Service: Orthopedics;  Laterality: Right;   CESAREAN SECTION     CHOLECYSTECTOMY  10 years ago   RIGHT HEART CATH N/A 02/01/2023   Procedure: RIGHT HEART CATH;  Surgeon: Gardenia Led, DO;  Location: [facility];  Service: Cardiovascular;  Laterality: N/A;   TUBAL LIGATION Bilateral      Current Outpatient Medications:    Ascorbic Acid (VITAMIN C PO), Take 500 mg by mouth 2 (two) times daily., Disp: , Rfl:    Cholecalciferol (VITAMIN D -3 PO), Take 4,000 Units by mouth in the morning and at bedtime.,  Disp: , Rfl:    CINNAMON PO, Take 1,000 mg by mouth at bedtime., Disp: , Rfl:    diclofenac  Sodium (VOLTAREN ) 1 % GEL, Apply 4 g topically 4 (four) times daily as needed (pain)., Disp: , Rfl:    dicyclomine  (BENTYL ) 20 MG tablet, Take  1/2 to 1 tablet   4 x /day  before meals & Bedtime for Nausea, Cramping, Bloating or Diarrhea (Patient taking differently: Take 20 mg by mouth 4 (four) times daily as needed for spasms.), Disp: 60 tablet, Rfl: 1   diphenhydrAMINE (BENADRYL) 25 mg capsule, Take 25 mg by mouth at bedtime., Disp: , Rfl:    ELIQUIS  5 MG TABS tablet, TAKE 1 TABLET TWICE A DAY, Disp: 180 tablet, Rfl: 3   HOMEOPATHIC PRODUCTS EX, Place 1 patch onto the skin daily as needed (knee pain). Natural Knee Pain Patch-Herbal Knee Patches  (Peppermint/Capsicum/Camphor/Ginger/Safflower/[place] extracts) Applied knee/heel., Disp: , Rfl:    lidocaine  (LIDODERM ) 5 %, Place 1 patch onto the skin daily as needed (pain.)., Disp: , Rfl:    loperamide (IMODIUM A-D) 2 MG tablet, Take 6 mg by mouth in the morning., Disp: , Rfl:    Menaquinone-7 (VITAMIN K2 PO), Take 100 mcg by mouth in the morning., Disp: , Rfl:    metoprolol  succinate (TOPROL -XL) 25 MG 24 hr tablet, TAKE ONE-HALF (1/2) TABLET DAILY, Disp: 45 tablet, Rfl: 3   Omega-3 Fatty Acids (FISH OIL) 1000 MG CAPS, Take 1,000 mg by mouth at bedtime., Disp: , Rfl:    ondansetron  (ZOFRAN ) 8 MG tablet, Take 1/2 to 1 tablet 3 x / day (every 6 hours) as needed for nausea / Vomitting, Disp: 90 tablet, Rfl: 0   oxybutynin  (DITROPAN -XL) 10 MG 24 hr tablet, TAKE 1 TABLET AT SUPPERTIME FOR OVERACTIVE BLADDER, Disp: 90 tablet, Rfl: 3   pantoprazole  (PROTONIX ) 40 MG tablet, Take  1 tablet  Daily  to Prevent Heartburn &  Indigestion, Disp: 90 tablet, Rfl: 3   pravastatin  (PRAVACHOL ) 20 MG tablet, TAKE 1 TABLET DAILY, Disp: 90 tablet, Rfl: 3   valsartan -hydrochlorothiazide  (DIOVAN  HCT) 320-25 MG tablet, Take 1 tablet by mouth daily., Disp: 90 tablet, Rfl: 3   vitamin B-12 (CYANOCOBALAMIN ) 500 MCG tablet, Take 500 mcg by mouth in the morning., Disp: , Rfl:    ZINC GLUCONATE PO, Take 25 mg by mouth at bedtime., Disp: , Rfl:   Allergies  Allergen Reactions   Amlodipine  Shortness Of Breath   Ciprofloxacin Other (See Comments)    Felt worse   Levaquin [Levofloxacin In D5w]     No fluroquinolones due to ascending thoracic aortic aneurysm   Penicillins Other (See Comments)    Had as a child=unknown reaction   Zoloft [Sertraline Hcl] Other (See Comments)    Felt bad   ROS neg/noncontributory except as noted HPI/below      Objective:     BP 132/80 (BP Location: Left Arm, Patient Position: Sitting, Cuff Size: Normal)   Pulse (!) 52   Temp (!) 97.5 F (36.4 C) (Temporal)   Resp 18   Ht 5'  7 (1.702 m)   Wt 222 lb 6 oz (100.9 kg)   SpO2 99%   BMI 34.83 kg/m  Wt Readings from Last 3 Encounters:  11/07/23 222 lb 6 oz (100.9 kg)  08/25/23 224 lb 2 oz (101.7 kg)  06/10/23 223 lb 6.4 oz (101.3 kg)    Physical Exam   Gen: [facility] NAD HEENT: NCAT, conjunctiva not injected, sclera nonicteric EXT:  no edema MSK:  no gross abnormalities.  NEURO: A&O x3.  CN II-XII intact.  PSYCH: normal mood. Good eye contact   Scalp-sk on crown-approx 5mm    Assessment & Plan:  Seborrheic keratoses  Low iron stores  Essential hypertension  Assessment and Plan Assessment & Plan Seborrheic keratosis of scalp   Seborrheic keratosis on the scalp is non-cancerous and presents no pain, bleeding, itching, or stinging. There is concern due to its appearance and increased visibility from hair thinning. Document the appearance with a photograph for the medical chart.  Hypertension   Blood pressure was elevated during the visit. She adheres to medication but does not regularly monitor blood pressure at home. Previous readings were normal in May. Encourage regular home blood pressure monitoring to assess for consistent hypertension. And let us  know  Iron deficiency without anemia   She has iron deficiency without current anemia, with a history of anemia in February. She is not taking iron supplements. Discussed potential causes including poor diet, absorption issues, or slow blood loss, with no signs of gastrointestinal bleeding. Potential contribution to hair loss was discussed. Initiate prenatal vitamin with iron supplementation. Reassess iron levels and anemia status at the next visit in December. Encourage dietary intake of iron-rich foods such as spinach.  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 Discussed potential link between low iron stores and hair loss.    Return for as sch in Dec.  Jenkins CHRISTELLA Carrel, MD

## 2023-11-08 ENCOUNTER — Ambulatory Visit (HOSPITAL_COMMUNITY)

## 2023-11-15 ENCOUNTER — Ambulatory Visit: Payer: TRICARE For Life (TFL) | Admitting: Nurse Practitioner

## 2023-11-18 ENCOUNTER — Ambulatory Visit: Payer: TRICARE For Life (TFL) | Admitting: Nurse Practitioner

## 2023-11-22 ENCOUNTER — Ambulatory Visit (INDEPENDENT_AMBULATORY_CARE_PROVIDER_SITE_OTHER)

## 2023-11-22 VITALS — Ht 68.0 in | Wt 222.0 lb

## 2023-11-22 DIAGNOSIS — Z0000 Encounter for general adult medical examination without abnormal findings: Secondary | ICD-10-CM

## 2023-11-22 NOTE — Patient Instructions (Signed)
 Ms. Parekh , Thank you for taking time out of your busy schedule to complete your Annual Wellness Visit with me. I enjoyed our conversation and look forward to speaking with you again next year. I, as well as your care team,  appreciate your ongoing commitment to your health goals. Please review the following plan we discussed and let me know if I can assist you in the future. Your Game plan/ To Do List    Referrals: If you haven't heard from the office you've been referred to, please reach out to them at the phone provided.   Follow up Visits: We will see or speak with you next year for your Next Medicare AWV with our clinical staff Have you seen your provider in the last 6 months (3 months if uncontrolled diabetes)? Yes  Clinician Recommendations:  Aim for 30 minutes of exercise or brisk walking, 6-8 glasses of water, and 5 servings of fruits and vegetables each day.       This is a list of the screenings recommended for you:  Health Maintenance  Topic Date Due   Zoster (Shingles) Vaccine (1 of 2) 10/11/1971   Colon Cancer Screening  11/04/2022   DEXA scan (bone density measurement)  05/09/2023   Medicare Annual Wellness Visit  11/17/2023   Flu Shot  11/04/2023   COVID-19 Vaccine (5 - 2024-25 season) 11/23/2023*   Mammogram  08/25/2024   DTaP/Tdap/Td vaccine (3 - Td or Tdap) 10/26/2026   Pneumococcal Vaccine for age over 25  Completed   Hepatitis C Screening  Completed   HPV Vaccine  Aged Out   Meningitis B Vaccine  Aged Out  *Topic was postponed. The date shown is not the original due date.    Advanced directives: (Declined) Advance directive discussed with you today. Even though you declined this today, please call our office should you change your mind, and we can give you the proper paperwork for you to fill out. Advance Care Planning is important because it:  [x]  Makes sure you receive the medical care that is consistent with your values, goals, and preferences  [x]  It  provides guidance to your family and loved ones and reduces their decisional burden about whether or not they are making the right decisions based on your wishes.  Follow the link provided in your after visit summary or read over the paperwork we have mailed to you to help you started getting your Advance Directives in place. If you need assistance in completing these, please reach out to us  so that we can help you!  See attachments for Preventive Care and Fall Prevention Tips.

## 2023-11-22 NOTE — Progress Notes (Addendum)
 Subjective:   Claudia [name] is a 73 y.o. who presents for a Medicare Wellness preventive visit.  As a reminder, Annual Wellness Visits don't include a physical exam, and some assessments may be limited, especially if this visit is performed virtually. We may recommend an in-person follow-up visit with your provider if needed.  Visit Complete: Virtual I connected with  Claudia [name] on 11/22/23 by a audio enabled telemedicine application and verified that I am speaking with the correct person using two identifiers.  Patient Location: Home  Provider Location: Office/Clinic  I discussed the limitations of evaluation and management by telemedicine. The patient expressed understanding and agreed to proceed.  Vital Signs: Because this visit was a virtual/telehealth visit, some criteria may be missing or patient reported. Any vitals not documented were not able to be obtained and vitals that have been documented are patient reported.  VideoDeclined- This patient declined Librarian, academic. Therefore the visit was completed with audio only.  Persons Participating in Visit: Patient.  AWV Questionnaire: No: Patient Medicare AWV questionnaire was not completed prior to this visit.  Cardiac Risk Factors include: advanced age (>31men, >66 women);dyslipidemia;hypertension;obesity (BMI >30kg/m2)     Objective:    Today's Vitals   11/22/23 1028  Weight: 222 lb (100.7 kg)  Height: 5' 8 (1.727 m)   Body mass index is 33.75 kg/m.     11/22/2023   10:31 AM 06/10/2023    7:19 AM 05/17/2023    9:47 AM 02/01/2023    9:04 AM 11/17/2022   12:27 PM 11/10/2021    3:48 PM 07/31/2021    9:17 PM  Advanced Directives  Does Patient Have a Medical Advance Directive? No No No No No No No  Would patient like information on creating a medical advance directive? No - Patient declined No - Patient declined  No - Patient declined  No - Patient declined     Current  Medications (verified) Outpatient Encounter Medications as of 11/22/2023  Medication Sig   Ascorbic Acid (VITAMIN C PO) Take 500 mg by mouth 2 (two) times daily.   Cholecalciferol (VITAMIN D -3 PO) Take 4,000 Units by mouth in the morning and at bedtime.   CINNAMON PO Take 1,000 mg by mouth at bedtime.   diclofenac  Sodium (VOLTAREN ) 1 % GEL Apply 4 g topically 4 (four) times daily as needed (pain).   dicyclomine  (BENTYL ) 20 MG tablet Take  1/2 to 1 tablet   4 x /day  before meals & Bedtime for Nausea, Cramping, Bloating or Diarrhea (Patient taking differently: Take 20 mg by mouth 4 (four) times daily as needed for spasms.)   diphenhydrAMINE (BENADRYL) 25 mg capsule Take 25 mg by mouth at bedtime.   ELIQUIS  5 MG TABS tablet TAKE 1 TABLET TWICE A DAY   HOMEOPATHIC PRODUCTS EX Place 1 patch onto the skin daily as needed (knee pain). Natural Knee Pain Patch-Herbal Knee Patches (Peppermint/Capsicum/Camphor/Ginger/Safflower/[place] extracts) Applied knee/heel.   lidocaine  (LIDODERM ) 5 % Place 1 patch onto the skin daily as needed (pain.).   loperamide (IMODIUM A-D) 2 MG tablet Take 6 mg by mouth in the morning.   Menaquinone-7 (VITAMIN K2 PO) Take 100 mcg by mouth in the morning.   metoprolol  succinate (TOPROL -XL) 25 MG 24 hr tablet TAKE ONE-HALF (1/2) TABLET DAILY   Omega-3 Fatty Acids (FISH OIL) 1000 MG CAPS Take 1,000 mg by mouth at bedtime.   ondansetron  (ZOFRAN ) 8 MG tablet Take 1/2 to 1 tablet 3 x / day (  every 6 hours) as needed for nausea / Vomitting   oxybutynin  (DITROPAN -XL) 10 MG 24 hr tablet TAKE 1 TABLET AT SUPPERTIME FOR OVERACTIVE BLADDER   pantoprazole  (PROTONIX ) 40 MG tablet Take  1 tablet  Daily  to Prevent Heartburn &  Indigestion   pravastatin  (PRAVACHOL ) 20 MG tablet TAKE 1 TABLET DAILY   valsartan -hydrochlorothiazide  (DIOVAN  HCT) 320-25 MG tablet Take 1 tablet by mouth daily.   vitamin B-12 (CYANOCOBALAMIN ) 500 MCG tablet Take 500 mcg by mouth in the morning.   ZINC GLUCONATE PO  Take 25 mg by mouth at bedtime.   No facility-administered encounter medications on file as of 11/22/2023.    Allergies (verified) Amlodipine , Ciprofloxacin, Levaquin [levofloxacin in d5w], Penicillins, and Zoloft [sertraline hcl]   History: Past Medical History:  Diagnosis Date   Achilles tendonitis    R heal, limits activity   Allergy    Anxiety    Arthritis    Elevated hemoglobin A1c    GERD (gastroesophageal reflux disease)    Heart murmur    slight   Hyperlipidemia    Hypertension    pulmonary   IBS (irritable bowel syndrome)    Insomnia    Obesity    Osteoporosis 5 Years ago?   Palpitations    Pneumonia    as a child   Pulmonary HTN (HCC)    SVT (supraventricular tachycardia) (HCC) 10/29/2013   Thoracic aortic aneurysm without rupture (HCC)    Vitamin D  deficiency    Past Surgical History:  Procedure Laterality Date   ACHILLES TENDON SURGERY Right 06/10/2023   Procedure: RIGHT HEEL ACHILLES TENDON REPAIR AND SPUR EXCISION;  Surgeon: Sheril Coy, MD;  Location: [facility];  Service: Orthopedics;  Laterality: Right;   CESAREAN SECTION     CHOLECYSTECTOMY  10 years ago   RIGHT HEART CATH N/A 02/01/2023   Procedure: RIGHT HEART CATH;  Surgeon: Gardenia Led, DO;  Location: [facility];  Service: Cardiovascular;  Laterality: N/A;   TUBAL LIGATION Bilateral    Family History  Problem Relation Age of Onset   Heart attack Mother    Stroke Mother    Heart disease Mother    Diabetes Mother    Macular degeneration Mother    Arthritis Mother    Vision loss Mother    Varicose Veins Mother    Heart attack Father    Alcohol abuse Father    Ovarian cancer Maternal Grandmother    Colon cancer Neg Hx    Social History   Socioeconomic History   Marital status: Married    Spouse name: Not on file   Number of children: 1   Years of education: Not on file   Highest education level: Some college, no degree  Occupational History   Not on file  Tobacco Use    Smoking status: Former    Current packs/day: 0.00    Types: Cigarettes    Quit date: 04/01/2004    Years since quitting: 19.6   Smokeless tobacco: Never   Tobacco comments:    Social smoker times 10 years  Vaping Use   Vaping status: Never Used  Substance and Sexual Activity   Alcohol use: Not Currently    Comment: rarely, < 1 beer per week   Drug use: No   Sexual activity: Yes    Birth control/protection: Surgical  Other Topics Concern   Not on file  Social History Narrative   Pt lives in [place] with spouse.  Retired from [employer].  [employer]   Social Drivers of Corporate investment banker Strain: Low Risk  (11/22/2023)   Overall Financial Resource Strain (CARDIA)    Difficulty of Paying Living Expenses: Not hard at all  Food Insecurity: No Food Insecurity (11/22/2023)   Hunger Vital Sign    Worried About Running Out of Food in the Last Year: Never true    Ran Out of Food in the Last Year: Never true  Transportation Needs: No Transportation Needs (11/22/2023)   PRAPARE - Administrator, Civil Service (Medical): No    Lack of Transportation (Non-Medical): No  Physical Activity: Inactive (11/22/2023)   Exercise Vital Sign    Days of Exercise per Week: 0 days    Minutes of Exercise per Session: 0 min  Stress: No Stress Concern Present (11/22/2023)   [employer] of Occupational Health - Occupational Stress Questionnaire    Feeling of Stress: Not at all  Social Connections: Moderately Integrated (11/22/2023)   Social Connection and Isolation Panel    Frequency of Communication with Friends and Family: More than three times a week    Frequency of Social Gatherings with Friends and Family: More than three times a week    Attends Religious Services: More than 4 times per year    Active Member of [employer] or Organizations: No    Attends Banker Meetings: Never    Marital Status: Married    Tobacco Counseling Counseling given: Not  Answered Tobacco comments: Social smoker times 10 years    Clinical Intake:  Pre-visit preparation completed: Yes  Pain : No/denies pain     BMI - recorded: 33.75 Nutritional Status: BMI > 30  Obese Nutritional Risks: None Diabetes: No  Lab Results  Component Value Date   HGBA1C 5.7 08/25/2023   HGBA1C 5.9 (H) 01/20/2023   HGBA1C 5.7 (H) 11/17/2022     How often do you need to have someone help you when you read instructions, pamphlets, or other written materials from your doctor or pharmacy?: 1 - Never  Interpreter Needed?: No  Information entered by :: Ellouise Haws, LPN   Activities of Daily Living     11/22/2023   10:29 AM 05/17/2023    9:49 AM  In your present state of health, do you have any difficulty performing the following activities:  Hearing? 0   Vision? 0   Difficulty concentrating or making decisions? 0   Walking or climbing stairs? 0   Dressing or bathing? 0   Doing errands, shopping? 0 0  Preparing Food and eating ? N   Using the Toilet? N   In the past six months, have you accidently leaked urine? Y   Comment wears a pad   Do you have problems with loss of bowel control? N   Managing your Medications? N   Managing your Finances? N   Housekeeping or managing your Housekeeping? N     Patient Care Team: Wendolyn Jenkins Jansky, MD as PCP - General (Family Medicine) Delford Maude BROCKS, MD as Consulting Physician (Cardiology) [place], Heather, OD (Optometry)  I have updated your Care Teams any recent Medical Services you may have received from other providers in the past year.     Assessment:   This is a routine wellness examination for Claudia [name].  Hearing/Vision screen Hearing Screening - Comments:: Pt denies any hearing issues  Vision Screening - Comments:: Wears rx glasses - up to date with routine eye exams with [place] [facility]    Goals  Addressed             This Visit's Progress    Patient Stated       Lose weight and improve eating habits         Depression Screen     11/22/2023   10:32 AM 11/07/2023   11:32 AM 01/23/2023    1:23 PM 11/17/2022   12:28 PM 07/05/2022   10:37 PM 11/10/2021   10:11 PM 06/30/2021   12:21 AM  PHQ 2/9 Scores  PHQ - 2 Score 0 0 0 0 0 0 0    Fall Risk     11/22/2023   10:33 AM 01/23/2023    1:22 PM 11/17/2022   12:28 PM 07/05/2022   10:37 PM 02/15/2022   11:20 PM  Fall Risk   Falls in the past year? 0 0 0    Number falls in past yr: 0      Injury with Fall? 0      Risk for fall due to : No Fall Risks No Fall Risks  No Fall Risks No Fall Risks  Follow up Falls prevention discussed Falls prevention discussed;Education provided;Falls evaluation completed  Falls evaluation completed;Education provided;Falls prevention discussed Falls prevention discussed;Education provided;Falls evaluation completed      Data saved with a previous flowsheet row definition    MEDICARE RISK AT HOME:  Medicare Risk at Home Any stairs in or around the home?: Yes If so, are there any without handrails?: No Home free of loose throw rugs in walkways, pet beds, electrical cords, etc?: Yes Adequate lighting in your home to reduce risk of falls?: Yes Life alert?: No Use of a cane, walker or w/c?: No Grab bars in the bathroom?: Yes Shower chair or bench in shower?: Yes Elevated toilet seat or a handicapped toilet?: No  TIMED UP AND GO:  Was the test performed?  No  Cognitive Function: 6CIT completed        11/22/2023   10:34 AM  6CIT Screen  What Year? 0 points  What month? 0 points  What time? 0 points  Count back from 20 0 points  Months in reverse 0 points  Repeat phrase 0 points  Total Score 0 points    Immunizations Immunization History  Administered Date(s) Administered   Fluad Quad(high Dose 65+) 12/15/2018, 01/22/2020   Influenza Split 02/19/2013   Influenza, High Dose Seasonal PF 01/25/2018, 02/16/2022, 02/14/2023   Influenza, Seasonal, Injecte, Preservative Fre 03/12/2015    Influenza,inj,Quad PF,6+ Mos 01/27/2017   Influenza-Unspecified 04/06/2011   PFIZER(Purple Top)SARS-COV-2 Vaccination 04/27/2019, 05/18/2019, 12/14/2019, 08/26/2020   PPD Test 10/15/2013   Pneumococcal Conjugate-13 01/25/2018   Pneumococcal Polysaccharide-23 09/13/2019   Pneumococcal-Unspecified 04/06/1995   Td 10/25/2016   Tdap 04/05/2006   Zoster, Live 07/04/2013, 08/05/2013    Screening Tests Health Maintenance  Topic Date Due   Zoster Vaccines- Shingrix (1 of 2) 10/11/1971   Colonoscopy  11/04/2022   DEXA SCAN  05/09/2023   INFLUENZA VACCINE  11/04/2023   COVID-19 Vaccine (5 - 2024-25 season) 11/23/2023 (Originally 12/05/2022)   MAMMOGRAM  08/25/2024   Medicare Annual Wellness (AWV)  11/21/2024   DTaP/Tdap/Td (3 - Td or Tdap) 10/26/2026   Pneumococcal Vaccine: 50+ Years  Completed   Hepatitis C Screening  Completed   HPV VACCINES  Aged Out   Meningococcal B Vaccine  Aged Out    Health Maintenance  Health Maintenance Due  Topic Date Due   Zoster Vaccines- Shingrix (1 of 2) 10/11/1971  Colonoscopy  11/04/2022   DEXA SCAN  05/09/2023   INFLUENZA VACCINE  11/04/2023   Health Maintenance Items Addressed: See Nurse Notes at the end of this note  Additional Screening:  Vision Screening: Recommended annual ophthalmology exams for early detection of glaucoma and other disorders of the eye. Would you like a referral to an eye doctor? No    Dental Screening: Recommended annual dental exams for proper oral hygiene  Community Resource Referral / Chronic Care Management: CRR required this visit?  No   CCM required this visit?  No   Plan:    I have personally reviewed and noted the following in the patient's chart:   Medical and social history Use of alcohol, tobacco or illicit drugs  Current medications and supplements including opioid prescriptions. Patient is not currently taking opioid prescriptions. Functional ability and status Nutritional status Physical  activity Advanced directives List of other physicians Hospitalizations, surgeries, and ER visits in previous 12 months Vitals Screenings to include cognitive, depression, and falls Referrals and appointments  In addition, I have reviewed and discussed with patient certain preventive protocols, quality metrics, and best practice recommendations. A written personalized care plan for preventive services as well as general preventive health recommendations were provided to patient.   Ellouise VEAR Haws, LPN   1/80/7974   After Visit Summary: (MyChart) Due to this being a telephonic visit, the after visit summary with patients personalized plan was offered to patient via MyChart   Notes: Pt declined dexa scan and colonoscopy at this time. Stated she will follow up to make arrangements at a later date

## 2023-11-23 ENCOUNTER — Other Ambulatory Visit: Payer: Self-pay | Admitting: Cardiovascular Disease

## 2023-11-23 DIAGNOSIS — I480 Paroxysmal atrial fibrillation: Secondary | ICD-10-CM

## 2023-11-23 NOTE — Telephone Encounter (Signed)
 Eliquis  5mg  refill request received. Patient is 73 years old, weight-100.7kg, Crea-0.70 on 08/25/23, Diagnosis-Afib, and last seen by Dr Gardenia on 04/28/23. Dose is appropriate based on dosing criteria. Will send in refill to requested pharmacy.

## 2023-11-28 ENCOUNTER — Ambulatory Visit (HOSPITAL_COMMUNITY)
Admission: RE | Admit: 2023-11-28 | Discharge: 2023-11-28 | Disposition: A | Discharge status: Home / Self Care | Source: Ambulatory Visit | Attending: Nurse Practitioner | Admitting: Nurse Practitioner

## 2023-11-28 DIAGNOSIS — I480 Paroxysmal atrial fibrillation: Secondary | ICD-10-CM | POA: Diagnosis not present

## 2023-11-28 DIAGNOSIS — I2510 Atherosclerotic heart disease of native coronary artery without angina pectoris: Secondary | ICD-10-CM | POA: Diagnosis not present

## 2023-11-28 DIAGNOSIS — I77810 Thoracic aortic ectasia: Secondary | ICD-10-CM | POA: Diagnosis not present

## 2023-11-28 MED ORDER — IOHEXOL 350 MG/ML SOLN
75.0000 mL | Freq: Once | INTRAVENOUS | Status: AC | PRN
  Administered 2023-11-28: 75 mL via INTRAVENOUS

## 2023-12-08 ENCOUNTER — Ambulatory Visit: Payer: Self-pay | Admitting: Nurse Practitioner

## 2023-12-13 DIAGNOSIS — H47323 Drusen of optic disc, bilateral: Secondary | ICD-10-CM | POA: Diagnosis not present

## 2023-12-19 ENCOUNTER — Telehealth (HOSPITAL_COMMUNITY): Payer: Self-pay | Admitting: Cardiology

## 2023-12-19 NOTE — Telephone Encounter (Signed)
 Patient called to request medication to assist with relaxing for upcoming cardiac MRI 9/22

## 2023-12-20 MED ORDER — LORAZEPAM 1 MG PO TABS: 1.0000 mg | ORAL_TABLET | Freq: Once | ORAL | 0 refills | Status: AC

## 2023-12-20 MED ORDER — LORAZEPAM 1 MG PO TABS
1.0000 mg | ORAL_TABLET | Freq: Once | ORAL | 0 refills | Status: DC
Start: 2023-12-20 — End: 2023-12-20

## 2023-12-20 NOTE — Telephone Encounter (Signed)
 Pt aware and voiced understanding  Script phoned into pharmacy

## 2023-12-22 ENCOUNTER — Encounter (HOSPITAL_COMMUNITY): Payer: Self-pay

## 2023-12-26 ENCOUNTER — Ambulatory Visit (HOSPITAL_COMMUNITY)
Admission: RE | Admit: 2023-12-26 | Discharge: 2023-12-26 | Disposition: A | Discharge status: Home / Self Care | Source: Ambulatory Visit | Attending: Cardiology | Admitting: Cardiology

## 2023-12-26 ENCOUNTER — Other Ambulatory Visit (HOSPITAL_COMMUNITY): Payer: Self-pay | Admitting: Cardiology

## 2023-12-26 DIAGNOSIS — I2721 Secondary pulmonary arterial hypertension: Secondary | ICD-10-CM

## 2023-12-26 MED ORDER — GADOBUTROL 1 MMOL/ML IV SOLN
10.0000 mL | Freq: Once | INTRAVENOUS | Status: AC | PRN
Start: 2023-12-26 — End: 2023-12-26
  Administered 2023-12-26: 10 mL via INTRAVENOUS

## 2023-12-29 ENCOUNTER — Encounter: Payer: Self-pay | Admitting: Family Medicine

## 2024-02-10 ENCOUNTER — Other Ambulatory Visit (HOSPITAL_COMMUNITY): Payer: Self-pay | Admitting: Cardiology

## 2024-02-10 MED ORDER — VALSARTAN-HYDROCHLOROTHIAZIDE 320-25 MG PO TABS: 1.0000 | ORAL_TABLET | Freq: Every day | ORAL | 3 refills | Status: AC

## 2024-02-23 ENCOUNTER — Ambulatory Visit (HOSPITAL_COMMUNITY)
Admission: RE | Admit: 2024-02-23 | Discharge: 2024-02-23 | Disposition: A | Discharge status: Home / Self Care | Source: Ambulatory Visit | Attending: Cardiology | Admitting: Cardiology

## 2024-02-23 VITALS — BP 156/82 | HR 56 | Wt 229.6 lb

## 2024-02-23 DIAGNOSIS — I272 Pulmonary hypertension, unspecified: Secondary | ICD-10-CM

## 2024-02-23 DIAGNOSIS — Z7901 Long term (current) use of anticoagulants: Secondary | ICD-10-CM | POA: Insufficient documentation

## 2024-02-23 DIAGNOSIS — I5032 Chronic diastolic (congestive) heart failure: Secondary | ICD-10-CM

## 2024-02-23 DIAGNOSIS — Z79899 Other long term (current) drug therapy: Secondary | ICD-10-CM | POA: Insufficient documentation

## 2024-02-23 DIAGNOSIS — I480 Paroxysmal atrial fibrillation: Secondary | ICD-10-CM | POA: Diagnosis not present

## 2024-02-23 DIAGNOSIS — I110 Hypertensive heart disease with heart failure: Secondary | ICD-10-CM | POA: Insufficient documentation

## 2024-02-23 NOTE — Patient Instructions (Signed)
 CONGRATULATIONS YOU HAVE GRADUATED FROM THE [facility].  There has been no changes to your medications.  At the [facility], you and your health needs are our priority. As part of our continuing mission to provide you with exceptional heart care, we have created designated Provider Care Teams. These Care Teams include your primary Cardiologist (physician) and Advanced Practice Providers (APPs- Physician Assistants and Nurse Practitioners) who all work together to provide you with the care you need, when you need it.   You may see any of the following providers on your designated Care Team at your next follow up: Dr Toribio Fuel Dr Ezra Shuck Dr. [name] Greig Mosses, NP Caffie Shed, [place] [facility] [place], [place] Beckey Coe, NP Jordan Lee, NP Ellouise Class, NP Tinnie Redman, PharmD Jaun Bash, PharmD   Please be sure to bring in all your medications bottles to every appointment.    Thank you for choosing [facility]

## 2024-02-25 NOTE — Progress Notes (Signed)
   ADVANCED [facility] FAILURE FOLLOW UP CLINIC NOTE  Referring Physician: Wendolyn Jenkins Jansky, MD  Primary Care: Wendolyn Jenkins Jansky, MD Primary Cardiologist:  HPI: Claudia [name] is a 73 y.o. female who presents for follow up of pulmonary hypertension.      Her cardiac history dates back to April 2015 when she presented with complaints of palpitations. Echocardiogram at that time with a EF of 60 to 65%. She was diagnosed with paroxysmal atrial fibrillation and started on Eliquis . She reports doing fairly well until she gained a lot of weight over the summer due to poor nutrition. She reports that during this time she started to have shortness of breath and lower extremity edema. She had an echocardiogram with RVSP approaching 70 mmHg. She presents today for evaluation of pulmonary hypertension.      SUBJECTIVE:  Overall doing well, minimal complaints. She reports no significant exertional shortness of breath.   PMH, current medications, allergies, social history, and family history reviewed in epic.  PHYSICAL EXAM: Vitals:   02/23/24 1034  BP: (!) 156/82  Pulse: (!) 56  SpO2: 98%   GENERAL: Well nourished and in no apparent distress at rest.  PULM:  Normal work of breathing, clear to auscultation bilaterally. Respirations are unlabored.  CARDIAC:  JVP: flatN         Normal rate with regular rhythm. No murmurs, rubs or gallops.  No edema. Warm and well perfused extremities. ABDOMEN: Soft, non-tender, non-distended. NEUROLOGIC: Patient is oriented x3 with no focal or lateralizing neurologic deficits.    ASSESSMENT & PLAN:  Pulmonary hypertension -Echocardiogram (12/23/2022) with RVSP approaching 70 mmHg.  Left atrium is also mild to moderately elevated.  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 Pressures significantly improved with afterload reduction. - Continue  valsartan -hydrochlorothiazide  320-25 - Discussed SGLT2 and MRA, but minimal symptoms at this point in time  - RV normal on CMR  Paroxysmal atrial fibrillation -Continue toprol  12.5mg  daily.  -Continue apixaban  5 mg twice daily. -Continue follow-up with cardiology.   3.  Hypertension -Continue diovan .    4.  Hyperlipidemia -Continue pravastatin  20 mg daily.   5. Right atrial echodensity - Reported on TTE from 12/23/22 - CMR reviewed, no evidence of mass.  No need for further advanced HF follow up  Claudia Brownie, MD [facility] Mechanical Circulatory Support 02/25/24

## 2024-03-14 ENCOUNTER — Ambulatory Visit: Admitting: Family Medicine

## 2024-03-14 VITALS — BP 136/78 | HR 54 | Temp 97.4°F | Ht 68.0 in | Wt 231.4 lb

## 2024-03-14 DIAGNOSIS — E559 Vitamin D deficiency, unspecified: Secondary | ICD-10-CM

## 2024-03-14 DIAGNOSIS — E782 Mixed hyperlipidemia: Secondary | ICD-10-CM | POA: Diagnosis not present

## 2024-03-14 DIAGNOSIS — R790 Abnormal level of blood mineral: Secondary | ICD-10-CM | POA: Diagnosis not present

## 2024-03-14 DIAGNOSIS — I4819 Other persistent atrial fibrillation: Secondary | ICD-10-CM

## 2024-03-14 DIAGNOSIS — I7121 Aneurysm of the ascending aorta, without rupture: Secondary | ICD-10-CM

## 2024-03-14 DIAGNOSIS — N3281 Overactive bladder: Secondary | ICD-10-CM

## 2024-03-14 DIAGNOSIS — I10 Essential (primary) hypertension: Secondary | ICD-10-CM

## 2024-03-14 DIAGNOSIS — K219 Gastro-esophageal reflux disease without esophagitis: Secondary | ICD-10-CM

## 2024-03-14 DIAGNOSIS — Z23 Encounter for immunization: Secondary | ICD-10-CM

## 2024-03-14 DIAGNOSIS — Z79899 Other long term (current) drug therapy: Secondary | ICD-10-CM | POA: Diagnosis not present

## 2024-03-14 DIAGNOSIS — M810 Age-related osteoporosis without current pathological fracture: Secondary | ICD-10-CM | POA: Diagnosis not present

## 2024-03-14 DIAGNOSIS — R7303 Prediabetes: Secondary | ICD-10-CM | POA: Diagnosis not present

## 2024-03-14 DIAGNOSIS — I272 Pulmonary hypertension, unspecified: Secondary | ICD-10-CM

## 2024-03-14 LAB — COMPREHENSIVE METABOLIC PANEL WITH GFR
ALT: 18 U/L (ref 0–35)
AST: 25 U/L (ref 0–37)
Albumin: 4 g/dL (ref 3.5–5.2)
Alkaline Phosphatase: 67 U/L (ref 39–117)
BUN: 14 mg/dL (ref 6–23)
CO2: 31 meq/L (ref 19–32)
Calcium: 9.2 mg/dL (ref 8.4–10.5)
Chloride: 101 meq/L (ref 96–112)
Creatinine, Ser: 0.63 mg/dL (ref 0.40–1.20)
GFR: 89.25 mL/min (ref >60.00)
Glucose, Bld: 98 mg/dL (ref 70–99)
Potassium: 3.8 meq/L (ref 3.5–5.1)
Sodium: 138 meq/L (ref 135–145)
Total Bilirubin: 0.6 mg/dL (ref 0.2–1.2)
Total Protein: 6.9 g/dL (ref 6.0–8.3)

## 2024-03-14 LAB — CBC WITH DIFFERENTIAL/PLATELET
Basophils Absolute: 0 K/uL (ref 0.0–0.1)
Basophils Relative: 0.8 % (ref 0.0–3.0)
Eosinophils Absolute: 0.1 K/uL (ref 0.0–0.7)
Eosinophils Relative: 2.8 % (ref 0.0–5.0)
HCT: 36.3 % (ref 36.0–46.0)
Hemoglobin: 12 g/dL (ref 12.0–15.0)
Lymphocytes Relative: 28.3 % (ref 12.0–46.0)
Lymphs Abs: 1.5 K/uL (ref 0.7–4.0)
MCHC: 33 g/dL (ref 30.0–36.0)
MCV: 87.5 fl (ref 78.0–100.0)
Monocytes Absolute: 0.6 K/uL (ref 0.1–1.0)
Monocytes Relative: 11.2 % (ref 3.0–12.0)
Neutro Abs: 2.9 K/uL (ref 1.4–7.7)
Neutrophils Relative %: 56.9 % (ref 43.0–77.0)
Platelets: 225 K/uL (ref 150.0–400.0)
RBC: 4.14 Mil/uL (ref 3.87–5.11)
RDW: 15.3 % (ref 11.5–15.5)
WBC: 5.2 K/uL (ref 4.0–10.5)

## 2024-03-14 LAB — VITAMIN D 25 HYDROXY (VIT D DEFICIENCY, FRACTURES): VITD: 66.46 ng/mL (ref 30.00–100.00)

## 2024-03-14 LAB — VITAMIN B12: Vitamin B-12: 710 pg/mL (ref 211–911)

## 2024-03-14 LAB — HEMOGLOBIN A1C: Hgb A1c MFr Bld: 5.6 % (ref 4.6–6.5)

## 2024-03-14 LAB — IBC + FERRITIN
Ferritin: 18.5 ng/mL (ref 10.0–291.0)
Iron: 77 ug/dL (ref 42–145)
Saturation Ratios: 21 % (ref 20.0–50.0)
TIBC: 366.8 ug/dL (ref 250.0–450.0)
Transferrin: 262 mg/dL (ref 212.0–360.0)

## 2024-03-14 LAB — MAGNESIUM: Magnesium: 1.9 mg/dL (ref 1.5–2.5)

## 2024-03-14 MED ORDER — OXYBUTYNIN CHLORIDE ER 10 MG PO TB24: ORAL_TABLET | ORAL | 3 refills | Status: AC

## 2024-03-14 NOTE — Patient Instructions (Signed)

## 2024-03-14 NOTE — Progress Notes (Signed)
 Subjective:     Patient ID: Claudia [name], female    DOB: June 28, 1950, 73 y.o.   MRN: [id number]  Chief Complaint  Patient presents with   Hypertension    Pt is here to go over chronic issues    Discussed the use of AI scribe software for clinical note transcription with the patient, who gave verbal consent to proceed.  History of Present Illness Claudia [name] is a 73 year old female who presents for a follow-up visit for multiple chronic conditions.  She is being followed for hypertension, prediabetes, hyperlipidemia, osteoporosis, GERD, SVT, AFib, and a thoracic aneurysm. No new surgeries have occurred since her last visit.  For hypertension, she continues to take valsartan  HCTZ 320/25 mg. Her blood pressure at home typically runs around 136/70 mmHg.  Regarding SVT and AFib, she takes metoprolol  half a tablet daily and continues on Eliquis . [facility] and was informed she no longer needs to return unless new heart problems develop. No abnormal leg swelling, chest pain, shortness of breath, or cough.  For hyperlipidemia, she is on pravastatin  20 mg and is doing well with this medication. Her last cholesterol check was six months ago.  She manages her GERD with pantoprazole  40 mg and has no breakthrough heartburn, food getting stuck, or dark stools.  She is taking 8000 IU of vitamin D  daily for osteoporosis. She cannot recall the last time she had a bone density scan, but it may have been in 2018.  She has started taking prenatal vitamins with iron to address hair loss and improve her iron levels.  Her thoracic aneurysm is stable with no growth noted on the last scan.  She is still taking oxybutynin  for overactive bladder.  She mentions being more active recently due to moving and settling into a new home, although her knees limit her ability to exercise regularly.    Health Maintenance Due  Topic Date Due    Colonoscopy  11/04/2022   Bone Density Scan  05/09/2023    Past Medical History:  Diagnosis Date   Achilles tendonitis    R heal, limits activity   Allergy    Anxiety    Arthritis    Elevated hemoglobin A1c    GERD (gastroesophageal reflux disease)    Heart murmur    slight   Hyperlipidemia    Hypertension    pulmonary   IBS (irritable bowel syndrome)    Insomnia    Obesity    Osteoporosis 5 Years ago?   Palpitations    Pneumonia    as a child   Pulmonary HTN (HCC)    SVT (supraventricular tachycardia) 10/29/2013   Thoracic aortic aneurysm without rupture    Vitamin D  deficiency     Past Surgical History:  Procedure Laterality Date   ACHILLES TENDON SURGERY Right 06/10/2023   Procedure: RIGHT HEEL ACHILLES TENDON REPAIR AND SPUR EXCISION;  Surgeon: Sheril Coy, MD;  Location: [facility];  Service: Orthopedics;  Laterality: Right;   CESAREAN SECTION     CHOLECYSTECTOMY  10 years ago   RIGHT HEART CATH N/A 02/01/2023   Procedure: RIGHT HEART CATH;  Surgeon: Gardenia Led, DO;  Location: [facility];  Service: Cardiovascular;  Laterality: N/A;   TUBAL LIGATION Bilateral      Current Outpatient Medications:    apixaban  (ELIQUIS ) 5 MG TABS tablet, TAKE 1 TABLET TWICE A DAY, Disp: 180 tablet, Rfl: 2   Ascorbic Acid (VITAMIN  C PO), Take 500 mg by mouth 2 (two) times daily., Disp: , Rfl:    Cholecalciferol (VITAMIN D -3 PO), Take 4,000 Units by mouth in the morning and at bedtime., Disp: , Rfl:    CINNAMON PO, Take 1,000 mg by mouth at bedtime., Disp: , Rfl:    diclofenac  Sodium (VOLTAREN ) 1 % GEL, Apply 4 g topically 4 (four) times daily as needed (pain)., Disp: , Rfl:    dicyclomine  (BENTYL ) 20 MG tablet, Take  1/2 to 1 tablet   4 x /day  before meals & Bedtime for Nausea, Cramping, Bloating or Diarrhea, Disp: 60 tablet, Rfl: 1   diphenhydrAMINE (BENADRYL) 25 mg capsule, Take 25 mg by mouth at bedtime., Disp: , Rfl:    HOMEOPATHIC PRODUCTS EX, Place 1 patch onto  the skin daily as needed (knee pain). Natural Knee Pain Patch-Herbal Knee Patches (Peppermint/Capsicum/Camphor/Ginger/Safflower/[place] extracts) Applied knee/heel., Disp: , Rfl:    lidocaine  (LIDODERM ) 5 %, Place 1 patch onto the skin daily as needed (pain.)., Disp: , Rfl:    loperamide (IMODIUM A-D) 2 MG tablet, Take 6 mg by mouth in the morning., Disp: , Rfl:    Menaquinone-7 (VITAMIN K2 PO), Take 100 mcg by mouth in the morning., Disp: , Rfl:    metoprolol  succinate (TOPROL -XL) 25 MG 24 hr tablet, TAKE ONE-HALF (1/2) TABLET DAILY, Disp: 45 tablet, Rfl: 3   Omega-3 Fatty Acids (FISH OIL) 1000 MG CAPS, Take 1,000 mg by mouth at bedtime., Disp: , Rfl:    pantoprazole  (PROTONIX ) 40 MG tablet, Take  1 tablet  Daily  to Prevent Heartburn &  Indigestion, Disp: 90 tablet, Rfl: 3   pravastatin  (PRAVACHOL ) 20 MG tablet, TAKE 1 TABLET DAILY, Disp: 90 tablet, Rfl: 3   Prenatal Vit-Fe Fumarate-FA (PRENATAL VITAMIN PLUS LOW IRON PO), Take 1 Capful by mouth daily., Disp: , Rfl:    valsartan -hydrochlorothiazide  (DIOVAN  HCT) 320-25 MG tablet, Take 1 tablet by mouth daily., Disp: 90 tablet, Rfl: 3   vitamin B-12 (CYANOCOBALAMIN ) 500 MCG tablet, Take 500 mcg by mouth in the morning., Disp: , Rfl:    ZINC GLUCONATE PO, Take 25 mg by mouth at bedtime., Disp: , Rfl:    oxybutynin  (DITROPAN -XL) 10 MG 24 hr tablet, Take  1 tablet  at Suppertime  for OverActive Bladder, Disp: 90 tablet, Rfl: 3  Allergies  Allergen Reactions   Amlodipine  Shortness Of Breath   Ciprofloxacin Other (See Comments)    Felt worse   Levaquin [Levofloxacin In D5w]     No fluroquinolones due to ascending thoracic aortic aneurysm   Penicillins Other (See Comments)    Had as a child=unknown reaction   Zoloft [Sertraline Hcl] Other (See Comments)    Felt bad   ROS neg/noncontributory except as noted HPI/below      Objective:     BP 136/78 (BP Location: Left Arm, Patient Position: Sitting, Cuff Size: Large)   Pulse (!) 54    Temp (!) 97.4 F (36.3 C) (Temporal)   Ht 5' 8 (1.727 m)   Wt 231 lb 6 oz (105 kg)   SpO2 98%   BMI 35.18 kg/m  Wt Readings from Last 3 Encounters:  03/14/24 231 lb 6 oz (105 kg)  02/23/24 229 lb 9.6 oz (104.1 kg)  11/22/23 222 lb (100.7 kg)    Physical Exam VITALS: BP- 136/70 GENERAL: Well developed, well nourished, no acute distress. HEAD EYES EARS NOSE THROAT: Normocephalic, atraumatic, conjunctiva not injected, sclera nonicteric. CARDIAC: brady Regular rate and rhythm, S1 S2  present, no murmur, dorsalis pedis 2 plus bilaterally NECK: Supple, no thyromegaly, no nodes, no carotid bruits. LUNGS: Clear to auscultation bilaterally, no wheezes. ABDOMEN: Bowel sounds present, soft, non-tender, non-distended, no hepatosplenomegaly, no masses. EXTREMITIES: No edema. MUSCULOSKELETAL: No gross abnormalities. NEUROLOGICAL: Alert and oriented x3, cranial nerves II through XII intact. PSYCHIATRIC: Normal mood, good eye contact.       Assessment & Plan:  Essential hypertension  Pulmonary HTN (HCC)  Mixed hyperlipidemia  Persistent atrial fibrillation (HCC)  Aneurysm of ascending aorta without rupture  Gastroesophageal reflux disease without esophagitis  Age-related osteoporosis without current pathological fracture -     DG Bone Density; Future  Low iron stores -     CBC with Differential/Platelet -     IBC + Ferritin -     Vitamin B12  Prediabetes -     Comprehensive metabolic panel with GFR -     Hemoglobin A1c  OAB (overactive bladder) -     oxyBUTYnin  Chloride ER; Take  1 tablet  at Suppertime  for OverActive Bladder  Dispense: 90 tablet; Refill: 3  Vitamin D  deficiency -     VITAMIN D  25 Hydroxy (Vit-D Deficiency, Fractures)  Immunization due -     Flu vaccine HIGH DOSE PF(Fluzone Trivalent)  High risk medication use -     Comprehensive metabolic panel with GFR -     Magnesium    Assessment and Plan Assessment & Plan Essential hypertension   Blood  pressure is slightly elevated today, likely due to rushing to the appointment. Home readings are typically around 136/70 mmHg, which is acceptable. Continue valsartan  HCTZ 320/25 mg daily.  Prediabetes   Work on diet/exercise  Mixed hyperlipidemia   Currently managed with pravastatin  20 mg. Cholesterol levels were last checked six months ago and are stable. Continue pravastatin  20 mg daily. Repeat cholesterol panel in six months.  Age-related osteoporosis   Vitamin D  supplementation is ongoing, but the exact dosage is unclear. The last bone density scan was in 2018. Ordered bone density scan. Continue vitamin D  supplementation.  Gastroesophageal reflux disease   Managed with pantoprazole  40 mg. No breakthrough heartburn or other symptoms reported. Continue pantoprazole  40 mg daily.  Persistent atrial fibrillation   Managed with metoprolol  and Eliquis . No recent episodes of chest pain, shortness of breath, or other symptoms. Cardiologist follow-up is no longer required unless new heart problems develop. Continue metoprolol  and Eliquis  as prescribed.  Aneurysm of ascending aorta   No growth noted on the last scan.  Vitamin D  deficiency   Currently taking 8000 IU of vitamin D  daily. Vitamin D  levels have not been checked recently. Ordered vitamin D  level.  Low iron stores   Taking prenatal vitamins with iron to address low iron stores and hair loss. Ordered iron level.  Overactive bladder   Managed with oxybutynin . Prescription needs to be refilled. Refilled oxybutynin  prescription.  General Health Maintenance   Due for a flu shot and mammogram. Bone density scan is also due. Administered flu shot. Ordered mammogram and bone density scan.     Return in about 6 months (around 09/12/2024) for chronic follow-up.  Jenkins CHRISTELLA Carrel, MD

## 2024-03-15 ENCOUNTER — Ambulatory Visit: Payer: Self-pay | Admitting: Family Medicine

## 2024-03-15 NOTE — Progress Notes (Signed)
Labs great

## 2024-03-16 NOTE — Progress Notes (Signed)
 Pt has read results [facility]

## 2024-05-09 ENCOUNTER — Other Ambulatory Visit: Payer: Self-pay | Admitting: Cardiovascular Disease

## 2024-05-11 MED ORDER — PRAVASTATIN SODIUM 20 MG PO TABS: 20.0000 mg | ORAL_TABLET | Freq: Every day | ORAL | 1 refills | Status: AC

## 2024-05-11 NOTE — Addendum Note (Signed)
 Addended by: BLUFORD RAMP D on: 05/11/2024 02:57 PM   Modules accepted: Orders

## 2024-05-11 NOTE — Telephone Encounter (Signed)
 Pt had a lipid panel done on 08/25/2023 within 12 months

## 2024-09-05 ENCOUNTER — Ambulatory Visit: Admitting: Cardiovascular Disease

## 2024-09-12 ENCOUNTER — Ambulatory Visit: Admitting: Family Medicine

## 2024-11-26 ENCOUNTER — Ambulatory Visit
